# Patient Record
Sex: Male | Born: 1935 | Race: White | Hispanic: No | Marital: Single | Smoking: Never smoker
Health system: Southern US, Community
[De-identification: ages and names within clinical notes are randomized; demographics above are authoritative.]

## PROBLEM LIST (undated history)

## (undated) DIAGNOSIS — Z950 Presence of cardiac pacemaker: Secondary | ICD-10-CM

## (undated) DIAGNOSIS — R7989 Other specified abnormal findings of blood chemistry: Secondary | ICD-10-CM

## (undated) DIAGNOSIS — K4090 Unilateral inguinal hernia, without obstruction or gangrene, not specified as recurrent: Secondary | ICD-10-CM

## (undated) DIAGNOSIS — S72009A Fracture of unspecified part of neck of unspecified femur, initial encounter for closed fracture: Secondary | ICD-10-CM

## (undated) DIAGNOSIS — I442 Atrioventricular block, complete: Secondary | ICD-10-CM

## (undated) DIAGNOSIS — M47816 Spondylosis without myelopathy or radiculopathy, lumbar region: Secondary | ICD-10-CM

## (undated) DIAGNOSIS — D649 Anemia, unspecified: Secondary | ICD-10-CM

## (undated) DIAGNOSIS — I10 Essential (primary) hypertension: Secondary | ICD-10-CM

## (undated) DIAGNOSIS — N390 Urinary tract infection, site not specified: Secondary | ICD-10-CM

## (undated) DIAGNOSIS — J189 Pneumonia, unspecified organism: Secondary | ICD-10-CM

## (undated) DIAGNOSIS — M1990 Unspecified osteoarthritis, unspecified site: Secondary | ICD-10-CM

## (undated) DIAGNOSIS — R55 Syncope and collapse: Secondary | ICD-10-CM

## (undated) DIAGNOSIS — J449 Chronic obstructive pulmonary disease, unspecified: Secondary | ICD-10-CM

## (undated) DIAGNOSIS — F0390 Unspecified dementia without behavioral disturbance: Secondary | ICD-10-CM

## (undated) DIAGNOSIS — R251 Tremor, unspecified: Secondary | ICD-10-CM

## (undated) DIAGNOSIS — H9190 Unspecified hearing loss, unspecified ear: Secondary | ICD-10-CM

## (undated) DIAGNOSIS — N400 Enlarged prostate without lower urinary tract symptoms: Secondary | ICD-10-CM

## (undated) DIAGNOSIS — K922 Gastrointestinal hemorrhage, unspecified: Secondary | ICD-10-CM

## (undated) HISTORY — DX: Fracture of unspecified part of neck of unspecified femur, initial encounter for closed fracture: S72.009A

## (undated) HISTORY — DX: Presence of cardiac pacemaker: Z95.0

## (undated) HISTORY — DX: Syncope and collapse: R55

## (undated) HISTORY — DX: Pneumonia, unspecified organism: J18.9

## (undated) HISTORY — DX: Unspecified osteoarthritis, unspecified site: M19.90

## (undated) HISTORY — DX: Tremor, unspecified: R25.1

## (undated) HISTORY — DX: Atrioventricular block, complete: I44.2

## (undated) HISTORY — DX: Other specified abnormal findings of blood chemistry: R79.89

## (undated) HISTORY — DX: Benign prostatic hyperplasia without lower urinary tract symptoms: N40.0

## (undated) HISTORY — PX: PACEMAKER INSERTION: SHX728

## (undated) HISTORY — DX: Gastrointestinal hemorrhage, unspecified: K92.2

## (undated) HISTORY — DX: Urinary tract infection, site not specified: N39.0

## (undated) HISTORY — PX: OTHER SURGICAL HISTORY: SHX169

## (undated) HISTORY — DX: Anemia, unspecified: D64.9

## (undated) HISTORY — DX: Essential (primary) hypertension: I10

## (undated) HISTORY — DX: Chronic obstructive pulmonary disease, unspecified: J44.9

## (undated) HISTORY — DX: Spondylosis without myelopathy or radiculopathy, lumbar region: M47.816

## (undated) HISTORY — DX: Unspecified dementia, unspecified severity, without behavioral disturbance, psychotic disturbance, mood disturbance, and anxiety: F03.90

## (undated) HISTORY — PX: HERNIA REPAIR: SHX51

---

## 1986-01-16 HISTORY — PX: CHOLECYSTECTOMY: SHX55

## 1988-01-17 HISTORY — PX: OTHER SURGICAL HISTORY: SHX169

## 1995-04-17 DIAGNOSIS — J449 Chronic obstructive pulmonary disease, unspecified: Secondary | ICD-10-CM

## 1995-04-17 DIAGNOSIS — J189 Pneumonia, unspecified organism: Secondary | ICD-10-CM

## 1995-04-17 DIAGNOSIS — D649 Anemia, unspecified: Secondary | ICD-10-CM

## 1995-04-17 DIAGNOSIS — M47816 Spondylosis without myelopathy or radiculopathy, lumbar region: Secondary | ICD-10-CM

## 1995-04-17 DIAGNOSIS — M1990 Unspecified osteoarthritis, unspecified site: Secondary | ICD-10-CM

## 1995-04-17 HISTORY — DX: Spondylosis without myelopathy or radiculopathy, lumbar region: M47.816

## 1995-04-17 HISTORY — PX: OTHER SURGICAL HISTORY: SHX169

## 1995-04-17 HISTORY — DX: Pneumonia, unspecified organism: J18.9

## 1995-04-17 HISTORY — DX: Anemia, unspecified: D64.9

## 1995-04-17 HISTORY — DX: Unspecified osteoarthritis, unspecified site: M19.90

## 1995-04-17 HISTORY — PX: VENTRAL HERNIA REPAIR: SHX424

## 1995-04-17 HISTORY — PX: PULMONARY EMBOLISM SURGERY: SHX752

## 1995-04-17 HISTORY — DX: Chronic obstructive pulmonary disease, unspecified: J44.9

## 1995-05-17 DIAGNOSIS — N390 Urinary tract infection, site not specified: Secondary | ICD-10-CM

## 1995-05-17 HISTORY — PX: OTHER SURGICAL HISTORY: SHX169

## 1995-05-17 HISTORY — DX: Urinary tract infection, site not specified: N39.0

## 1999-11-17 ENCOUNTER — Emergency Department (HOSPITAL_COMMUNITY): Admission: EM | Admit: 1999-11-17 | Discharge: 1999-11-17 | Payer: Self-pay | Admitting: Emergency Medicine

## 1999-11-17 ENCOUNTER — Encounter: Payer: Self-pay | Admitting: Emergency Medicine

## 2003-12-14 ENCOUNTER — Ambulatory Visit: Payer: Self-pay | Admitting: Internal Medicine

## 2004-12-31 ENCOUNTER — Emergency Department (HOSPITAL_COMMUNITY): Admission: EM | Admit: 2004-12-31 | Discharge: 2004-12-31 | Payer: Self-pay | Admitting: Family Medicine

## 2005-01-02 ENCOUNTER — Emergency Department (HOSPITAL_COMMUNITY): Admission: AD | Admit: 2005-01-02 | Discharge: 2005-01-02 | Payer: Self-pay | Admitting: Family Medicine

## 2006-04-27 ENCOUNTER — Ambulatory Visit: Payer: Self-pay | Admitting: Family Medicine

## 2006-07-26 ENCOUNTER — Encounter: Payer: Self-pay | Admitting: Family Medicine

## 2006-07-26 DIAGNOSIS — Z86718 Personal history of other venous thrombosis and embolism: Secondary | ICD-10-CM

## 2006-07-26 DIAGNOSIS — J449 Chronic obstructive pulmonary disease, unspecified: Secondary | ICD-10-CM

## 2006-07-27 ENCOUNTER — Ambulatory Visit: Payer: Self-pay | Admitting: Family Medicine

## 2006-07-27 DIAGNOSIS — D485 Neoplasm of uncertain behavior of skin: Secondary | ICD-10-CM

## 2006-07-30 ENCOUNTER — Ambulatory Visit: Payer: Self-pay | Admitting: Internal Medicine

## 2006-07-30 LAB — CONVERTED CEMR LAB
Bilirubin Urine: NEGATIVE
Glucose, Urine, Semiquant: NEGATIVE
Ketones, urine, test strip: NEGATIVE
Specific Gravity, Urine: 1.015
Urobilinogen, UA: NEGATIVE

## 2006-08-21 ENCOUNTER — Ambulatory Visit: Payer: Self-pay | Admitting: Family Medicine

## 2006-08-30 ENCOUNTER — Encounter: Payer: Self-pay | Admitting: Family Medicine

## 2007-06-19 ENCOUNTER — Ambulatory Visit: Payer: Self-pay | Admitting: Family Medicine

## 2007-06-19 DIAGNOSIS — L923 Foreign body granuloma of the skin and subcutaneous tissue: Secondary | ICD-10-CM

## 2007-06-24 ENCOUNTER — Ambulatory Visit: Payer: Self-pay | Admitting: Family Medicine

## 2007-07-18 ENCOUNTER — Encounter: Payer: Self-pay | Admitting: Family Medicine

## 2007-09-26 ENCOUNTER — Ambulatory Visit: Payer: Self-pay | Admitting: *Deleted

## 2007-09-26 ENCOUNTER — Ambulatory Visit: Payer: Self-pay | Admitting: Internal Medicine

## 2007-09-26 ENCOUNTER — Inpatient Hospital Stay (HOSPITAL_COMMUNITY): Admission: EM | Admit: 2007-09-26 | Discharge: 2007-09-30 | Payer: Self-pay | Admitting: Emergency Medicine

## 2007-09-26 DIAGNOSIS — F0390 Unspecified dementia without behavioral disturbance: Secondary | ICD-10-CM | POA: Insufficient documentation

## 2007-09-26 DIAGNOSIS — I10 Essential (primary) hypertension: Secondary | ICD-10-CM

## 2007-09-27 ENCOUNTER — Encounter: Payer: Self-pay | Admitting: Family Medicine

## 2007-09-27 ENCOUNTER — Ambulatory Visit: Payer: Self-pay | Admitting: Vascular Surgery

## 2007-09-27 ENCOUNTER — Encounter: Payer: Self-pay | Admitting: Internal Medicine

## 2007-09-27 HISTORY — PX: OTHER SURGICAL HISTORY: SHX169

## 2007-09-30 ENCOUNTER — Encounter: Payer: Self-pay | Admitting: Family Medicine

## 2007-10-04 ENCOUNTER — Encounter: Payer: Self-pay | Admitting: Family Medicine

## 2007-10-07 ENCOUNTER — Encounter: Payer: Self-pay | Admitting: Family Medicine

## 2007-10-08 ENCOUNTER — Ambulatory Visit: Payer: Self-pay | Admitting: Family Medicine

## 2007-10-14 ENCOUNTER — Encounter: Payer: Self-pay | Admitting: Family Medicine

## 2007-10-17 ENCOUNTER — Ambulatory Visit: Payer: Self-pay

## 2007-10-28 ENCOUNTER — Ambulatory Visit: Payer: Self-pay

## 2007-12-24 ENCOUNTER — Ambulatory Visit: Payer: Self-pay | Admitting: Internal Medicine

## 2008-01-07 ENCOUNTER — Ambulatory Visit: Payer: Self-pay | Admitting: Family Medicine

## 2008-01-07 DIAGNOSIS — R609 Edema, unspecified: Secondary | ICD-10-CM | POA: Insufficient documentation

## 2008-01-07 DIAGNOSIS — N401 Enlarged prostate with lower urinary tract symptoms: Secondary | ICD-10-CM

## 2008-01-07 DIAGNOSIS — G4700 Insomnia, unspecified: Secondary | ICD-10-CM | POA: Insufficient documentation

## 2008-01-08 ENCOUNTER — Ambulatory Visit: Payer: Self-pay | Admitting: Family Medicine

## 2008-01-08 DIAGNOSIS — E7800 Pure hypercholesterolemia, unspecified: Secondary | ICD-10-CM

## 2008-01-08 DIAGNOSIS — M109 Gout, unspecified: Secondary | ICD-10-CM | POA: Insufficient documentation

## 2008-01-08 LAB — CONVERTED CEMR LAB
Alkaline Phosphatase: 98 units/L (ref 39–117)
Basophils Absolute: 0 10*3/uL (ref 0.0–0.1)
Bilirubin, Direct: 0.1 mg/dL (ref 0.0–0.3)
Calcium: 9.9 mg/dL (ref 8.4–10.5)
Cholesterol: 208 mg/dL (ref 0–200)
GFR calc Af Amer: 103 mL/min
Glucose, Bld: 97 mg/dL (ref 70–99)
HCT: 46.3 % (ref 39.0–52.0)
Hemoglobin: 16.2 g/dL (ref 13.0–17.0)
Lymphocytes Relative: 21.9 % (ref 12.0–46.0)
MCHC: 34.9 g/dL (ref 30.0–36.0)
Monocytes Absolute: 0.9 10*3/uL (ref 0.1–1.0)
Monocytes Relative: 10.1 % (ref 3.0–12.0)
Neutro Abs: 5.6 10*3/uL (ref 1.4–7.7)
PSA: 7.27 ng/mL — ABNORMAL HIGH (ref 0.10–4.00)
Platelets: 242 10*3/uL (ref 150–400)
Potassium: 5.2 meq/L — ABNORMAL HIGH (ref 3.5–5.1)
RDW: 13.7 % (ref 11.5–14.6)
Sodium: 140 meq/L (ref 135–145)
TSH: 3.71 microintl units/mL (ref 0.35–5.50)
Total Bilirubin: 1.2 mg/dL (ref 0.3–1.2)
Total CHOL/HDL Ratio: 6.2
Triglycerides: 164 mg/dL — ABNORMAL HIGH (ref 0–149)
Uric Acid, Serum: 6.4 mg/dL (ref 4.0–7.8)
VLDL: 33 mg/dL (ref 0–40)

## 2008-01-30 ENCOUNTER — Ambulatory Visit: Payer: Self-pay | Admitting: Family Medicine

## 2008-01-30 LAB — CONVERTED CEMR LAB
BUN: 20 mg/dL (ref 6–23)
Chloride: 105 meq/L (ref 96–112)
GFR calc Af Amer: 91 mL/min
GFR calc non Af Amer: 75 mL/min
Potassium: 4.2 meq/L (ref 3.5–5.1)
Sodium: 142 meq/L (ref 135–145)

## 2008-02-06 ENCOUNTER — Ambulatory Visit: Payer: Self-pay | Admitting: Family Medicine

## 2008-02-17 ENCOUNTER — Telehealth: Payer: Self-pay | Admitting: Family Medicine

## 2008-02-26 ENCOUNTER — Encounter: Payer: Self-pay | Admitting: Internal Medicine

## 2008-05-07 ENCOUNTER — Ambulatory Visit: Payer: Self-pay | Admitting: Family Medicine

## 2008-05-07 DIAGNOSIS — M79609 Pain in unspecified limb: Secondary | ICD-10-CM

## 2008-09-28 ENCOUNTER — Encounter: Payer: Self-pay | Admitting: Internal Medicine

## 2008-09-28 ENCOUNTER — Ambulatory Visit: Payer: Self-pay

## 2008-11-03 ENCOUNTER — Ambulatory Visit: Payer: Self-pay | Admitting: Internal Medicine

## 2008-11-03 DIAGNOSIS — I472 Ventricular tachycardia, unspecified: Secondary | ICD-10-CM | POA: Insufficient documentation

## 2008-12-07 ENCOUNTER — Ambulatory Visit: Payer: Self-pay | Admitting: Family Medicine

## 2009-05-13 ENCOUNTER — Telehealth: Payer: Self-pay | Admitting: Family Medicine

## 2009-06-15 ENCOUNTER — Telehealth: Payer: Self-pay | Admitting: Family Medicine

## 2009-07-20 ENCOUNTER — Encounter (INDEPENDENT_AMBULATORY_CARE_PROVIDER_SITE_OTHER): Payer: Self-pay | Admitting: *Deleted

## 2009-07-20 ENCOUNTER — Telehealth: Payer: Self-pay | Admitting: Family Medicine

## 2009-08-19 ENCOUNTER — Encounter (INDEPENDENT_AMBULATORY_CARE_PROVIDER_SITE_OTHER): Payer: Self-pay | Admitting: *Deleted

## 2009-08-30 ENCOUNTER — Ambulatory Visit: Payer: Self-pay | Admitting: Family Medicine

## 2009-08-30 DIAGNOSIS — R259 Unspecified abnormal involuntary movements: Secondary | ICD-10-CM | POA: Insufficient documentation

## 2010-02-02 ENCOUNTER — Ambulatory Visit
Admission: RE | Admit: 2010-02-02 | Discharge: 2010-02-02 | Payer: Self-pay | Source: Home / Self Care | Attending: Internal Medicine | Admitting: Internal Medicine

## 2010-02-02 ENCOUNTER — Encounter: Payer: Self-pay | Admitting: Internal Medicine

## 2010-02-15 NOTE — Progress Notes (Signed)
Summary: refill request for finasteride  Phone Note Refill Request Message from:  Fax from Pharmacy  Refills Requested: Medication #1:  PROSCAR 5 MG TABS one tab by mouth once daily   Last Refilled: 02/10/2009 Faxed request from [place].  Initial call taken by: Lowella Petties CMA,  May 13, 2009 5:05 PM    Prescriptions: PROSCAR 5 MG TABS (FINASTERIDE) one tab by mouth once daily  #30 x 12   Entered and Authorized by:   Shaune Leeks MD   Signed by:   Shaune Leeks MD on 05/13/2009   Method used:   Electronically to        [facility]* (retail)       [address]       [place], [place]  [address]       Ph: [phone]       Fax: [phone]   RxID:   [id number]

## 2010-02-15 NOTE — Letter (Signed)
Summary: Nadara Eaton letter  [place] at [facility]  [address] [place], [place] [address]   Phone: [phone]  Fax: [phone]       08/19/2009 MRN: [id number]  JOHNRYAN SAO [address] [place], [place]  [address]  Dear Mr. KRAS,  [facility], and [facility] announce the retirement of Arta Silence, M.D., from full-time practice at the [facility] office effective July 15, 2009 and his plans of returning part-time.  It is important to Dr. Hetty Ely and to our practice that you understand that [facility] has seven physicians in our office for your health care needs.  We will continue to offer the same exceptional care that you have today.    Dr. Hetty Ely has spoken to many of you about his plans for retirement and returning part-time in the fall.   We will continue to work with you through the transition to schedule appointments for you in the office and meet the high standards that [facility] is committed to.   Again, it is with great pleasure that we share the news that Dr. Hetty Ely will return to [facility] in October of 2011 with a reduced schedule.    If you have any questions, or would like to request an appointment with one of our physicians, please call us at [phone] and press the option for Scheduling an appointment.  We take pleasure in providing you with excellent patient care and look forward to seeing you at your next office visit.  Our [facility] Physicians are:  Tillman Abide, M.D. Laurita Quint, M.D. Roxy Manns, M.D. Kerby Nora, M.D. Hannah Beat, M.D. Ruthe Mannan, M.D. We proudly welcomed Raechel Ache, M.D. and Eustaquio Boyden, M.D. to the practice in July/August 2011.  Sincerely,  [facility]

## 2010-02-15 NOTE — Letter (Signed)
Summary: Generic Letter  [place] at [facility]  [address] [place], [place] [address]   Phone: [phone]  Fax: [phone]    07/20/2009    LARIN WEISSBERG [address] [place], [place]  [address]    Dear Mr. TRAMELL,  We have received requests for your medication to be filled from your pharmacy.  You must schedule an appointment before further refills can be authorized.  Please call [phone] for an appointment.  Your medication has been refilled for the next 30 days only.   Sincerely,   Lugene Fuquay CMA (AAMA)

## 2010-02-15 NOTE — Assessment & Plan Note (Signed)
Summary: Seth Hunter FROM [facility]   Vital Signs:  Patient profile:   77 year old male Height:      71 inches Weight:      187 pounds BMI:     26.18 Temp:     97.8 degrees F oral Pulse rate:   84 / minute Pulse rhythm:   regular BP sitting:   132 / 88  (left arm) Cuff size:   regular  Vitals Entered By: Delilah Shan CMA Kayan Blissett Dull) (August 30, 2009 11:31 AM) CC: Transfer from [facility]   History of Present Illness: Edema.  Controlled with HCTZ.  Doing well w/o complaints.  compliant.  Dec in edema on med.  BPH. UOP improved on meds w/o adverse effect.  D/w patient today about checking PSA.  Given age, I would not continue to check this.  He (and family members) understood.   Allergies: No Known Drug Allergies  Past History:  Past Medical History: COPD: 04/1995  Syncope in the setting of complete heart block status post permanent pacemaker placement on September 27, 2007. Medtronic Versa [id number] Hypertension Dementia- thought this appears to be mild based on reports of family as of 8/11 BPH  Family History: Reviewed history from 07/26/2006 and no changes required. Father: dead "hardening of the arteries", "heart trouble." Mother:  dead at 66, "old age"  Social History: Reviewed history and no changes required. Lives alone, doesn't drive,  Niece helps with that.  Does his own cooking, cleaning.   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  No kids.   Seth Hunter  Physical Exam  General:  GEN: nad, alert and oriented, hard of hearing HEENT: mucous membranes moist NECK: supple w/o LA CV: rrr. pacer in place on L chest wall PULM: ctab, no inc wob ABD: soft, +bs EXT: no edema SKIN: no acute rash    Impression & Recommendations:  Problem # 1:  EDEMA (ICD-782.3)  No change in meds.  Return for labs.   His updated medication list for this problem includes:    Hydrochlorothiazide 12.5 Mg Tabs (Hydrochlorothiazide) ..... One tab by mouth in am  Orders: Prescription Created Electronically [phone])  Problem  # 2:  BENIGN PROSTATIC HYPERTROPHY, WITH OBSTRUCTION (ICD-600.01)  Doing well. I would not check PSA at this point.  No change in meds.    Orders: Prescription Created Electronically [phone])  Complete Medication List: 1)  Garlic 400 Mg Tbec (Garlic) .Marland Kitchen.. 1 daily by mouth 2)  Adult Aspirin Low Strength 81 Mg Tbdp (Aspirin) .Marland Kitchen.. 1 daily by mouth 3)  Hydrochlorothiazide 12.5 Mg Tabs (Hydrochlorothiazide) .... One tab by mouth in am 4)  Proscar 5 Mg Tabs (Finasteride) .... One tab by mouth once daily 5)  Voltaren 1 % Gel (Diclofenac sodium) .... Apply to hands two times a day as needed fo arthritic pai.  Patient Instructions: 1)  Return for fasting labs.   2)  BMET, lipid--- 782.3 3)  We'll contact you with your lab report.  4)  Take care.  Prescriptions: PROSCAR 5 MG TABS (FINASTERIDE) one tab by mouth once daily  #90 x 3   Entered and Authorized by:   Crawford Givens MD   Signed by:   Crawford Givens MD on 08/30/2009   Method used:   Electronically to        [facility]* (retail)       [address]       [place], [place]  [address]       Ph: [phone]       Fax: [phone]  RxID:   [id number] HYDROCHLOROTHIAZIDE 12.5 MG TABS (HYDROCHLOROTHIAZIDE) one tab by mouth in AM  #90 x 3   Entered and Authorized by:   Crawford Givens MD   Signed by:   Crawford Givens MD on 08/30/2009   Method used:   Electronically to        [facility]* (retail)       [address]       [place], [place]  [address]       Ph: [phone]       Fax: [phone]   RxID:   [id number]   Current Allergies (reviewed today): No known allergies   Appended Document: [facility]    Clinical Lists Changes  Problems: Added new problem of TREMOR (ICD-781.0) Assessed TREMOR as comment only - Pt is not bothered enough by tremor to start meds.  This has been going on for years per patient.  He does have a family history of what sounds to be benign familiar tremor.  I would observe this  and treat if progressive or debilitating. Observations: Added new observation of PEADULT: Crawford Givens MD ~General`Gen appear (08/30/2009 13:51) Added new observation of GEN APPEAR: tremor noted during exam.  It fluctuates.  More pronounced on bilateral upper extremities compared to bilateral lower extremities.  Some truncal movement appreciated.  No cogwheeling.   (08/30/2009 13:51) Added new observation of PAST MED HX: COPD: 04/1995  Syncope in the setting of complete heart block status post permanent pacemaker placement on September 27, 2007. Medtronic Versa [id number] Hypertension Dementia- thought this appears to be mild based on reports of family as of 8/11 BPH tremor (08/30/2009 13:51)       Past History:  Past Medical History: COPD: 04/1995  Syncope in the setting of complete heart block status post permanent pacemaker placement on September 27, 2007. Medtronic Versa [id number] Hypertension Dementia- thought this appears to be mild based on reports of family as of 8/11 BPH tremor   Physical Exam  General:  tremor noted during exam.  It fluctuates.  More pronounced on bilateral upper extremities compared to bilateral lower extremities.  Some truncal movement appreciated.  No cogwheeling.     Impression & Recommendations:  Problem # 1:  TREMOR (ICD-781.0) Pt is not bothered enough by tremor to start meds.  This has been going on for years per patient.  He does have a family history of what sounds to be benign familiar tremor.  I would observe this and treat if progressive or debilitating.   Complete Medication List: 1)  Garlic 400 Mg Tbec (Garlic) .Marland Kitchen.. 1 daily by mouth 2)  Adult Aspirin Low Strength 81 Mg Tbdp (Aspirin) .Marland Kitchen.. 1 daily by mouth 3)  Hydrochlorothiazide 12.5 Mg Tabs (Hydrochlorothiazide) .... One tab by mouth in am 4)  Proscar 5 Mg Tabs (Finasteride) .... One tab by mouth once daily 5)  Voltaren 1 % Gel (Diclofenac sodium) .... Apply to hands two times a day as  needed fo arthritic pai.

## 2010-02-15 NOTE — Progress Notes (Signed)
Summary: Rx HCTZ  Phone Note Refill Request Call back at [phone] Message from:  [facility] on July 20, 2009 1:54 PM  Refills Requested: Medication #1:  HYDROCHLOROTHIAZIDE 12.5 MG TABS one tab by mouth in AM   Last Refilled: 06/15/2009 Patient has not been seen in over a year. Patient was notified that he needs office visit and lab appt for further refills. No appt. scheduled.   Method Requested: Electronic Initial call taken by: Sydell Axon LPN,  July 21, 4538 1:55 PM  Follow-up for Phone Call        If patient is scheduled for appointment, please fill rx up to that point.  Follow-up by: Crawford Givens MD,  July 20, 2009 2:16 PM  Additional Follow-up for Phone Call Additional follow up Details #1::        Left message on voicemail again to please schedule appointment.  Letter mailed also. Additional Follow-up by: Delilah Shan CMA (AAMA),  July 20, 2009 2:25 PM

## 2010-02-15 NOTE — Progress Notes (Signed)
Summary: HCTZ  Phone Note Refill Request Message from:  Fax from Pharmacy on Jun 15, 2009 10:36 AM  Refills Requested: Medication #1:  HYDROCHLOROTHIAZIDE 12.5 MG TABS one tab by mouth in AM Patient has not had an OV since April 2010.  Does he need OV?  [facility]   Method Requested: Electronic Initial call taken by: Delilah Shan CMA Duncan Dull),  Jun 15, 2009 10:36 AM  Follow-up for Phone Call        Yes. Can give one month with the understanding he needs to be seen and needs labwork as well. Follow-up by: Shaune Leeks MD,  Jun 15, 2009 1:23 PM  Additional Follow-up for Phone Call Additional follow up Details #1::        Left message on voicemail in detail.  Personalized VM.  Medication phoned to pharmacy.  Additional Follow-up by: Delilah Shan CMA Duncan Dull),  Jun 15, 2009 3:23 PM

## 2010-02-17 NOTE — Assessment & Plan Note (Signed)
Summary: rov. appt is 10:45. g   Visit Type:  PPM-Medtronic Primary Provider:  Hetty Ely  CC:  a lot of joint pain and and some SOB.  History of Present Illness: Mr. Seth Hunter is seen in followup syncope in the setting of complete heart block. 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 1 Daily By Mouth 2)  Adult Aspirin Low Strength 81 Mg  Tbdp (Aspirin) .Marland Kitchen.. 1 Daily By Mouth 3)  Hydrochlorothiazide 12.5 Mg Tabs (Hydrochlorothiazide) .... One Tab By Mouth in Am 4)  Proscar 5 Mg Tabs (Finasteride) .... One Tab By Mouth Once Daily 5)  Voltaren 1 % Gel (Diclofenac Sodium) .... Apply To Hands Two Times A Day As Needed Fo Arthritic Pai.  Allergies (verified): No Known Drug Allergies  Past History:  Past Medical History: Last updated: 09-01-2009 COPD: 04/1995  Syncope in the setting of complete heart block status post permanent pacemaker placement on September 27, 2007. Medtronic Versa [id number] Hypertension Dementia- thought this appears to be mild based on reports of family as of 8/11 BPH tremor  Past Surgical History: Last updated:  10/01/2007 CHOLECYSTECTOMY:(1988) LEFT INGUINAL HERNIA REPAIR (YEARS AGO) VENTRAL HERNIA REPAIR WITH INTRA ABD. 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 LEFT WRIST (1990) WITH OLD RIGHT POSTERIOR RIB FX. HOSP Syncope Comp Heart Block  Perm Pacer Placed Htn Dementia Mildly Elev TSH  9/10-9/14/2009 Pacer Placement (Dr [name]) 09/27/2007 CT Angio Chest No PE  No Acute Prob  09/27/07  Family History: Last updated: 09/01/09 Father: dead "hardening of the arteries", "heart trouble." Mother:  dead at 59, "old age"  Social History: Last updated: September 01, 2009 Lives alone, doesn't drive,  Niece helps with that.  Does his own cooking, cleaning.   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 HEENT Normal.  Neck veins were flat, carotids were brisk.  Lungs were clear.  Heart sounds were regular without murmurs or gallops.  Abdomen was soft with active bowel sounds. There is no clubbing cyanosis or edema. Skin Warm and dry woujnd well healed   PPM Specifications Following MD:  Sherryl Manges, MD     PPM Vendor:  Medtronic     PPM Model Number:   [id number]     PPM Serial Number:  [id number] PPM DOI:  09/27/2007     PPM Implanting MD:  Sherryl Manges, MD  Lead 1    Location: RA     DOI: 09/27/2007     Model #: [id number]     Serial #: [id number]     Status: active Lead 2    Location: RV     DOI: 09/27/2007     Model #: 6213     Serial #: [id number]     Status: active  Magnet Response Rate:  BOL 85 ERI  65  Indications:  Intermittent CHB; syncope  Explantation Comments:  Pacemaker dependent  PPM Follow Up Remote Check?  No Battery Voltage:  2.79 V     Battery Est. Longevity:  8.5 years     Pacer Dependent:  Yes       PPM Device Measurements Atrium  Amplitude: 5.6 mV, Impedance: 466 ohms, Threshold: 0.5 V at 0.4 msec Right Ventricle  Impedance: 455 ohms, Threshold: 1.0 V at 0.4 msec  Episodes MS Episodes:  5     Percent Mode Switch:  <0.1%     Coumadin:  No Ventricular High Rate:  1     Atrial Pacing:  20.7%     Ventricular Pacing:  97.9%  Parameters Mode:  DDDR     Lower Rate Limit:  60     Upper Rate Limit:  130 Paced AV Delay:  300     Sensed AV Delay:  300 Rate Response Parameters:  ADL response-3, Exertion response-3, Threshold-Med/Low, Acceleration-30 seconds, Deceleration-Exercise Next Cardiology Appt Due:  07/17/2010 Tech Comments:  No parameter changes.  Device function normal.  No Carelink @ this time. 1VHR episode 6 beats VT lasting 4 seconds.  ROV 6 months clinic. Altha Harm, LPN  February 02, 2010 11:32 AM   Impression & Recommendations:  Problem # 1:  VENTRICULAR TACHYCARDIA (ICD-427.1) stablr His updated medication list for this problem includes:    Adult Aspirin Low Strength 81 Mg Tbdp (Aspirin) .Marland Kitchen... 1 daily by mouth  Problem # 2:  AV BLOCK, COMPLETE DEVICE DEPENDENT (ICD-426.0) stabel with pacer in place. 100% v pacing His updated medication list for this problem includes:    Adult Aspirin Low Strength 81 Mg Tbdp (Aspirin) .Marland Kitchen... 1 daily by mouth  Problem # 3:  CARDIAC PACEMAKER-MEDTRONIC VERSA VEDR01  (ICD-V45.01) Device parameters and data were reviewed and no changes were made  Appended Document: rov. appt is 10:45. g    Clinical Lists Changes  Observations: Added new observation of PI CARDIO: Your physician recommends that you continue on your current medications as directed. Please refer to the Current Medication list given to you today. Your physician wants you to follow-up in: 6 months with Coumadin Clinic and 12 months with Dr. Graciela Husbands.    You will receive a reminder letter in the mail two months in advance. If you don't receive a letter, please call our office to schedule the follow-up appointment. (02/02/2010 12:01)       Patient Instructions: 1)  Your physician recommends that you continue on your current medications as directed. Please refer  to the Current Medication list given to you today. 2)  Your physician wants you to follow-up in: 6 months with Coumadin Clinic and 12 months with Dr. Graciela Husbands.    You will receive a reminder letter in the mail two months in advance. If you don't receive a letter, please call our office to schedule the follow-up appointment.

## 2010-02-17 NOTE — Cardiovascular Report (Signed)
Summary: Office Visit   Office Visit   Imported By: Roderic Ovens 02/07/2010 11:12:36  _____________________________________________________________________  External Attachment:    Type:   Image     Comment:   External Document

## 2010-05-31 NOTE — H&P (Signed)
NAME:  Seth Hunter, Seth Hunter NO.:  [id number]   MEDICAL RECORD NO.:  [id number]          PATIENT TYPE:  INP   LOCATION:  1843                         FACILITY:  [facility]   PHYSICIAN:  Michiel Cowboy, MDDATE OF BIRTH:  [birth year]   DATE OF ADMISSION:  09/26/2007  DATE OF DISCHARGE:                              HISTORY & PHYSICAL   PRIMARY CARE [place] Ke:  Arta Silence, MD   CHIEF COMPLAINT:  Syncope.   HISTORY OF PRESENT ILLNESS:  The patient is an 77 year old gentleman  with history of dementia and possibly hyperlipidemia, who was unable to  provide a full story for me.  No relatives at bedside.  He cannot recall  what was the last thing he remembers.  Apparently the patient has fallen  at home, which was unwitnessed.  At first he was noted to be poorly  responsive, but then quickly apparently came about.  They brought him to  the fire department, when it was noted that his heart rate was abnormal,  but not sure what and blood pressure also very elevated.  He was brought  in to the ED for further evaluation.  The patient denies any of this.  Cannot remember what happened.  Per family, he does not have any medical  problems, but takes aspirin and garlic.  In the emergency department he  was noted to be in mild heart failure, received Lasix with good  diuresis.  Of note, the patient likely suffered a head trauma when he  fell, because there is a scab on his head.  Unsure if this was secondary  to this fall a prior fall.   PAST MEDICAL HISTORY:  Dementia.   SOCIAL HISTORY:  The patient denies ever smoking, drinking.  Lives at  home, possibly alone although not sure since the family is not there   ALLERGIES:  TO PENICILLIN.   MEDICATIONS:  Aspirin 81 mg per day and garlic.   FAMILY HISTORY:  Noncontributory and unable to obtain.   PHYSICAL EXAMINATION:  VITAL SIGNS:  Temperature 98.1, blood pressure  155/87, pulse 97, respirations 15, satting 98% on room  air.  The patient  appears to be in no acute distress, sitting down on the bed.  HEAD:  There is a scab on the back of the head, but currently seems  nontender.  No edema noted around the lesion.  By the looks of it, this  could have happened a few days back.  NECK:  Supple.  No lymphadenopathy, no tenderness.  Moist mucous  membranes.  LUNGS:  Some crackles at the bases, but otherwise good air movement.  HEART:  Regular rate and rhythm.  No murmurs could be appreciated.  ABDOMEN:  There is some mild distention.  The patient unable to say if  he had any diarrhea or constipation.  LOWER EXTREMITIES:  No edema, but both feet are very cold.  No pulses  palpable in the feet.  No cyanosis, though,  noted.   LABS:  White blood cell count 7.5, hemoglobin 16, sodium 142, potassium  4.0, creatinine 0.9.  LFTs within normal limits, cardiac enzymes are  within normal limits.  BNP 111.  UA within normal limits.  EKG, showing  wide QRS.  Heart rate 77.  While on telemetry, it was noted that the  patient had occasional runs of heart rate down to 40s.   A CT scan of the head showing sinusitis and ischemic changes which are  old, but nothing acute.  CT scan of the neck showed degenerative  changes, but no vertebral fracture.  Chest x-ray showing bilateral  infiltrates/or edema, which is very mild.   ASSESSMENT AND PLAN:  This is a 77 year old gentleman with syncope.  1. Syncope.  Wonder if this is cardiogenic, as the patient does have      abnormal electrocardiogram.  Will put on telemetry, cycle cardiac      enzymes tried to assess risk factors with fasting lipid panel,      hemoglobin A1c.  Will check TSH.  Also the fact that the patient      was slightly bradycardic while I was examining him, makes me worry      if he has symptomatic bradycardia.  If this is documented on      telemetry overnight, would call cardiology consult and would      discuss this with family of the patient if a pacemaker  would be      something that they would consider.  Will follow BNP.  1. Possible congestive heart failure.  Will check a two-dimensional      echocardiogram, follow BNP, give gentle Lasix.  2. Possible frequent falls.  We will have physical      therapy/occupational therapy evaluation.  3. Prophylaxis:  Protonix plus Lovenox.  4. Questionable infiltrates.  For right now will hold off on      antibiotics as this could be possible edema and bilateral      infiltrates and the patient does not have a cough or white blood      cell count elevation.  Will repeat chest x-ray in a.m. once the      patient is fluid down, to see if we can further clarify this.  Dr.      Felicity Coyer to assume care in the morning.      Michiel Cowboy, MD  Electronically Signed     AVD/MEDQ  D:  09/26/2007  T:  09/26/2007  Job:  [id number]   cc:   Arta Silence, MD

## 2010-05-31 NOTE — Consult Note (Signed)
NAME:  Seth Hunter, Seth Hunter NO.:  [id number]   MEDICAL RECORD NO.:  [id number]          PATIENT TYPE:  INP   LOCATION:  4735                         FACILITY:  [facility]   PHYSICIAN:  Audery Amel, MD    DATE OF BIRTH:  September 24, 1918   DATE OF CONSULTATION:  09/27/2007  DATE OF DISCHARGE:                                 CONSULTATION   REASON FOR CONSULTATION:  Syncope.   HISTORY OF PRESENT ILLNESS:  Seth Hunter is an 77 year old white male with  a history of mild dementia who presented to the emergency department  this evening for further evaluation of syncopal symptoms.  The patient  is somewhat of a difficult historian.  However, he is alert and oriented  to his person, place and time.  Apparently, the patient fell this  evening while at home.  This was a witnessed event.  The patient did not  sustain any traumatic injury, and he was only transiently unaware of his  surroundings.  The patient was brought to [facility] for further  evaluation.  Initially in the emergency department, his EKG revealed  normal sinus rhythm with a nonspecific intraventricular conduction  block.  His initial cardiac biomarkers were negative times one.  Chest x-  ray did reveal bilateral pulmonary edema.  He was treated with diuresis.  He was seen by the hospitalist and admitted for further evaluation.  Early this a.m., approximately 3:30, he was noted to have bradycardia by  telemetry.  The heart rate in the 30s to 40s.  On my initial evaluation,  the patient was hemodynamically stable and asymptomatic.  An EKG was  obtained which revealed normal sinus rhythm with 2:1 Mobitz II AV block.  He denies any chest pain, shortness of breath or dyspnea on exertion.  He has bilateral lower extremity edema, graded at 1+, but denies any PND  or orthopnea.  He has occasional palpitations, but prior to this week,  had not experienced any presyncope or syncope symptoms.  The patient  does note an earlier this  week, on Tuesday while outside feeding his  cat, the patient again fell and has no recollection of event.  When he  came to, he was able to pick himself up and get back inside, but he did  not seek any medical attention at that time.  It sounds like the event  on Tuesday was very similar to the one that precipitated this admission.  Otherwise, he is without complaints.   PAST MEDICAL HISTORY:  1. Mild dementia.  2. GERD.  3. Questionable history of hyperlipidemia.   CURRENT MEDICATIONS:  1. Aspirin 81 mg daily.  2. Garlic tablets.   ALLERGIES:  NO KNOWN DRUG ALLERGIES.   SOCIAL HISTORY:  The patient was in [place] by himself.  He has  never been married.  He denies smoking, alcohol or any illicit  substances.   FAMILY HISTORY:  Noncontributory to his presentation.   REVIEW OF SYSTEMS:  As per HPI, otherwise complete review of systems was  negative except as documented.   PHYSICAL EXAMINATION:  VITAL  SIGNS:  Blood pressure 140/74, heart rate  is 40, O2 sats are 97% on 2 liters nasal cannula.  Temperature is 98.3.  GENERAL:  The patient is an elderly white male in no acute distress and  very pleasantly conversant.  He is alert and oriented to his person,  place, time.  HEENT:  Normocephalic, atraumatic.  EOMI, PERL, nares patent, OMP is  clear without erythema or exudate.  NECK:  Supple, full range of motion, no significant JVD.  His carotid  upstrokes are equal and symmetric bilaterally with no audible bruits.  No palpable thyromegaly or lymphadenopathy.  CHEST:  Bibasilar crackles.  CARDIOVASCULAR:  Brady S1-S2 with a 2/6 systolic murmur at the left  sternal border.  PMI is nonpalpable.  Peripheral pulses are 2+ and  symmetric.  ABDOMEN:  Soft, nontender, nondistended, positive bowel sounds.  No  hepatosplenomegaly.  EXTREMITIES:  Reveal 1+ bilateral lower extremity edema.  There is no  evidence of inflammation or ulceration.  NEUROLOGIC:  Grossly nonfocal.   Psychiatric appropriate insight and  judgment.   DATA REVIEWED:  EKG by my interpretation at 3:56 a.m. on September 11  reveals normal sinus rhythm with 2:1 AV block and evidence of an  interventricular conduction delay.  Suspect Mobitz II AV block.   LABORATORY DATA:  Troponin 0.02.  CK 83, CK-MB 2.3, BNP 111, sodium 138,  potassium 4.0, chloride 107, CO2 is 27, BUN 16, creatinine 0.9, glucose  99, white count 7.5, hematocrit 44.5, platelet count 249.   IMPRESSION:  1. Cardiac syncope secondary to the Mobitz II AV block.  2. Hypertension.  3. Mild dementia.   PLAN:  On examination of the patient's EKG early this morning, there is  evidence of 2:1 AV block.  Given that he does have evidence of  interventricular conduction delay, this suggests an infrahisian block  (Mobitz II).  He is currently asymptomatic and hemodynamically stable.  I recommend avoiding all AV nodal blocking agents at this time.  Given  that he is asymptomatic and hemodynamically stable, there is no  indication for an urgent transvenous pacemaker at this time.  He should  be continuously monitored on telemetry, and he will likely require a  permanent pacemaker placement prior to discharge.  Would recommend  checking a transthoracic echocardiogram to assess his left ventricular  structure and function.  Will continue aspirin 81 mg daily.  We  appreciate the opportunity to participate in the care of your patient.  Will follow along with you throughout this hospital course.  If there  are any additional questions, please feel free to contact us.      Audery Amel, MD  Electronically Signed     SHG/MEDQ  D:  09/27/2007  T:  09/27/2007  Job:  [id number]

## 2010-05-31 NOTE — Discharge Summary (Signed)
NAME:  Seth Hunter, POSTEMA NO.:  [id number]   MEDICAL RECORD NO.:  [id number]          PATIENT TYPE:  INP   LOCATION:  4735                         FACILITY:  [facility]   PHYSICIAN:  Valerie A. Felicity Coyer, MDDATE OF BIRTH:  05-18-1918   DATE OF ADMISSION:  09/26/2007  DATE OF DISCHARGE:  09/30/2007                               DISCHARGE SUMMARY   DISCHARGE DIAGNOSES:  1. Syncope in the setting of complete heart block status post      permanent pacemaker placement on September 27, 2007.  2. Hypertension.  3. Dementia.  4. Mildly elevated TSH.   HISTORY OF PRESENT ILLNESS:  Seth Hunter is an 77 year old white male who  was admitted on September 26, 2007, following a syncopal event.  He has  a history of dementia and possibly hyperlipidemia and was unable to  provide full story at the time of admission.  He apparently had fallen  at home, which was unwitnessed.  Apparently, he does not have any  medical problems according to the family.  He takes aspirin and garlics.  He was admitted for further evaluation and treatment.   COURSE OF HOSPITALIZATION:  1. Syncope in the setting of complete heart block.  The patient was      admitted and a cardiology consult was requested.  The patient was      seen in consultation by Dr. Gilman Schmidt of the EP team.  He      subsequently underwent permanent pacemaker placement on September 27, 2007.  Followup pacemaker interrogation on September 28, 2007,      showed normal device function.  His hospitalization has been      uneventful.  He was diuresed during this admission due to mild      heart failure on admission.  However as he has not had volume      issues in the past, I suspect this is likely related to the      complete heart block and severe bradycardia.  He is currently      euvolemic.  We will hold off on further diuresis at the time of      discharge; however, he will need close outpatient monitoring with      volume  status.  We will also send the home health RN to closely      evaluate the patient in the home.  The patient did have an elevated      D-dimer this admission, which prompted a CT angio of the chest.      This was performed on September 27, 2007, and although it was      technically limited exam, there was no evidence of acute PE or      other acute findings.  2. Mildly elevated TSH.  The patient will need followup thyroid      function testing in approximately 4-6 weeks to further evaluate a      very mildly elevated TSH level during this admission.   HOME MEDICATIONS AT THE TIME OF DISCHARGE:  1. Aspirin 81  mg p.o. daily.  2. MiraLax 17 g in 8 ounces of water once daily as needed for      constipation.  3. Garlic pill once daily.   DISPOSITION:  The patient will be discharged to home.  We will ask for  home health PT eval as well as home health RN.  We are waiting official  PT eval prior to discharge here this morning at the hospital for any  further recommendations.   PERTINENT LABORATORIES AT THE TIME OF DISCHARGE:  Urine culture 35,000  multiple morphotypes.  TSH 4.591, hemoglobin A1c 5.7.  Cardiac enzymes  negative.   FOLLOWUP:  The patient is scheduled to follow up with Dr. Hetty Ely on  October 08, 2007, at 3:45 p.m.  He is also scheduled to follow up in  [facility] on Thursday October 17, 2007, at  9:20 p.m. and the office will contact with an appointment to see Dr.  Graciela Husbands.      Sandford Craze, NP      Raenette Rover. Felicity Coyer, MD  Electronically Signed    MO/MEDQ  D:  09/30/2007  T:  10/01/2007  Job:  [id number]   cc:   Arta Silence, MD  Duke Salvia, MD, [facility]

## 2010-05-31 NOTE — Consult Note (Signed)
NAMEMarland Hunter  DONZELL, COLLER NO.:  [id number]   MEDICAL RECORD NO.:  [id number]          PATIENT TYPE:  INP   LOCATION:  4735                         FACILITY:  [facility]   PHYSICIAN:  Doylene Canning. [name], MD    DATE OF BIRTH:  July 03, 1918   DATE OF CONSULTATION:  DATE OF DISCHARGE:                                 CONSULTATION   INDICATION FOR CONSULTATION:  Regarding syncope in the setting of  intermittent complete heart block.   HISTORY OF PRESENT ILLNESS:  The patient is a very pleasant 77 year old  man who has a history of recent syncopal episode.  He did not  immediately seek medical attention, but when a friend came over to see  him, he was taken to the fire station.  EKG demonstrated a heart block  and he was told to go to [facility].  On presentation, he had one-to-  one AV conduction and no other additional complaints except for his  prior syncope.  On reflection, however, he does note that he has felt  more fatigued lately in the last several weeks.  He has also had  additional dizzy spells, though no other frank syncopal episodes other  than the spell that occurred on September 24, 2007.  The patient  subsequently was admitted to hospital with 2:1 heart block and a  ventricular rate of 40 beats per minute.  He has developed a higher  grade heart block early this morning with sinus rhythm and complete  heart block with a ventricular rate of 34 beats per minute.  At the time  this occurred, the patient was actually in bed asleep and did not know  that he had a problem.  His additional past medical history is notable  for hypertension.  There is very mild dementia.  He is followed by Dr.  Hetty Ely.   SOCIAL HISTORY:  The patient never married.  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  He denies alcohol use.  He lives in  [place] independently.  He has one niece who helps him with his  affairs.  His past medical history is as previously noted.   His family  history is remarkable in that his parents including his mother and great  grandmother lived to be over 53 years of age.  There is no premature  coronary disease in the family.  His review of systems is as noted in  the HPI.  He also notes that he has arthritis particularly in his left  foot and knee and hands.   Review of systems is also notable for constipation and intermittent  abdominal discomfort.  Otherwise, all systems reviewed and were  negative.   PHYSICAL EXAMINATION:  GENERAL:  He is a pleasant elderly-appearing man  in no acute distress.  VITAL SIGNS:  Blood pressure was 138/70, pulse was 40 and regular,  respirations were 20, and temperature is 98.  HEENT:  Normocephalic and atraumatic.  Pupils equal and round.  Oropharynx is moist.  Sclerae anicteric.  NECK:  No jugular venous distention.  There is no  thyromegaly.  Trachea  is midline.  Carotids are 2+ and symmetric.  LUNGS:  Clear bilaterally to auscultation.  No wheezes, rales, or  rhonchi.  There is no increased work of breathing.  CARDIAC:  Regular bradycardia with normal S1 and S2.  I did not  appreciate murmurs, rubs, or gallops today.  ABDOMEN:  Soft and nontender.  There is no organomegaly.  EXTREMITIES:  No cyanosis, clubbing, or edema.  The pulses were 2+ and  symmetric.  NEUROLOGIC:  Alert and oriented x3.  The patient's cranial nerves are  intact.  Strength was 5/5 and symmetric.   IMPRESSION:  1. Syncope.  2. Intermittent complete heart block.  3. Hypertension.  4. Very mild dementia.   DISCUSSION:  I discussed treatment options with the patient.  The risks,  benefits, goals, and expectations of the pacemaker insertion had been  discussed with him.  He would like for his niece to help some of his  affairs, to be present, and talk to [place] but we will tentatively plan on  proceeding with pacemaker for this patient later today.      Doylene Canning. [name], MD  Electronically Signed      GWT/MEDQ  D:  09/27/2007  T:  09/27/2007  Job:  [id number]   cc:   Arta Silence, MD

## 2010-08-30 ENCOUNTER — Encounter: Payer: Self-pay | Admitting: *Deleted

## 2010-09-30 ENCOUNTER — Other Ambulatory Visit: Payer: Self-pay | Admitting: *Deleted

## 2010-09-30 MED ORDER — FINASTERIDE 5 MG PO TABS: 5.0000 mg | ORAL_TABLET | Freq: Every day | ORAL | Status: DC

## 2010-10-03 ENCOUNTER — Other Ambulatory Visit: Payer: Self-pay | Admitting: *Deleted

## 2010-10-03 NOTE — Telephone Encounter (Signed)
I am not able to get into EMR.  This medication was not on his medication list in Epic.  I will try to get [facility] records.

## 2010-10-04 MED ORDER — HYDROCHLOROTHIAZIDE 12.5 MG PO TABS: 12.5000 mg | ORAL_TABLET | Freq: Every day | ORAL | Status: DC

## 2010-10-19 LAB — URINALYSIS, ROUTINE W REFLEX MICROSCOPIC
Nitrite: NEGATIVE
Specific Gravity, Urine: 1.012
pH: 6.5

## 2010-10-19 LAB — COMPREHENSIVE METABOLIC PANEL
ALT: 17
AST: 22
Alkaline Phosphatase: 98
CO2: 27
Calcium: 8.9
GFR calc Af Amer: >60
GFR calc non Af Amer: >60
Potassium: 4
Sodium: 138

## 2010-10-19 LAB — CBC
Hemoglobin: 14.9
MCHC: 33.5
RBC: 4.68
WBC: 7.5

## 2010-10-19 LAB — DIFFERENTIAL
Basophils Relative: 1
Eosinophils Absolute: 0.3
Eosinophils Relative: 4
Lymphs Abs: 1.7
Monocytes Relative: 10

## 2010-10-19 LAB — LIPID PANEL
Cholesterol: 186
LDL Cholesterol: 136 — ABNORMAL HIGH

## 2010-10-19 LAB — D-DIMER, QUANTITATIVE: D-Dimer, Quant: 0.98 — ABNORMAL HIGH

## 2010-10-19 LAB — PROTIME-INR: Prothrombin Time: 12.7

## 2010-10-19 LAB — POCT I-STAT, CHEM 8
Chloride: 106
Creatinine, Ser: 0.9
Glucose, Bld: 97
Potassium: 4

## 2010-10-19 LAB — URINE CULTURE

## 2010-10-19 LAB — CARDIAC PANEL(CRET KIN+CKTOT+MB+TROPI)
CK, MB: 3
Relative Index: 2.8 — ABNORMAL HIGH
Relative Index: INVALID
Total CK: 106
Troponin I: 0.02

## 2010-10-19 LAB — B-NATRIURETIC PEPTIDE (CONVERTED LAB): Pro B Natriuretic peptide (BNP): 111 — ABNORMAL HIGH

## 2010-10-19 LAB — URINE MICROSCOPIC-ADD ON

## 2010-10-19 LAB — TROPONIN I: Troponin I: 0.02

## 2010-10-19 LAB — HEMOGLOBIN A1C: Hgb A1c MFr Bld: 5.7

## 2010-10-19 LAB — CK TOTAL AND CKMB (NOT AT ARMC): CK, MB: 2.3

## 2010-10-19 LAB — POCT CARDIAC MARKERS: Troponin i, poc: <0.05

## 2011-02-07 ENCOUNTER — Other Ambulatory Visit: Payer: Self-pay | Admitting: *Deleted

## 2011-02-07 MED ORDER — FINASTERIDE 5 MG PO TABS: 5.0000 mg | ORAL_TABLET | Freq: Every day | ORAL | Status: DC

## 2011-02-07 NOTE — Telephone Encounter (Signed)
Caretaker advised. 

## 2011-02-07 NOTE — Telephone Encounter (Signed)
Ok to refill?  Patient not seen since 07/2009.

## 2011-02-07 NOTE — Telephone Encounter (Signed)
Schedule 30 min eval this spring.  90 day supply sent.  Thanks.

## 2011-02-28 DIAGNOSIS — C44621 Squamous cell carcinoma of skin of unspecified upper limb, including shoulder: Secondary | ICD-10-CM | POA: Diagnosis not present

## 2011-02-28 DIAGNOSIS — L570 Actinic keratosis: Secondary | ICD-10-CM | POA: Diagnosis not present

## 2011-02-28 DIAGNOSIS — D485 Neoplasm of uncertain behavior of skin: Secondary | ICD-10-CM | POA: Diagnosis not present

## 2011-03-10 ENCOUNTER — Encounter: Payer: Self-pay | Admitting: Internal Medicine

## 2011-03-10 ENCOUNTER — Ambulatory Visit (INDEPENDENT_AMBULATORY_CARE_PROVIDER_SITE_OTHER): Payer: Medicare Other | Admitting: Internal Medicine

## 2011-03-10 DIAGNOSIS — I10 Essential (primary) hypertension: Secondary | ICD-10-CM

## 2011-03-10 DIAGNOSIS — I442 Atrioventricular block, complete: Secondary | ICD-10-CM | POA: Diagnosis not present

## 2011-03-10 DIAGNOSIS — Z950 Presence of cardiac pacemaker: Secondary | ICD-10-CM

## 2011-03-10 DIAGNOSIS — I472 Ventricular tachycardia: Secondary | ICD-10-CM | POA: Diagnosis not present

## 2011-03-10 DIAGNOSIS — R609 Edema, unspecified: Secondary | ICD-10-CM

## 2011-03-10 LAB — PACEMAKER DEVICE OBSERVATION
AL AMPLITUDE: 2 mv
AL THRESHOLD: 0.875 V
BAMS-0001: 160 {beats}/min
RV LEAD IMPEDENCE PM: 450 Ohm
RV LEAD THRESHOLD: 1 V

## 2011-03-10 NOTE — Assessment & Plan Note (Signed)
The patient's device was interrogated.  The information was reviewed. No changes were made in the programming.    

## 2011-03-10 NOTE — Assessment & Plan Note (Signed)
Increase his diuretic on an as needed basis

## 2011-03-10 NOTE — Progress Notes (Signed)
  HPI  Seth Hunter is a 78 y.o. male is seen in followup syncope in the setting of complete heart block. He underwent pacemaker implantation about 3 years ago   He has swelling in his legs and arthritis   No recurrent syncope    Past Medical History  Diagnosis Date  . Syncope     Syncope in the setting of complete heart block status post permanent pacemaker placement on September 27, 2007  . Hypertension   . Dementia   . Elevated TSH     Mildly elevated TSH  . Atrioventricular block, complete   . Pacemaker -MDT     Past Surgical History  Procedure Date  . Pacemaker insertion     Current Outpatient Prescriptions  Medication Sig Dispense Refill  . finasteride (PROSCAR) 5 MG tablet Take 1 tablet (5 mg total) by mouth daily.  90 tablet  0  . hydrochlorothiazide (HYDRODIURIL) 12.5 MG tablet Take 1 tablet (12.5 mg total) by mouth daily.  90 tablet  3    Allergies  Allergen Reactions  . Penicillins     Review of Systems negative except from HPI and PMH  Physical Exam BP 144/93  Pulse 94  Ht 5\' 11"  (1.803 m)  Wt 193 lb (87.544 kg)  BMI 26.92 kg/m2 Well developed and well nourished in no acute distress although malodorous HENT normal E scleral and icterus clear Neck Supple JVP flat; carotids brisk and full Clear to ausculation Regular rate and rhythm,   Soft  No clubbing cyanosis 1+ Edema Alert and oriented, grossly normal motor and sensory function, fine tremor Skin Warm and Dry   Assessment and  Plan

## 2011-03-10 NOTE — Patient Instructions (Signed)
Your physician wants you to follow-up in: 6 months with Kristin/Paula for a device check & 1 year with Dr. Klein. You will receive a reminder letter in the mail two months in advance. If you don't receive a letter, please call our office to schedule the follow-up appointment.  Your physician recommends that you continue on your current medications as directed. Please refer to the Current Medication list given to you today.  

## 2011-03-10 NOTE — Assessment & Plan Note (Signed)
Reasonably controlled but with his edema will have him increase his diuretic

## 2011-03-10 NOTE — Assessment & Plan Note (Signed)
Device dependent and stay

## 2011-03-23 ENCOUNTER — Encounter: Payer: Self-pay | Admitting: Family Medicine

## 2011-03-28 ENCOUNTER — Encounter: Payer: Self-pay | Admitting: Family Medicine

## 2011-03-28 ENCOUNTER — Ambulatory Visit (INDEPENDENT_AMBULATORY_CARE_PROVIDER_SITE_OTHER): Payer: Medicare Other | Admitting: Family Medicine

## 2011-03-28 VITALS — BP 122/70 | HR 96 | Temp 97.7°F | Wt 189.0 lb

## 2011-03-28 DIAGNOSIS — N138 Other obstructive and reflux uropathy: Secondary | ICD-10-CM

## 2011-03-28 DIAGNOSIS — F0390 Unspecified dementia without behavioral disturbance: Secondary | ICD-10-CM

## 2011-03-28 DIAGNOSIS — I10 Essential (primary) hypertension: Secondary | ICD-10-CM

## 2011-03-28 DIAGNOSIS — Z7189 Other specified counseling: Secondary | ICD-10-CM

## 2011-03-28 DIAGNOSIS — N401 Enlarged prostate with lower urinary tract symptoms: Secondary | ICD-10-CM

## 2011-03-28 LAB — BASIC METABOLIC PANEL
BUN: 21 mg/dL (ref 6–23)
Calcium: 9.7 mg/dL (ref 8.4–10.5)
Chloride: 101 mEq/L (ref 96–112)
Creatinine, Ser: 1 mg/dL (ref 0.4–1.5)
GFR: 75.31 mL/min (ref >60.00)

## 2011-03-28 MED ORDER — HYDROCHLOROTHIAZIDE 12.5 MG PO TABS: ORAL_TABLET | ORAL | Status: DC

## 2011-03-28 MED ORDER — FINASTERIDE 5 MG PO TABS: 5.0000 mg | ORAL_TABLET | Freq: Every day | ORAL | Status: DC

## 2011-03-28 NOTE — Patient Instructions (Signed)
Take care.  I would think about a flu shot in the fall.  You can get your results through our phone system.  Follow the instructions on the blue card. Glad to see you.  Call with concerns.  Recheck BP in 12 months at visit with Para March.

## 2011-03-28 NOTE — Progress Notes (Signed)
He had some skin cancers taken off by Dr. Terri Piedra.    Living at home.  Family is checking on him daily.  He still cooks.  Not driving but still does grocery shopping.  No sig changes in memory per family.  He'll occ tell the same story twice but no sig deficits per family.  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  No falls.   Hypertension:    Using medication without problems or lightheadedness:  Chest pain with exertion: Edema: Short of breath: Average home BPs: Other issues: healthy diet.  We discussed checking cholesterol, but we decided against it.   He had f/u with cards re: pacer.  He's taking up to 2 HCTZ if needed for edema  I asked him to consider routine vaccinations.  He isn't interested but I asked him to consider the flu shot.   He declined PSA and prostate check today.  This was discussed.   Tremor noted in hands. +FH with mult with tremor.  Tremor is stable per family.    Meds, vitals, and allergies reviewed.   PMH and SH reviewed  ROS: See HPI.  Otherwise negative.    GEN: nad, alert and oriented, hard of hearing HEENT: mucous membranes moist NECK: supple w/o LA CV: rrr. Pacer noted on R upper chest wall.  PULM: ctab, no inc wob ABD: soft, +bs EXT: trace edema SKIN: no acute rash

## 2011-03-30 ENCOUNTER — Encounter: Payer: Self-pay | Admitting: Family Medicine

## 2011-03-30 DIAGNOSIS — Z789 Other specified health status: Secondary | ICD-10-CM | POA: Insufficient documentation

## 2011-03-30 NOTE — Assessment & Plan Note (Signed)
Continue meds, no need to check PSA.   This was discussed and he agreed, family agreed.

## 2011-03-30 NOTE — Assessment & Plan Note (Signed)
Continue current meds 

## 2011-03-30 NOTE — Assessment & Plan Note (Signed)
He's doing well and we'll follow clinically.

## 2011-04-11 DIAGNOSIS — C44621 Squamous cell carcinoma of skin of unspecified upper limb, including shoulder: Secondary | ICD-10-CM | POA: Diagnosis not present

## 2011-04-11 DIAGNOSIS — C44320 Squamous cell carcinoma of skin of unspecified parts of face: Secondary | ICD-10-CM | POA: Diagnosis not present

## 2011-04-11 DIAGNOSIS — D485 Neoplasm of uncertain behavior of skin: Secondary | ICD-10-CM | POA: Diagnosis not present

## 2011-04-11 DIAGNOSIS — Z85828 Personal history of other malignant neoplasm of skin: Secondary | ICD-10-CM | POA: Diagnosis not present

## 2011-05-23 DIAGNOSIS — Z85828 Personal history of other malignant neoplasm of skin: Secondary | ICD-10-CM | POA: Diagnosis not present

## 2011-05-23 DIAGNOSIS — L570 Actinic keratosis: Secondary | ICD-10-CM | POA: Diagnosis not present

## 2011-09-10 ENCOUNTER — Encounter (HOSPITAL_COMMUNITY): Payer: Self-pay | Admitting: Emergency Medicine

## 2011-09-10 ENCOUNTER — Emergency Department (HOSPITAL_COMMUNITY): Payer: No Typology Code available for payment source

## 2011-09-10 ENCOUNTER — Inpatient Hospital Stay (HOSPITAL_COMMUNITY)
Admission: EM | Admit: 2011-09-10 | Discharge: 2011-09-13 | DRG: 185 | Disposition: A | Discharge status: Skilled Nursing Facility | Payer: No Typology Code available for payment source | Attending: Family Medicine | Admitting: Family Medicine

## 2011-09-10 DIAGNOSIS — J449 Chronic obstructive pulmonary disease, unspecified: Secondary | ICD-10-CM | POA: Diagnosis not present

## 2011-09-10 DIAGNOSIS — S8000XA Contusion of unspecified knee, initial encounter: Secondary | ICD-10-CM | POA: Diagnosis not present

## 2011-09-10 DIAGNOSIS — Z79899 Other long term (current) drug therapy: Secondary | ICD-10-CM | POA: Diagnosis not present

## 2011-09-10 DIAGNOSIS — N401 Enlarged prostate with lower urinary tract symptoms: Secondary | ICD-10-CM | POA: Diagnosis present

## 2011-09-10 DIAGNOSIS — R0602 Shortness of breath: Secondary | ICD-10-CM | POA: Diagnosis not present

## 2011-09-10 DIAGNOSIS — Z86718 Personal history of other venous thrombosis and embolism: Secondary | ICD-10-CM

## 2011-09-10 DIAGNOSIS — M19019 Primary osteoarthritis, unspecified shoulder: Secondary | ICD-10-CM | POA: Diagnosis present

## 2011-09-10 DIAGNOSIS — Z950 Presence of cardiac pacemaker: Secondary | ICD-10-CM | POA: Diagnosis not present

## 2011-09-10 DIAGNOSIS — S2249XA Multiple fractures of ribs, unspecified side, initial encounter for closed fracture: Secondary | ICD-10-CM | POA: Diagnosis not present

## 2011-09-10 DIAGNOSIS — T50995A Adverse effect of other drugs, medicaments and biological substances, initial encounter: Secondary | ICD-10-CM | POA: Diagnosis present

## 2011-09-10 DIAGNOSIS — S298XXA Other specified injuries of thorax, initial encounter: Secondary | ICD-10-CM | POA: Diagnosis not present

## 2011-09-10 DIAGNOSIS — S0990XA Unspecified injury of head, initial encounter: Secondary | ICD-10-CM | POA: Diagnosis not present

## 2011-09-10 DIAGNOSIS — H9190 Unspecified hearing loss, unspecified ear: Secondary | ICD-10-CM | POA: Diagnosis present

## 2011-09-10 DIAGNOSIS — N138 Other obstructive and reflux uropathy: Secondary | ICD-10-CM | POA: Diagnosis present

## 2011-09-10 DIAGNOSIS — K5903 Drug induced constipation: Secondary | ICD-10-CM | POA: Diagnosis present

## 2011-09-10 DIAGNOSIS — R0902 Hypoxemia: Secondary | ICD-10-CM | POA: Diagnosis present

## 2011-09-10 DIAGNOSIS — R339 Retention of urine, unspecified: Secondary | ICD-10-CM | POA: Diagnosis present

## 2011-09-10 DIAGNOSIS — S8990XA Unspecified injury of unspecified lower leg, initial encounter: Secondary | ICD-10-CM | POA: Diagnosis not present

## 2011-09-10 DIAGNOSIS — I517 Cardiomegaly: Secondary | ICD-10-CM | POA: Diagnosis not present

## 2011-09-10 DIAGNOSIS — I472 Ventricular tachycardia: Secondary | ICD-10-CM

## 2011-09-10 DIAGNOSIS — R279 Unspecified lack of coordination: Secondary | ICD-10-CM | POA: Diagnosis not present

## 2011-09-10 DIAGNOSIS — R000 Tachycardia, unspecified: Secondary | ICD-10-CM | POA: Diagnosis not present

## 2011-09-10 DIAGNOSIS — M7989 Other specified soft tissue disorders: Secondary | ICD-10-CM | POA: Diagnosis not present

## 2011-09-10 DIAGNOSIS — M25559 Pain in unspecified hip: Secondary | ICD-10-CM | POA: Diagnosis not present

## 2011-09-10 DIAGNOSIS — S2232XA Fracture of one rib, left side, initial encounter for closed fracture: Secondary | ICD-10-CM | POA: Diagnosis present

## 2011-09-10 DIAGNOSIS — R918 Other nonspecific abnormal finding of lung field: Secondary | ICD-10-CM | POA: Diagnosis not present

## 2011-09-10 DIAGNOSIS — G4700 Insomnia, unspecified: Secondary | ICD-10-CM

## 2011-09-10 DIAGNOSIS — N400 Enlarged prostate without lower urinary tract symptoms: Secondary | ICD-10-CM | POA: Diagnosis not present

## 2011-09-10 DIAGNOSIS — R319 Hematuria, unspecified: Secondary | ICD-10-CM | POA: Diagnosis present

## 2011-09-10 DIAGNOSIS — M79609 Pain in unspecified limb: Secondary | ICD-10-CM

## 2011-09-10 DIAGNOSIS — S8002XA Contusion of left knee, initial encounter: Secondary | ICD-10-CM

## 2011-09-10 DIAGNOSIS — I10 Essential (primary) hypertension: Secondary | ICD-10-CM | POA: Diagnosis present

## 2011-09-10 DIAGNOSIS — IMO0001 Reserved for inherently not codable concepts without codable children: Secondary | ICD-10-CM | POA: Diagnosis not present

## 2011-09-10 DIAGNOSIS — E7800 Pure hypercholesterolemia, unspecified: Secondary | ICD-10-CM

## 2011-09-10 DIAGNOSIS — S0993XA Unspecified injury of face, initial encounter: Secondary | ICD-10-CM | POA: Diagnosis not present

## 2011-09-10 DIAGNOSIS — K5909 Other constipation: Secondary | ICD-10-CM | POA: Diagnosis not present

## 2011-09-10 DIAGNOSIS — D485 Neoplasm of uncertain behavior of skin: Secondary | ICD-10-CM

## 2011-09-10 DIAGNOSIS — J984 Other disorders of lung: Secondary | ICD-10-CM | POA: Diagnosis not present

## 2011-09-10 DIAGNOSIS — S2239XA Fracture of one rib, unspecified side, initial encounter for closed fracture: Secondary | ICD-10-CM

## 2011-09-10 DIAGNOSIS — R262 Difficulty in walking, not elsewhere classified: Secondary | ICD-10-CM | POA: Diagnosis not present

## 2011-09-10 DIAGNOSIS — M109 Gout, unspecified: Secondary | ICD-10-CM

## 2011-09-10 DIAGNOSIS — G319 Degenerative disease of nervous system, unspecified: Secondary | ICD-10-CM | POA: Diagnosis not present

## 2011-09-10 DIAGNOSIS — R609 Edema, unspecified: Secondary | ICD-10-CM

## 2011-09-10 DIAGNOSIS — R109 Unspecified abdominal pain: Secondary | ICD-10-CM | POA: Diagnosis not present

## 2011-09-10 DIAGNOSIS — F0390 Unspecified dementia without behavioral disturbance: Secondary | ICD-10-CM | POA: Diagnosis present

## 2011-09-10 DIAGNOSIS — I442 Atrioventricular block, complete: Secondary | ICD-10-CM

## 2011-09-10 DIAGNOSIS — J4489 Other specified chronic obstructive pulmonary disease: Secondary | ICD-10-CM | POA: Diagnosis not present

## 2011-09-10 DIAGNOSIS — L923 Foreign body granuloma of the skin and subcutaneous tissue: Secondary | ICD-10-CM

## 2011-09-10 DIAGNOSIS — R259 Unspecified abnormal involuntary movements: Secondary | ICD-10-CM

## 2011-09-10 DIAGNOSIS — Z7982 Long term (current) use of aspirin: Secondary | ICD-10-CM | POA: Diagnosis not present

## 2011-09-10 DIAGNOSIS — Z7189 Other specified counseling: Secondary | ICD-10-CM

## 2011-09-10 DIAGNOSIS — S8001XA Contusion of right knee, initial encounter: Secondary | ICD-10-CM

## 2011-09-10 DIAGNOSIS — M47817 Spondylosis without myelopathy or radiculopathy, lumbosacral region: Secondary | ICD-10-CM | POA: Diagnosis present

## 2011-09-10 DIAGNOSIS — K5900 Constipation, unspecified: Secondary | ICD-10-CM | POA: Diagnosis not present

## 2011-09-10 DIAGNOSIS — S199XXA Unspecified injury of neck, initial encounter: Secondary | ICD-10-CM | POA: Diagnosis not present

## 2011-09-10 DIAGNOSIS — S5780XA Crushing injury of unspecified forearm, initial encounter: Secondary | ICD-10-CM

## 2011-09-10 DIAGNOSIS — Z5189 Encounter for other specified aftercare: Secondary | ICD-10-CM | POA: Diagnosis not present

## 2011-09-10 DIAGNOSIS — M6281 Muscle weakness (generalized): Secondary | ICD-10-CM | POA: Diagnosis not present

## 2011-09-10 LAB — URINALYSIS, ROUTINE W REFLEX MICROSCOPIC
Glucose, UA: NEGATIVE mg/dL
Specific Gravity, Urine: 1.02 (ref 1.005–1.030)

## 2011-09-10 LAB — COMPREHENSIVE METABOLIC PANEL
ALT: 15 U/L (ref 0–53)
AST: 22 U/L (ref 0–37)
CO2: 28 mEq/L (ref 19–32)
Calcium: 10.1 mg/dL (ref 8.4–10.5)
GFR calc non Af Amer: 77 mL/min — ABNORMAL LOW (ref >90)
Potassium: 4.1 mEq/L (ref 3.5–5.1)
Sodium: 136 mEq/L (ref 135–145)
Total Protein: 7.1 g/dL (ref 6.0–8.3)

## 2011-09-10 LAB — CBC WITH DIFFERENTIAL/PLATELET
Basophils Absolute: 0 10*3/uL (ref 0.0–0.1)
Eosinophils Relative: 2 % (ref 0–5)
Lymphocytes Relative: 20 % (ref 12–46)
MCV: 91.5 fL (ref 78.0–100.0)
Neutrophils Relative %: 71 % (ref 43–77)
Platelets: 290 10*3/uL (ref 150–400)
RDW: 14 % (ref 11.5–15.5)
WBC: 12.1 10*3/uL — ABNORMAL HIGH (ref 4.0–10.5)

## 2011-09-10 LAB — URINE MICROSCOPIC-ADD ON

## 2011-09-10 LAB — ABO/RH: ABO/RH(D): AB POS

## 2011-09-10 LAB — TYPE AND SCREEN
ABO/RH(D): AB POS
Antibody Screen: NEGATIVE

## 2011-09-10 MED ORDER — DOCUSATE SODIUM 100 MG PO CAPS
100.0000 mg | ORAL_CAPSULE | Freq: Two times a day (BID) | ORAL | Status: DC
  Administered 2011-09-10 – 2011-09-13 (×6): 100 mg via ORAL
  Filled 2011-09-10 (×6): qty 1

## 2011-09-10 MED ORDER — SODIUM CHLORIDE 0.9 % IV SOLN
INTRAVENOUS | Status: DC
  Administered 2011-09-10 – 2011-09-12 (×5): via INTRAVENOUS

## 2011-09-10 MED ORDER — ONDANSETRON HCL 4 MG PO TABS: 4.0000 mg | ORAL_TABLET | Freq: Four times a day (QID) | ORAL | Status: DC | PRN

## 2011-09-10 MED ORDER — FENTANYL CITRATE 0.05 MG/ML IJ SOLN
50.0000 ug | Freq: Once | INTRAMUSCULAR | Status: AC
  Administered 2011-09-10: 50 ug via INTRAVENOUS
  Filled 2011-09-10: qty 2

## 2011-09-10 MED ORDER — ONDANSETRON HCL 4 MG/2ML IJ SOLN: 4.0000 mg | Freq: Three times a day (TID) | INTRAMUSCULAR | Status: AC | PRN

## 2011-09-10 MED ORDER — HYDROCHLOROTHIAZIDE 25 MG PO TABS
12.5000 mg | ORAL_TABLET | Freq: Every day | ORAL | Status: DC
Start: 2011-09-11 — End: 2011-09-13
  Administered 2011-09-11 – 2011-09-13 (×3): 12.5 mg via ORAL
  Filled 2011-09-10 (×3): qty 0.5

## 2011-09-10 MED ORDER — FINASTERIDE 5 MG PO TABS
5.0000 mg | ORAL_TABLET | Freq: Every day | ORAL | Status: DC
  Administered 2011-09-11 – 2011-09-13 (×3): 5 mg via ORAL
  Filled 2011-09-10 (×3): qty 1

## 2011-09-10 MED ORDER — SODIUM CHLORIDE 0.9 % IJ SOLN
3.0000 mL | Freq: Two times a day (BID) | INTRAMUSCULAR | Status: DC
  Administered 2011-09-11 – 2011-09-13 (×4): 3 mL via INTRAVENOUS

## 2011-09-10 MED ORDER — HYDROMORPHONE HCL PF 1 MG/ML IJ SOLN
0.5000 mg | Freq: Once | INTRAMUSCULAR | Status: AC
  Administered 2011-09-10: 0.5 mg via INTRAVENOUS
  Filled 2011-09-10: qty 1

## 2011-09-10 MED ORDER — SODIUM CHLORIDE 0.9 % IV SOLN: 250.0000 mL | INTRAVENOUS | Status: DC | PRN

## 2011-09-10 MED ORDER — ONDANSETRON HCL 4 MG/2ML IJ SOLN: 4.0000 mg | Freq: Four times a day (QID) | INTRAMUSCULAR | Status: DC | PRN

## 2011-09-10 MED ORDER — HYDROMORPHONE HCL PF 1 MG/ML IJ SOLN
0.5000 mg | INTRAMUSCULAR | Status: AC | PRN
  Administered 2011-09-11: 0.5 mg via INTRAVENOUS
  Filled 2011-09-10: qty 1

## 2011-09-10 MED ORDER — SODIUM CHLORIDE 0.9 % IV BOLUS (SEPSIS)
500.0000 mL | Freq: Once | INTRAVENOUS | Status: AC
  Administered 2011-09-10: 500 mL via INTRAVENOUS

## 2011-09-10 MED ORDER — SODIUM CHLORIDE 0.9 % IJ SOLN: 3.0000 mL | INTRAMUSCULAR | Status: DC | PRN

## 2011-09-10 MED ORDER — IOHEXOL 300 MG/ML  SOLN
100.0000 mL | Freq: Once | INTRAMUSCULAR | Status: AC | PRN
  Administered 2011-09-10: 100 mL via INTRAVENOUS

## 2011-09-10 MED ORDER — HYDROCODONE-ACETAMINOPHEN 5-325 MG PO TABS
1.0000 | ORAL_TABLET | ORAL | Status: DC | PRN
Start: 2011-09-10 — End: 2011-09-12
  Administered 2011-09-10 – 2011-09-11 (×4): 1 via ORAL
  Filled 2011-09-10 (×4): qty 1
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
## 2011-09-10 NOTE — ED Notes (Signed)
Patient was transferred to CDU report was given to [facility].  No questions.
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
## 2011-09-11 ENCOUNTER — Observation Stay (HOSPITAL_COMMUNITY): Payer: No Typology Code available for payment source

## 2011-09-11 DIAGNOSIS — S2239XA Fracture of one rib, unspecified side, initial encounter for closed fracture: Secondary | ICD-10-CM

## 2011-09-11 DIAGNOSIS — R0902 Hypoxemia: Secondary | ICD-10-CM

## 2011-09-11 DIAGNOSIS — I10 Essential (primary) hypertension: Secondary | ICD-10-CM

## 2011-09-11 DIAGNOSIS — S2249XA Multiple fractures of ribs, unspecified side, initial encounter for closed fracture: Secondary | ICD-10-CM

## 2011-09-11 DIAGNOSIS — I517 Cardiomegaly: Secondary | ICD-10-CM | POA: Diagnosis not present

## 2011-09-11 DIAGNOSIS — J984 Other disorders of lung: Secondary | ICD-10-CM | POA: Diagnosis not present

## 2011-09-11 DIAGNOSIS — R000 Tachycardia, unspecified: Secondary | ICD-10-CM

## 2011-09-11 DIAGNOSIS — S8000XA Contusion of unspecified knee, initial encounter: Secondary | ICD-10-CM

## 2011-09-11 LAB — CBC
MCH: 31.9 pg (ref 26.0–34.0)
Platelets: 255 10*3/uL (ref 150–400)
RBC: 4.11 MIL/uL — ABNORMAL LOW (ref 4.22–5.81)
RDW: 14.2 % (ref 11.5–15.5)

## 2011-09-11 LAB — BASIC METABOLIC PANEL
Calcium: 8.9 mg/dL (ref 8.4–10.5)
GFR calc non Af Amer: 77 mL/min — ABNORMAL LOW (ref >90)
Glucose, Bld: 91 mg/dL (ref 70–99)
Sodium: 139 mEq/L (ref 135–145)

## 2011-09-11 NOTE — Progress Notes (Signed)
Bladder scanned 393cc found. PA notified
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
## 2011-09-11 NOTE — Progress Notes (Signed)
Pt bladder scanned for . MD paged
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
## 2011-09-11 NOTE — Progress Notes (Addendum)
Clinical Social Work Department CLINICAL SOCIAL WORK PLACEMENT NOTE 09/11/2011  Patient:  Seth Hunter, Seth Hunter  Account Number:  [id number] Admit date:  09/10/2011  Clinical Social Worker:  Unk Lightning, LCSW  Date/time:  09/11/2011 04:30 PM  Clinical Social Work is seeking post-discharge placement for this patient at the following level of care:   SKILLED NURSING   (*CSW will update this form in Epic as items are completed)   09/11/2011  Patient/family provided with [facility] Department of Clinical Social Work's list of facilities offering this level of care within the geographic area requested by the patient (or if unable, by the patient's family).  09/11/2011  Patient/family informed of their freedom to choose among providers that offer the needed level of care, that participate in Medicare, Medicaid or managed care program needed by the patient, have an available bed and are willing to accept the patient.  09/11/2011  Patient/family informed of [facility]' ownership interest in [facility], as well as of the fact that they are under no obligation to receive care at this facility.  PASARR submitted to EDS on 09/11/2011 PASARR number received from EDS on 09/11/2011  FL2 transmitted to all facilities in geographic area requested by pt/family on  09/11/2011 FL2 transmitted to all facilities within larger geographic area on   Patient informed that his/her managed care company has contracts with or will negotiate with  certain facilities, including the following:     Patient/family informed of bed offers received:  09/12/11 Patient chooses bed at [facility] Physician recommends and patient chooses bed at    Patient to be transferred to [facility]  on  09/13/11 Patient to be transferred to facility by [facility]  The following physician request were entered in Epic:   Additional Comments:
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
## 2011-09-11 NOTE — Progress Notes (Signed)
Only doing 500cc on IS. Encouraged pulmonary toilet. Patient examined and I agree with the assessment and plan  Violeta Gelinas, MD, MPH, FACS Pager: [phone]  09/11/2011 2:33 PM
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
## 2011-09-11 NOTE — Progress Notes (Signed)
Clinical Social Work Department BRIEF PSYCHOSOCIAL ASSESSMENT 09/11/2011  Patient:  Seth Hunter, Seth Hunter     Account Number:  [id number]     Admit date:  09/10/2011  Clinical Social Worker:  Dennison Bulla  Date/Time:  09/11/2011 04:15 PM  Referred by:  Physician  Date Referred:  09/11/2011 Referred for  SNF Placement   Other Referral:   Interview type:  Patient Other interview type:    PSYCHOSOCIAL DATA Living Status:  ALONE Admitted from facility:   Level of care:   Primary support name:  Sandy Primary support relationship to patient:  FAMILY Degree of support available:   Strong    CURRENT CONCERNS Current Concerns  Post-Acute Placement   Other Concerns:    SOCIAL WORK ASSESSMENT / PLAN CSW received referral to assist with SNF placement. CSW spoke with CM who clarified that per Dr Jacky Kindle patient was inpatient status. CSW met with patient at bedside. No visitors were present.    CSW introduced myself and explained role. Patient reports that he is agreeable to SNF placement due to feeling weak. Patient has been to [facility] in the past to visit but has never been to SNF as a patient. CSW explained process and provided patient with SNF list. CSW received permission to contact niece. CSW spoke with family who is also agreeable to process. CSW explained Medicare benefits.    CSW completed FL2 and faxed out. CSW will continue to follow with bed offers.   Assessment/plan status:  Psychosocial Support/Ongoing Assessment of Needs Other assessment/ plan:   Information/referral to community resources:   SNF list    PATIENT'S/FAMILY'S RESPONSE TO PLAN OF CARE: Patient was alert and engaged in conversation. Patient agreeable to niece being involved in dc plans.

## 2011-09-11 NOTE — Progress Notes (Signed)
Pt voided in urinal

## 2011-09-12 DIAGNOSIS — K5903 Drug induced constipation: Secondary | ICD-10-CM | POA: Diagnosis present

## 2011-09-12 DIAGNOSIS — N401 Enlarged prostate with lower urinary tract symptoms: Secondary | ICD-10-CM

## 2011-09-12 DIAGNOSIS — K5909 Other constipation: Secondary | ICD-10-CM

## 2011-09-12 MED ORDER — DOCUSATE SODIUM 100 MG PO CAPS: 100.0000 mg | ORAL_CAPSULE | Freq: Two times a day (BID) | ORAL | Status: DC

## 2011-09-12 MED ORDER — GUAIFENESIN ER 600 MG PO TB12
1200.0000 mg | ORAL_TABLET | Freq: Two times a day (BID) | ORAL | Status: DC
  Administered 2011-09-12 – 2011-09-13 (×3): 1200 mg via ORAL
  Filled 2011-09-12 (×4): qty 2

## 2011-09-12 MED ORDER — TRAMADOL HCL 50 MG PO TABS
50.0000 mg | ORAL_TABLET | Freq: Four times a day (QID) | ORAL | Status: DC | PRN
  Administered 2011-09-12: 50 mg via ORAL
  Filled 2011-09-12: qty 1

## 2011-09-12 MED ORDER — SENNA 8.6 MG PO TABS
1.0000 | ORAL_TABLET | Freq: Every day | ORAL | Status: DC | PRN
  Filled 2011-09-12: qty 1

## 2011-09-12 MED ORDER — MORPHINE SULFATE 2 MG/ML IJ SOLN
2.0000 mg | Freq: Four times a day (QID) | INTRAMUSCULAR | Status: DC | PRN
Start: 2011-09-12 — End: 2011-09-13

## 2011-09-12 NOTE — Progress Notes (Signed)
Clinical Social Work  CSW met with patient and niece at bedside to give bed offers. [facility] was first choice but no beds available. Patient prefers [place]. CSW called [facility] who is agreeable to admission when patient is medically dc. CSW will continue to follow.  [place], [place] [phone]
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
## 2011-09-13 ENCOUNTER — Telehealth: Payer: Self-pay | Admitting: Family Medicine

## 2011-09-13 DIAGNOSIS — Z950 Presence of cardiac pacemaker: Secondary | ICD-10-CM | POA: Diagnosis not present

## 2011-09-13 DIAGNOSIS — IMO0001 Reserved for inherently not codable concepts without codable children: Secondary | ICD-10-CM | POA: Diagnosis not present

## 2011-09-13 DIAGNOSIS — S5780XA Crushing injury of unspecified forearm, initial encounter: Secondary | ICD-10-CM | POA: Diagnosis not present

## 2011-09-13 DIAGNOSIS — M6281 Muscle weakness (generalized): Secondary | ICD-10-CM | POA: Diagnosis not present

## 2011-09-13 DIAGNOSIS — K5900 Constipation, unspecified: Secondary | ICD-10-CM | POA: Diagnosis not present

## 2011-09-13 DIAGNOSIS — R262 Difficulty in walking, not elsewhere classified: Secondary | ICD-10-CM | POA: Diagnosis not present

## 2011-09-13 DIAGNOSIS — J449 Chronic obstructive pulmonary disease, unspecified: Secondary | ICD-10-CM | POA: Diagnosis not present

## 2011-09-13 DIAGNOSIS — N400 Enlarged prostate without lower urinary tract symptoms: Secondary | ICD-10-CM | POA: Diagnosis not present

## 2011-09-13 DIAGNOSIS — I10 Essential (primary) hypertension: Secondary | ICD-10-CM | POA: Diagnosis not present

## 2011-09-13 DIAGNOSIS — S2239XA Fracture of one rib, unspecified side, initial encounter for closed fracture: Secondary | ICD-10-CM | POA: Diagnosis not present

## 2011-09-13 DIAGNOSIS — R279 Unspecified lack of coordination: Secondary | ICD-10-CM | POA: Diagnosis not present

## 2011-09-13 DIAGNOSIS — S2249XA Multiple fractures of ribs, unspecified side, initial encounter for closed fracture: Secondary | ICD-10-CM | POA: Diagnosis not present

## 2011-09-13 DIAGNOSIS — F0390 Unspecified dementia without behavioral disturbance: Secondary | ICD-10-CM | POA: Diagnosis not present

## 2011-09-13 DIAGNOSIS — Z5189 Encounter for other specified aftercare: Secondary | ICD-10-CM | POA: Diagnosis not present

## 2011-09-13 MED ORDER — SENNA 8.6 MG PO TABS: 1.0000 | ORAL_TABLET | Freq: Every day | ORAL | Status: DC | PRN

## 2011-09-13 MED ORDER — DSS 100 MG PO CAPS: 100.0000 mg | ORAL_CAPSULE | Freq: Two times a day (BID) | ORAL | Status: AC

## 2011-09-13 MED ORDER — TRAMADOL HCL 50 MG PO TABS: 50.0000 mg | ORAL_TABLET | Freq: Four times a day (QID) | ORAL | Status: AC | PRN
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
## 2011-09-13 NOTE — Progress Notes (Signed)
Pt discharged to [facility] nursing facility via ambulance. IV's D/C'd

## 2011-09-13 NOTE — Telephone Encounter (Signed)
Spoke to patient's daughter Andrey Campanile) and was advised that patient just got to the SNF. Andrey Campanile stated that she will talk with them at the SNF and find out if the MD there will see the patient or if she should bring the patient here for a follow-up. Andrey Campanile stated that she will check on this and will call back and schedule an appointment with Dr. Para March if he is not going to be seen by the MDs at [facility].
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
## 2011-09-13 NOTE — Telephone Encounter (Signed)
Left message on answering machine to call back.

## 2011-09-13 NOTE — Progress Notes (Signed)
This patient has been seen and I agree with the findings and treatment plan.  Jamerion Cabello O. Monice Lundy, III, MD, FACS [phone] (pager) [phone] (direct pager) Trauma Surgeon  
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
## 2011-09-13 NOTE — Telephone Encounter (Signed)
Call and make sure patient has f/u either here or with the MDs at SNF.  Thanks.

## 2011-09-14 DIAGNOSIS — Z950 Presence of cardiac pacemaker: Secondary | ICD-10-CM | POA: Diagnosis not present

## 2011-09-14 DIAGNOSIS — S2239XA Fracture of one rib, unspecified side, initial encounter for closed fracture: Secondary | ICD-10-CM | POA: Diagnosis not present

## 2011-09-14 DIAGNOSIS — N400 Enlarged prostate without lower urinary tract symptoms: Secondary | ICD-10-CM | POA: Diagnosis not present

## 2011-09-14 DIAGNOSIS — K5900 Constipation, unspecified: Secondary | ICD-10-CM | POA: Diagnosis not present

## 2011-09-14 DIAGNOSIS — J449 Chronic obstructive pulmonary disease, unspecified: Secondary | ICD-10-CM | POA: Diagnosis not present

## 2011-09-14 DIAGNOSIS — I10 Essential (primary) hypertension: Secondary | ICD-10-CM | POA: Diagnosis not present

## 2011-09-14 NOTE — ED Provider Notes (Signed)
Medical screening examination/treatment/procedure(s) were conducted as a shared visit with non-physician practitioner(s) and myself.  I personally evaluated the patient during the encounter  Hurman Horn, MD 09/14/11 1132

## 2011-09-29 DIAGNOSIS — N400 Enlarged prostate without lower urinary tract symptoms: Secondary | ICD-10-CM | POA: Diagnosis not present

## 2011-09-29 DIAGNOSIS — S2239XA Fracture of one rib, unspecified side, initial encounter for closed fracture: Secondary | ICD-10-CM | POA: Diagnosis not present

## 2011-09-29 DIAGNOSIS — K5900 Constipation, unspecified: Secondary | ICD-10-CM | POA: Diagnosis not present

## 2011-09-29 DIAGNOSIS — I10 Essential (primary) hypertension: Secondary | ICD-10-CM | POA: Diagnosis not present

## 2011-09-29 DIAGNOSIS — J449 Chronic obstructive pulmonary disease, unspecified: Secondary | ICD-10-CM | POA: Diagnosis not present

## 2011-09-29 DIAGNOSIS — Z950 Presence of cardiac pacemaker: Secondary | ICD-10-CM | POA: Diagnosis not present

## 2011-10-02 ENCOUNTER — Ambulatory Visit (INDEPENDENT_AMBULATORY_CARE_PROVIDER_SITE_OTHER): Payer: Medicare Other | Admitting: Family Medicine

## 2011-10-02 ENCOUNTER — Encounter: Payer: Self-pay | Admitting: Family Medicine

## 2011-10-02 VITALS — BP 102/58 | Temp 98.5°F | Wt 179.0 lb

## 2011-10-02 DIAGNOSIS — S2232XA Fracture of one rib, left side, initial encounter for closed fracture: Secondary | ICD-10-CM

## 2011-10-02 DIAGNOSIS — S2239XA Fracture of one rib, unspecified side, initial encounter for closed fracture: Secondary | ICD-10-CM | POA: Diagnosis not present

## 2011-10-02 MED ORDER — SENNA 8.6 MG PO TABS: 1.0000 | ORAL_TABLET | Freq: Every day | ORAL | Status: DC | PRN

## 2011-10-02 MED ORDER — HYDROCHLOROTHIAZIDE 12.5 MG PO TABS: 12.5000 mg | ORAL_TABLET | Freq: Every day | ORAL | Status: DC | PRN

## 2011-10-02 MED ORDER — FINASTERIDE 5 MG PO TABS: 5.0000 mg | ORAL_TABLET | Freq: Every day | ORAL | Status: DC

## 2011-10-02 MED ORDER — TRAMADOL HCL 50 MG PO TABS: 50.0000 mg | ORAL_TABLET | Freq: Four times a day (QID) | ORAL | Status: DC | PRN

## 2011-10-02 NOTE — Patient Instructions (Addendum)
Keep using the breathing exercises and let me know if you have concerns.
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
## 2011-10-03 NOTE — Assessment & Plan Note (Signed)
Has help at home, is considering a life alert or similar.  Has PT/OT coming out.  meds refilled, on bowel regimen.  Okay for outpatient f/u.  Talked with pt about IS use to prevent PNA.  Lungs clear.  He agrees with plan.

## 2011-11-27 ENCOUNTER — Ambulatory Visit (INDEPENDENT_AMBULATORY_CARE_PROVIDER_SITE_OTHER): Payer: Medicare Other | Admitting: *Deleted

## 2011-11-27 DIAGNOSIS — I442 Atrioventricular block, complete: Secondary | ICD-10-CM | POA: Diagnosis not present

## 2011-11-27 LAB — PACEMAKER DEVICE OBSERVATION
AL IMPEDENCE PM: 479 Ohm
ATRIAL PACING PM: 14
BAMS-0001: 160 {beats}/min
RV LEAD THRESHOLD: 1 V

## 2011-11-27 NOTE — Progress Notes (Signed)
PPM check 

## 2011-12-21 ENCOUNTER — Encounter: Payer: Self-pay | Admitting: Family Medicine

## 2011-12-21 ENCOUNTER — Ambulatory Visit (INDEPENDENT_AMBULATORY_CARE_PROVIDER_SITE_OTHER): Payer: Medicare Other | Admitting: Family Medicine

## 2011-12-21 VITALS — BP 142/68 | HR 98 | Temp 99.0°F | Wt 177.0 lb

## 2011-12-21 DIAGNOSIS — L8990 Pressure ulcer of unspecified site, unspecified stage: Secondary | ICD-10-CM | POA: Diagnosis not present

## 2011-12-21 DIAGNOSIS — K4090 Unilateral inguinal hernia, without obstruction or gangrene, not specified as recurrent: Secondary | ICD-10-CM

## 2011-12-21 DIAGNOSIS — L8991 Pressure ulcer of unspecified site, stage 1: Secondary | ICD-10-CM | POA: Diagnosis not present

## 2011-12-21 NOTE — Patient Instructions (Signed)
Use hand cream on the irritated skin and alternate with a regular and a doughnut cushion.  See Shirlee Limerick about your referral before you leave today. Take care.

## 2011-12-21 NOTE — Progress Notes (Signed)
Lump in groin probably noted a few months ago.  Is getting bigger.  R sided.  Is slightly tender.  Also with tenderness on buttocks, in midline. Sits a lot during the day.   Meds, vitals, and allergies reviewed.   ROS: See HPI.  Otherwise, noncontributory.  nad Tremor at baseline Hard of hearing abd soft, large RIH noted, tender but reducable Gluteal crease and area just lateral to crease with erythema but no breakdown.

## 2011-12-22 DIAGNOSIS — K4090 Unilateral inguinal hernia, without obstruction or gangrene, not specified as recurrent: Secondary | ICD-10-CM | POA: Insufficient documentation

## 2011-12-22 DIAGNOSIS — L8991 Pressure ulcer of unspecified site, stage 1: Secondary | ICD-10-CM | POA: Insufficient documentation

## 2011-12-22 NOTE — Assessment & Plan Note (Signed)
I don't know if this can be repaired.  He is symptomatic, so will refer to [facility].  Would need cards input before intervention, if intervention was even possible.  Is reduceable. D/w pt and family.

## 2011-12-22 NOTE — Assessment & Plan Note (Signed)
OTC cream and off load with doughnut pillow.  D/w family, pt.

## 2011-12-25 ENCOUNTER — Encounter: Payer: Self-pay | Admitting: Internal Medicine

## 2011-12-27 ENCOUNTER — Encounter (INDEPENDENT_AMBULATORY_CARE_PROVIDER_SITE_OTHER): Payer: Self-pay | Admitting: General Surgery

## 2011-12-28 ENCOUNTER — Ambulatory Visit (INDEPENDENT_AMBULATORY_CARE_PROVIDER_SITE_OTHER): Payer: Medicare Other | Admitting: General Surgery

## 2012-01-05 ENCOUNTER — Encounter (INDEPENDENT_AMBULATORY_CARE_PROVIDER_SITE_OTHER): Payer: Self-pay | Admitting: General Surgery

## 2012-01-05 ENCOUNTER — Ambulatory Visit (INDEPENDENT_AMBULATORY_CARE_PROVIDER_SITE_OTHER): Payer: Medicare Other | Admitting: General Surgery

## 2012-01-05 VITALS — BP 138/84 | HR 68 | Temp 98.8°F | Resp 20 | Ht 74.0 in | Wt 180.0 lb

## 2012-01-05 DIAGNOSIS — K4090 Unilateral inguinal hernia, without obstruction or gangrene, not specified as recurrent: Secondary | ICD-10-CM | POA: Diagnosis not present
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
## 2012-01-17 HISTORY — PX: FRACTURE SURGERY: SHX138

## 2012-03-02 ENCOUNTER — Other Ambulatory Visit: Payer: Self-pay

## 2012-07-05 ENCOUNTER — Encounter: Payer: Self-pay | Admitting: Family Medicine

## 2012-07-05 ENCOUNTER — Ambulatory Visit (INDEPENDENT_AMBULATORY_CARE_PROVIDER_SITE_OTHER): Payer: Medicare Other | Admitting: Family Medicine

## 2012-07-05 VITALS — BP 124/68 | HR 92 | Temp 98.3°F | Wt 173.5 lb

## 2012-07-05 DIAGNOSIS — L989 Disorder of the skin and subcutaneous tissue, unspecified: Secondary | ICD-10-CM

## 2012-07-05 DIAGNOSIS — K4090 Unilateral inguinal hernia, without obstruction or gangrene, not specified as recurrent: Secondary | ICD-10-CM | POA: Diagnosis not present

## 2012-07-05 DIAGNOSIS — R238 Other skin changes: Secondary | ICD-10-CM

## 2012-07-05 NOTE — Patient Instructions (Addendum)
I would use eucerin (or similar) cream on your buttocks daily and after you shower.  Shower more frequently.   Keep using the fiber and drink more water to keep your bowels moving.  Take care.

## 2012-07-07 ENCOUNTER — Encounter: Payer: Self-pay | Admitting: Family Medicine

## 2012-07-07 DIAGNOSIS — R238 Other skin changes: Secondary | ICD-10-CM | POA: Insufficient documentation

## 2012-07-07 NOTE — Assessment & Plan Note (Signed)
D/w pt about shower at least QOD, use topical OTC cream, barrier cream if needed.  F/u prn. No ulceration.

## 2012-07-07 NOTE — Progress Notes (Signed)
BM this AM.  Prev with fewer BMs.  No abd pain.  H/o eval for hernia prev.  He saw surgery clinic.  Surgery deferred due to age, etc.   Irritation on buttocks.  No falls.  Using walking.  Often sitting.  Showers once a week, has done so for decades.   Meds, vitals, and allergies reviewed.   ROS: See HPI.  Otherwise, noncontributory.  nad Tremor at baseline Hard of hearing Mmm rrr ctab abd soft, soft RIH noted, normal BS B gluteal crease with irritation but no skin breakdown

## 2012-07-07 NOTE — Assessment & Plan Note (Signed)
With BM today, not obstructed.  Would not try to have him get hernia repaired unless incarcerated.  Discussed.

## 2012-08-13 ENCOUNTER — Encounter: Payer: Self-pay | Admitting: *Deleted

## 2012-08-21 ENCOUNTER — Other Ambulatory Visit: Payer: Self-pay

## 2012-09-10 ENCOUNTER — Other Ambulatory Visit: Payer: Self-pay | Admitting: Family Medicine

## 2012-10-11 ENCOUNTER — Telehealth: Payer: Self-pay | Admitting: Internal Medicine

## 2012-10-11 NOTE — Telephone Encounter (Signed)
10-11-12 LMM B@ 307PM FOR PT TO SE UP PAST DUE PACE CK WITH [facility], LAST SAW HIM 02-2011, DEVICE 11/2011/MT

## 2012-10-19 DIAGNOSIS — S72143A Displaced intertrochanteric fracture of unspecified femur, initial encounter for closed fracture: Secondary | ICD-10-CM | POA: Diagnosis not present

## 2012-10-20 ENCOUNTER — Emergency Department (HOSPITAL_COMMUNITY): Payer: Medicare Other

## 2012-10-20 ENCOUNTER — Encounter (HOSPITAL_COMMUNITY): Payer: Self-pay | Admitting: Emergency Medicine

## 2012-10-20 ENCOUNTER — Inpatient Hospital Stay (HOSPITAL_COMMUNITY)
Admission: EM | Admit: 2012-10-20 | Discharge: 2012-10-23 | DRG: 482 | Disposition: A | Discharge status: Skilled Nursing Facility | Payer: Medicare Other | Attending: Internal Medicine | Admitting: Internal Medicine

## 2012-10-20 DIAGNOSIS — Z4789 Encounter for other orthopedic aftercare: Secondary | ICD-10-CM | POA: Diagnosis not present

## 2012-10-20 DIAGNOSIS — S298XXA Other specified injuries of thorax, initial encounter: Secondary | ICD-10-CM | POA: Diagnosis not present

## 2012-10-20 DIAGNOSIS — S72143A Displaced intertrochanteric fracture of unspecified femur, initial encounter for closed fracture: Principal | ICD-10-CM | POA: Diagnosis present

## 2012-10-20 DIAGNOSIS — Z950 Presence of cardiac pacemaker: Secondary | ICD-10-CM

## 2012-10-20 DIAGNOSIS — IMO0002 Reserved for concepts with insufficient information to code with codable children: Secondary | ICD-10-CM | POA: Diagnosis not present

## 2012-10-20 DIAGNOSIS — F0390 Unspecified dementia without behavioral disturbance: Secondary | ICD-10-CM | POA: Diagnosis present

## 2012-10-20 DIAGNOSIS — S72002A Fracture of unspecified part of neck of left femur, initial encounter for closed fracture: Secondary | ICD-10-CM

## 2012-10-20 DIAGNOSIS — J449 Chronic obstructive pulmonary disease, unspecified: Secondary | ICD-10-CM | POA: Diagnosis present

## 2012-10-20 DIAGNOSIS — Z7901 Long term (current) use of anticoagulants: Secondary | ICD-10-CM | POA: Diagnosis not present

## 2012-10-20 DIAGNOSIS — I10 Essential (primary) hypertension: Secondary | ICD-10-CM | POA: Diagnosis present

## 2012-10-20 DIAGNOSIS — R1312 Dysphagia, oropharyngeal phase: Secondary | ICD-10-CM | POA: Diagnosis not present

## 2012-10-20 DIAGNOSIS — Z79899 Other long term (current) drug therapy: Secondary | ICD-10-CM

## 2012-10-20 DIAGNOSIS — Y92009 Unspecified place in unspecified non-institutional (private) residence as the place of occurrence of the external cause: Secondary | ICD-10-CM

## 2012-10-20 DIAGNOSIS — M79609 Pain in unspecified limb: Secondary | ICD-10-CM | POA: Diagnosis not present

## 2012-10-20 DIAGNOSIS — R488 Other symbolic dysfunctions: Secondary | ICD-10-CM | POA: Diagnosis not present

## 2012-10-20 DIAGNOSIS — W010XXA Fall on same level from slipping, tripping and stumbling without subsequent striking against object, initial encounter: Secondary | ICD-10-CM | POA: Diagnosis present

## 2012-10-20 DIAGNOSIS — S72142A Displaced intertrochanteric fracture of left femur, initial encounter for closed fracture: Secondary | ICD-10-CM

## 2012-10-20 DIAGNOSIS — R262 Difficulty in walking, not elsewhere classified: Secondary | ICD-10-CM | POA: Diagnosis not present

## 2012-10-20 DIAGNOSIS — Z66 Do not resuscitate: Secondary | ICD-10-CM | POA: Diagnosis present

## 2012-10-20 DIAGNOSIS — S72009A Fracture of unspecified part of neck of unspecified femur, initial encounter for closed fracture: Secondary | ICD-10-CM | POA: Diagnosis not present

## 2012-10-20 DIAGNOSIS — Z880 Allergy status to penicillin: Secondary | ICD-10-CM | POA: Diagnosis not present

## 2012-10-20 DIAGNOSIS — W19XXXA Unspecified fall, initial encounter: Secondary | ICD-10-CM | POA: Diagnosis present

## 2012-10-20 DIAGNOSIS — N400 Enlarged prostate without lower urinary tract symptoms: Secondary | ICD-10-CM | POA: Diagnosis not present

## 2012-10-20 DIAGNOSIS — S72009D Fracture of unspecified part of neck of unspecified femur, subsequent encounter for closed fracture with routine healing: Secondary | ICD-10-CM | POA: Diagnosis not present

## 2012-10-20 DIAGNOSIS — S72002S Fracture of unspecified part of neck of left femur, sequela: Secondary | ICD-10-CM

## 2012-10-20 DIAGNOSIS — Z5189 Encounter for other specified aftercare: Secondary | ICD-10-CM | POA: Diagnosis not present

## 2012-10-20 DIAGNOSIS — J4489 Other specified chronic obstructive pulmonary disease: Secondary | ICD-10-CM | POA: Diagnosis present

## 2012-10-20 DIAGNOSIS — N401 Enlarged prostate with lower urinary tract symptoms: Secondary | ICD-10-CM | POA: Diagnosis present

## 2012-10-20 DIAGNOSIS — M6281 Muscle weakness (generalized): Secondary | ICD-10-CM | POA: Diagnosis not present

## 2012-10-20 DIAGNOSIS — D72829 Elevated white blood cell count, unspecified: Secondary | ICD-10-CM | POA: Diagnosis not present

## 2012-10-20 DIAGNOSIS — M25559 Pain in unspecified hip: Secondary | ICD-10-CM | POA: Diagnosis not present

## 2012-10-20 DIAGNOSIS — R259 Unspecified abnormal involuntary movements: Secondary | ICD-10-CM | POA: Diagnosis present

## 2012-10-20 DIAGNOSIS — Z9181 History of falling: Secondary | ICD-10-CM | POA: Diagnosis not present

## 2012-10-20 DIAGNOSIS — T148XXA Other injury of unspecified body region, initial encounter: Secondary | ICD-10-CM | POA: Diagnosis not present

## 2012-10-20 HISTORY — DX: Unspecified hearing loss, unspecified ear: H91.90

## 2012-10-20 HISTORY — DX: Unilateral inguinal hernia, without obstruction or gangrene, not specified as recurrent: K40.90

## 2012-10-20 LAB — CBC WITH DIFFERENTIAL/PLATELET
Basophils Absolute: 0 10*3/uL (ref 0.0–0.1)
Basophils Relative: 0 % (ref 0–1)
Eosinophils Absolute: 0.2 10*3/uL (ref 0.0–0.7)
Eosinophils Relative: 1 % (ref 0–5)
HCT: 42.2 % (ref 39.0–52.0)
Hemoglobin: 14.4 g/dL (ref 13.0–17.0)
Lymphocytes Relative: 13 % (ref 12–46)
Lymphs Abs: 2.4 10*3/uL (ref 0.7–4.0)
MCH: 31.9 pg (ref 26.0–34.0)
MCHC: 34.1 g/dL (ref 30.0–36.0)
MCV: 93.6 fL (ref 78.0–100.0)
Monocytes Absolute: 1.5 10*3/uL — ABNORMAL HIGH (ref 0.1–1.0)
Monocytes Relative: 8 % (ref 3–12)
Neutro Abs: 14.6 10*3/uL — ABNORMAL HIGH (ref 1.7–7.7)
Neutrophils Relative %: 78 % — ABNORMAL HIGH (ref 43–77)
Platelets: 311 10*3/uL (ref 150–400)
RBC: 4.51 MIL/uL (ref 4.22–5.81)
RDW: 14.1 % (ref 11.5–15.5)
WBC: 18.7 10*3/uL — ABNORMAL HIGH (ref 4.0–10.5)

## 2012-10-20 LAB — BASIC METABOLIC PANEL
GFR calc Af Amer: 83 mL/min — ABNORMAL LOW (ref >90)
GFR calc non Af Amer: 72 mL/min — ABNORMAL LOW (ref >90)
Potassium: 4 mEq/L (ref 3.5–5.1)
Sodium: 137 mEq/L (ref 135–145)

## 2012-10-20 LAB — URINALYSIS, ROUTINE W REFLEX MICROSCOPIC
Hgb urine dipstick: NEGATIVE
Nitrite: NEGATIVE
Specific Gravity, Urine: 1.022 (ref 1.005–1.030)
Urobilinogen, UA: 1 mg/dL (ref 0.0–1.0)
pH: 8 (ref 5.0–8.0)

## 2012-10-20 LAB — PROTIME-INR
INR: 0.93 (ref 0.00–1.49)
Prothrombin Time: 12.3 seconds (ref 11.6–15.2)

## 2012-10-20 LAB — ABO/RH: ABO/RH(D): AB POS

## 2012-10-20 LAB — TYPE AND SCREEN: ABO/RH(D): AB POS

## 2012-10-20 MED ORDER — ONDANSETRON HCL 4 MG PO TABS: 4.0000 mg | ORAL_TABLET | Freq: Four times a day (QID) | ORAL | Status: DC | PRN

## 2012-10-20 MED ORDER — METHOCARBAMOL 500 MG PO TABS: 500.0000 mg | ORAL_TABLET | Freq: Four times a day (QID) | ORAL | Status: DC | PRN

## 2012-10-20 MED ORDER — FINASTERIDE 5 MG PO TABS
5.0000 mg | ORAL_TABLET | Freq: Every day | ORAL | Status: DC
  Administered 2012-10-21 – 2012-10-23 (×3): 5 mg via ORAL
  Filled 2012-10-20 (×3): qty 1

## 2012-10-20 MED ORDER — SODIUM CHLORIDE 0.45 % IV SOLN: INTRAVENOUS | Status: DC

## 2012-10-20 MED ORDER — HYDROCODONE-ACETAMINOPHEN 5-325 MG PO TABS: 1.0000 | ORAL_TABLET | ORAL | Status: DC | PRN

## 2012-10-20 MED ORDER — ASPIRIN EC 81 MG PO TBEC
81.0000 mg | DELAYED_RELEASE_TABLET | Freq: Every day | ORAL | Status: DC
  Administered 2012-10-22: 81 mg via ORAL
  Filled 2012-10-20 (×2): qty 1

## 2012-10-20 MED ORDER — SODIUM CHLORIDE 0.9 % IJ SOLN
3.0000 mL | Freq: Two times a day (BID) | INTRAMUSCULAR | Status: DC
  Administered 2012-10-21 – 2012-10-23 (×4): 3 mL via INTRAVENOUS

## 2012-10-20 MED ORDER — ONDANSETRON HCL 4 MG/2ML IJ SOLN
4.0000 mg | Freq: Once | INTRAMUSCULAR | Status: AC
  Administered 2012-10-20: 4 mg via INTRAVENOUS
  Filled 2012-10-20: qty 2

## 2012-10-20 MED ORDER — ACETAMINOPHEN 325 MG PO TABS: 650.0000 mg | ORAL_TABLET | Freq: Four times a day (QID) | ORAL | Status: DC | PRN

## 2012-10-20 MED ORDER — ACETAMINOPHEN 650 MG RE SUPP: 650.0000 mg | Freq: Four times a day (QID) | RECTAL | Status: DC | PRN

## 2012-10-20 MED ORDER — HYDROCHLOROTHIAZIDE 12.5 MG PO CAPS
12.5000 mg | ORAL_CAPSULE | Freq: Every day | ORAL | Status: DC
  Administered 2012-10-21 – 2012-10-23 (×3): 12.5 mg via ORAL
  Filled 2012-10-20 (×3): qty 1

## 2012-10-20 MED ORDER — FENTANYL CITRATE 0.05 MG/ML IJ SOLN: 25.0000 ug | INTRAMUSCULAR | Status: DC | PRN

## 2012-10-20 MED ORDER — SODIUM CHLORIDE 0.9 % IV SOLN
1000.0000 mL | INTRAVENOUS | Status: DC
  Administered 2012-10-20: 500 mL via INTRAVENOUS

## 2012-10-20 MED ORDER — ONDANSETRON HCL 4 MG/2ML IJ SOLN: 4.0000 mg | Freq: Three times a day (TID) | INTRAMUSCULAR | Status: DC | PRN

## 2012-10-20 MED ORDER — HYDROCODONE-ACETAMINOPHEN 5-325 MG PO TABS: 1.0000 | ORAL_TABLET | Freq: Four times a day (QID) | ORAL | Status: DC | PRN

## 2012-10-20 MED ORDER — DEXTROSE 5 % IV SOLN
500.0000 mg | Freq: Four times a day (QID) | INTRAVENOUS | Status: DC | PRN
  Filled 2012-10-20: qty 5

## 2012-10-20 MED ORDER — DOCUSATE SODIUM 100 MG PO CAPS
100.0000 mg | ORAL_CAPSULE | Freq: Two times a day (BID) | ORAL | Status: DC
  Administered 2012-10-21 – 2012-10-23 (×5): 100 mg via ORAL
  Filled 2012-10-20 (×7): qty 1

## 2012-10-20 MED ORDER — ONDANSETRON HCL 4 MG/2ML IJ SOLN: 4.0000 mg | Freq: Four times a day (QID) | INTRAMUSCULAR | Status: DC | PRN

## 2012-10-20 MED ORDER — MORPHINE SULFATE 2 MG/ML IJ SOLN: 0.5000 mg | INTRAMUSCULAR | Status: DC | PRN

## 2012-10-20 MED ORDER — FENTANYL CITRATE 0.05 MG/ML IJ SOLN: 12.5000 ug | INTRAMUSCULAR | Status: DC | PRN

## 2012-10-20 NOTE — ED Provider Notes (Signed)
CSN: [id number]     Arrival date & time 10/20/12  1734 History   First MD Initiated Contact with Patient 10/20/12 1742     Chief Complaint  Patient presents with  . Fall  . Hip Pain    HPI Pt was seen at 1810. Per pt and family, c/o sudden onset and resolution of one episode of slip/trip and fall that occurred today approx 1500. Pt states he was in the kitchen and "tripped," landing on his left hip. Pt c/o left hip pain since the fall. Was unable to stand or ambulate after the fall due to left hip pain. Denies hitting head, no LOC, no syncope, no AMS, no neck or back pain, no CP/palpitations, no SOB/cough, no abd pain, no N/V/D, no focal motor weakness, no tingling/numbness in extremities.    Past Medical History  Diagnosis Date  . Syncope     Syncope in the setting of complete heart block status post permanent pacemaker placement on September 27, 2007  . Hypertension   . Dementia     thought this appears to be mild based on reports of family as of 8/11  . Elevated TSH     Mildly elevated TSH  . Atrioventricular block, complete   . Pacemaker -MDT   . COPD (chronic obstructive pulmonary disease) 04/1995    X-ray  . BPH (benign prostatic hyperplasia)   . Tremor   . Pneumonia 04/1995    Bilateral upper lobes  . Anemia 04/1995    Post op, 2nd to hemorrhage, left leg  . UTI (urinary tract infection) 05/1995  . DJD (degenerative joint disease), lumbar 04/1995    Spine, severe.   X-ray  . DJD (degenerative joint disease) 04/1995    Both shoulders, severe  . HOH (hard of hearing)   . Inguinal hernia    Past Surgical History  Procedure Laterality Date  . Pacemaker insertion    . Cholecystectomy  1988  . Hernia repair  Years ago    Left inguinal  . Ventral hernia repair  04/1995    with intra abdominal adhesions, lysis incidental appendectomy  . Pulmonary embolism surgery  04/1995    Post-op  . 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 Hematoma, left leg   05/1995    Probably 2nd to anticoag  . 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 Fractured left wrist  1990    with old right posterior rib fracture  . Syncope comp heart block  9/10- 09/30/2007    HOSP Perm Pacer placed, HTN, dementia, mildly elevated TSH  . Pacer placement (dr. [name])  09/27/2007  . Ct angio chest  09/27/07    No PE, No acute prob   Family History  Problem Relation Age of Onset  . Heart disease Father     Hardening of the arteries, heart trouble   History  Substance Use Topics  . Smoking status: Never Smoker   . Smokeless tobacco: Not on file  . Alcohol Use: No    Review of Systems ROS: Statement: All systems negative except as marked or noted in the HPI; Constitutional: Negative for fever and chills. ; ; Eyes: Negative for eye pain, redness and discharge. ; ; ENMT: Negative for ear pain, hoarseness, nasal congestion, sinus pressure and sore throat. ; ; Cardiovascular: Negative for chest pain, palpitations, diaphoresis, dyspnea and peripheral edema. ; ; Respiratory: Negative for cough, wheezing and stridor. ; ; Gastrointestinal:  Negative for nausea, vomiting, diarrhea, abdominal pain, blood in stool, hematemesis, jaundice and rectal bleeding. . ; ; Genitourinary: Negative for dysuria, flank pain and hematuria. ; ; Musculoskeletal: Negative for back pain and neck pain. +left hip pain and trauma.; ; Skin: Negative for pruritus, rash, abrasions, blisters, bruising and skin lesion.; ; Neuro: Negative for headache, lightheadedness and neck stiffness. Negative for weakness, altered level of consciousness , altered mental status, extremity weakness, paresthesias, involuntary movement, seizure and syncope.       Allergies  Penicillins  Home Medications   Current Outpatient Rx  Name  Route  Sig  Dispense  Refill  . aspirin 81 MG tablet   Oral   Take 81 mg by mouth daily.         . Garlic 400 MG TABS   Oral   Take 400 mg by mouth  daily.         . hydrochlorothiazide (HYDRODIURIL) 12.5 MG tablet   Oral   Take 1 tablet (12.5 mg total) by mouth daily as needed. For swelling.   90 tablet   3   . finasteride (PROSCAR) 5 MG tablet   Oral   Take 1 tablet (5 mg total) by mouth daily.   90 tablet   3   . hydrochlorothiazide (MICROZIDE) 12.5 MG capsule      TAKE 1-2 CAPSULES BY MOUTH EVERY MORNING   180 capsule   1    BP 151/83  Pulse 89  Temp(Src) 98.1 F (36.7 C) (Oral)  Resp 20  SpO2 100% Physical Exam 1815: Physical examination:  Nursing notes reviewed; Vital signs and O2 SAT reviewed;  Constitutional: Well developed, Well nourished, Well hydrated, In no acute distress; Head:  Normocephalic, atraumatic; Eyes: EOMI, PERRL, No scleral icterus; ENMT: Mouth and pharynx normal, Mucous membranes moist; Neck: Supple, Full range of motion, No lymphadenopathy; Cardiovascular: Regular rate and rhythm, No gallop; Respiratory: Breath sounds clear & equal bilaterally, No wheezes.  Speaking full sentences with ease, Normal respiratory effort/excursion; Chest: Nontender, Movement normal; Abdomen: Soft, Nontender, Nondistended, Normal bowel sounds; Genitourinary: No CVA tenderness; Extremities: Pulses normal, pelvis stable. +left hip tenderness to palp, +LLE shortened and externally rotated. No open wounds, no erythema, no ecchymosis. NMS intact distal LLE. NT left knee/ankle/foot. No edema, No calf edema or asymmetry.; Neuro: AA&Ox3, +HOH per baseline, otherwise major CN grossly intact.  Speech clear. Decreased ROM left hip, otherwise no gross focal motor or sensory deficits in extremities.; Skin: Color normal, Warm, Dry.   ED Course  Procedures     MDM  MDM Reviewed: previous chart, nursing note and vitals Reviewed previous: labs and ECG Interpretation: labs, ECG and x-ray    Date: 10/20/2012  Rate: 95  Rhythm: electronic pacemaker  QRS Axis: normal  Intervals: normal  ST/T Wave abnormalities: nonspecific ST/T  changes  Conduction Disutrbances:left bundle branch block  Narrative Interpretation:   Old EKG Reviewed: unchanged; no significant changes from previous EKG dated 09/10/2011.  Results for orders placed during the hospital encounter of 10/20/12  BASIC METABOLIC PANEL      Result Value Range   Sodium 137  135 - 145 mEq/L   Potassium 4.0  3.5 - 5.1 mEq/L   Chloride 100  96 - 112 mEq/L   CO2 27  19 - 32 mEq/L   Glucose, Bld 116 (*) 70 - 99 mg/dL   BUN 21  6 - 23 mg/dL   Creatinine, Ser 3.08  0.50 - 1.35 mg/dL   Calcium 9.8  8.4 - 10.5 mg/dL   GFR calc non Af Amer 72 (*) >90 mL/min   GFR calc Af Amer 83 (*) >90 mL/min  CBC WITH DIFFERENTIAL      Result Value Range   WBC 18.7 (*) 4.0 - 10.5 K/uL   RBC 4.51  4.22 - 5.81 MIL/uL   Hemoglobin 14.4  13.0 - 17.0 g/dL   HCT 16.1  09.6 - 04.5 %   MCV 93.6  78.0 - 100.0 fL   MCH 31.9  26.0 - 34.0 pg   MCHC 34.1  30.0 - 36.0 g/dL   RDW 40.9  81.1 - 91.4 %   Platelets 311  150 - 400 K/uL   Neutrophils Relative % 78 (*) 43 - 77 %   Neutro Abs 14.6 (*) 1.7 - 7.7 K/uL   Lymphocytes Relative 13  12 - 46 %   Lymphs Abs 2.4  0.7 - 4.0 K/uL   Monocytes Relative 8  3 - 12 %   Monocytes Absolute 1.5 (*) 0.1 - 1.0 K/uL   Eosinophils Relative 1  0 - 5 %   Eosinophils Absolute 0.2  0.0 - 0.7 K/uL   Basophils Relative 0  0 - 1 %   Basophils Absolute 0.0  0.0 - 0.1 K/uL  PROTIME-INR      Result Value Range   Prothrombin Time 12.3  11.6 - 15.2 seconds   INR 0.93  0.00 - 1.49  TROPONIN I      Result Value Range   Troponin I <0.30  <0.30 ng/mL  URINALYSIS, ROUTINE W REFLEX MICROSCOPIC      Result Value Range   Color, Urine YELLOW  YELLOW   APPearance CLEAR  CLEAR   Specific Gravity, Urine 1.022  1.005 - 1.030   pH 8.0  5.0 - 8.0   Glucose, UA NEGATIVE  NEGATIVE mg/dL   Hgb urine dipstick NEGATIVE  NEGATIVE   Bilirubin Urine NEGATIVE  NEGATIVE   Ketones, ur NEGATIVE  NEGATIVE mg/dL   Protein, ur NEGATIVE  NEGATIVE mg/dL   Urobilinogen, UA 1.0   0.0 - 1.0 mg/dL   Nitrite NEGATIVE  NEGATIVE   Leukocytes, UA NEGATIVE  NEGATIVE  TYPE AND SCREEN      Result Value Range   ABO/RH(D) AB POS     Antibody Screen NEG     Sample Expiration 10/23/2012    ABO/RH      Result Value Range   ABO/RH(D) AB POS     Dg Chest 1 View 10/20/2012   CLINICAL DATA:  Fall. Left hip pain. Deformity.  EXAM: CHEST - 1 VIEW  COMPARISON:  09/11/2011.  FINDINGS: Lead left subclavian cardiac pacemaker. Chronic left rotator cuff tear with severe bilateral glenohumeral osteoarthritis.  Old right-sided rib fractures are present. The lungs appear clear aside from areas of pulmonary parenchymal scarring. Tortuous thoracic aorta. The cardiopericardial silhouette is within normal limits. Patient is mildly rotated to the right. Clavicles appear intact.  Compared to the prior exam, left basilar opacity has resolved.  IMPRESSION: No acute cardiopulmonary disease.   Electronically Signed   By: Andreas Newport M.D.   On: 10/20/2012 18:40   Dg Hip Complete Left 10/20/2012   CLINICAL DATA:  Fall. Left hip pain and deformity.  EXAM: LEFT HIP - COMPLETE 2+ VIEW  COMPARISON:  None.  FINDINGS: Comminuted intertrochanteric left femur fracture is present with apex superior and lateral angulation. Diffuse osteopenia. Grossly, the obturator rings appear intact. The right hip is externally rotated. 16 mm  of displacement of the lesser trochanter fragment from the left hip. Left femoral head remains located.  IMPRESSION: Comminuted intertrochanteric left femur fracture.   Electronically Signed   By: Andreas Newport M.D.   On: 10/20/2012 18:38    2100:  Dx and testing d/w pt and family.  Questions answered.  Verb understanding, agreeable to admit.  T/C to Ortho Dr. August Saucer, case discussed, including:  HPI, pertinent PM/SHx, VS/PE, dx testing, ED course and treatment:  Agreeable to consult, requests to admit to [facility], will likely eval pt in AM to go to the OR tomorrow. T/C to [facility] Dr. Adela Glimpse, case  discussed, including:  HPI, pertinent PM/SHx, VS/PE, dx testing, ED course and treatment:  Agreeable to admit, requests to write temporary orders, obtain tele bed to team 8.        Laray Anger, DO 10/21/12 1201

## 2012-10-20 NOTE — ED Notes (Signed)
Bed: ZO10 Expected date:  Expected time:  Means of arrival:  Comments: 79 y/o M, fall, hip pain
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
## 2012-10-20 NOTE — ED Notes (Signed)
Received verbal order from EDP Mcmanus to insert foley catheter. RN Francena Hanly made aware.She will put order into chart.

## 2012-10-20 NOTE — ED Notes (Addendum)
EMS: Pt fell about 3 pm today.  Was found on the floor about 430 pm.  C/o lt hip pain.  Pt was incontinent of urine during time down.  Shortening/rotation noted.  20 g in lt AC. 10 mcg fentanyl given en route.

## 2012-10-21 ENCOUNTER — Encounter (HOSPITAL_COMMUNITY)
Admission: EM | Disposition: A | Discharge status: Skilled Nursing Facility | Payer: Self-pay | Source: Home / Self Care | Attending: Internal Medicine

## 2012-10-21 ENCOUNTER — Encounter (HOSPITAL_COMMUNITY): Payer: Self-pay | Admitting: Certified Registered"

## 2012-10-21 ENCOUNTER — Encounter (HOSPITAL_COMMUNITY): Payer: Self-pay | Admitting: Anesthesiology

## 2012-10-21 ENCOUNTER — Inpatient Hospital Stay (HOSPITAL_COMMUNITY): Payer: Medicare Other

## 2012-10-21 ENCOUNTER — Inpatient Hospital Stay (HOSPITAL_COMMUNITY): Payer: Medicare Other | Admitting: Anesthesiology

## 2012-10-21 DIAGNOSIS — I10 Essential (primary) hypertension: Secondary | ICD-10-CM | POA: Diagnosis not present

## 2012-10-21 DIAGNOSIS — S72009A Fracture of unspecified part of neck of unspecified femur, initial encounter for closed fracture: Secondary | ICD-10-CM | POA: Diagnosis not present

## 2012-10-21 DIAGNOSIS — D72829 Elevated white blood cell count, unspecified: Secondary | ICD-10-CM | POA: Diagnosis present

## 2012-10-21 DIAGNOSIS — IMO0002 Reserved for concepts with insufficient information to code with codable children: Secondary | ICD-10-CM | POA: Diagnosis not present

## 2012-10-21 DIAGNOSIS — J449 Chronic obstructive pulmonary disease, unspecified: Secondary | ICD-10-CM | POA: Diagnosis not present

## 2012-10-21 DIAGNOSIS — Z4789 Encounter for other orthopedic aftercare: Secondary | ICD-10-CM | POA: Diagnosis not present

## 2012-10-21 DIAGNOSIS — S72143A Displaced intertrochanteric fracture of unspecified femur, initial encounter for closed fracture: Secondary | ICD-10-CM | POA: Diagnosis not present

## 2012-10-21 DIAGNOSIS — Z950 Presence of cardiac pacemaker: Secondary | ICD-10-CM | POA: Diagnosis not present

## 2012-10-21 DIAGNOSIS — F0390 Unspecified dementia without behavioral disturbance: Secondary | ICD-10-CM | POA: Diagnosis not present

## 2012-10-21 DIAGNOSIS — N400 Enlarged prostate without lower urinary tract symptoms: Secondary | ICD-10-CM | POA: Diagnosis not present

## 2012-10-21 HISTORY — PX: FEMUR IM NAIL: SHX1597

## 2012-10-21 LAB — CBC
HCT: 40.8 % (ref 39.0–52.0)
Hemoglobin: 14.1 g/dL (ref 13.0–17.0)
MCHC: 34.6 g/dL (ref 30.0–36.0)
MCV: 93.6 fL (ref 78.0–100.0)
RDW: 14.2 % (ref 11.5–15.5)
WBC: 12.2 10*3/uL — ABNORMAL HIGH (ref 4.0–10.5)

## 2012-10-21 LAB — COMPREHENSIVE METABOLIC PANEL
ALT: 14 U/L (ref 0–53)
Albumin: 3.1 g/dL — ABNORMAL LOW (ref 3.5–5.2)
Alkaline Phosphatase: 81 U/L (ref 39–117)
BUN: 19 mg/dL (ref 6–23)
Glucose, Bld: 115 mg/dL — ABNORMAL HIGH (ref 70–99)
Potassium: 4.6 mEq/L (ref 3.5–5.1)
Sodium: 136 mEq/L (ref 135–145)
Total Protein: 6.6 g/dL (ref 6.0–8.3)

## 2012-10-21 LAB — MAGNESIUM: Magnesium: 1.9 mg/dL (ref 1.5–2.5)

## 2012-10-21 SURGERY — INSERTION, INTRAMEDULLARY ROD, FEMUR
Anesthesia: General | Site: Hip | Laterality: Left | Wound class: Clean

## 2012-10-21 MED ORDER — FENTANYL CITRATE 0.05 MG/ML IJ SOLN
INTRAMUSCULAR | Status: DC | PRN
  Administered 2012-10-21 (×2): 25 ug via INTRAVENOUS
  Administered 2012-10-21: 100 ug via INTRAVENOUS
  Administered 2012-10-21 (×2): 50 ug via INTRAVENOUS

## 2012-10-21 MED ORDER — HYDROCODONE-ACETAMINOPHEN 5-325 MG PO TABS
1.0000 | ORAL_TABLET | Freq: Four times a day (QID) | ORAL | Status: DC | PRN
  Administered 2012-10-22 (×2): 1 via ORAL
  Filled 2012-10-21 (×2): qty 1

## 2012-10-21 MED ORDER — ACETAMINOPHEN 325 MG PO TABS: 650.0000 mg | ORAL_TABLET | Freq: Four times a day (QID) | ORAL | Status: DC | PRN

## 2012-10-21 MED ORDER — PHENYLEPHRINE HCL 10 MG/ML IJ SOLN
INTRAMUSCULAR | Status: DC | PRN
  Administered 2012-10-21: 80 ug via INTRAVENOUS

## 2012-10-21 MED ORDER — SUCCINYLCHOLINE CHLORIDE 20 MG/ML IJ SOLN
INTRAMUSCULAR | Status: DC | PRN
  Administered 2012-10-21: 100 mg via INTRAVENOUS

## 2012-10-21 MED ORDER — LACTATED RINGERS IV SOLN
INTRAVENOUS | Status: DC | PRN
  Administered 2012-10-21: 19:00:00 via INTRAVENOUS

## 2012-10-21 MED ORDER — PROPOFOL 10 MG/ML IV BOLUS
INTRAVENOUS | Status: DC | PRN
  Administered 2012-10-21: 80 mg via INTRAVENOUS

## 2012-10-21 MED ORDER — POTASSIUM CHLORIDE IN NACL 20-0.9 MEQ/L-% IV SOLN
INTRAVENOUS | Status: DC
  Administered 2012-10-22: via INTRAVENOUS
  Filled 2012-10-21 (×2): qty 1000

## 2012-10-21 MED ORDER — HYDROMORPHONE HCL PF 1 MG/ML IJ SOLN
0.2500 mg | INTRAMUSCULAR | Status: DC | PRN
  Administered 2012-10-22 (×2): 0.5 mg via INTRAVENOUS
  Filled 2012-10-21 (×2): qty 1

## 2012-10-21 MED ORDER — CEFAZOLIN SODIUM-DEXTROSE 2-3 GM-% IV SOLR: 2.0000 g | Freq: Once | INTRAVENOUS | Status: DC

## 2012-10-21 MED ORDER — WARFARIN SODIUM 3 MG PO TABS
3.0000 mg | ORAL_TABLET | ORAL | Status: AC
  Administered 2012-10-22: 3 mg via ORAL
  Filled 2012-10-21: qty 1

## 2012-10-21 MED ORDER — COUMADIN BOOK
1.0000 | Freq: Once | Status: AC
  Administered 2012-10-22: 13:00:00 1
  Filled 2012-10-21 (×2): qty 1

## 2012-10-21 MED ORDER — PHENOL 1.4 % MT LIQD
1.0000 | OROMUCOSAL | Status: DC | PRN
  Filled 2012-10-21: qty 177

## 2012-10-21 MED ORDER — ACETAMINOPHEN 650 MG RE SUPP: 650.0000 mg | Freq: Four times a day (QID) | RECTAL | Status: DC | PRN

## 2012-10-21 MED ORDER — ONDANSETRON HCL 4 MG/2ML IJ SOLN
INTRAMUSCULAR | Status: DC | PRN
  Administered 2012-10-21: 4 mg via INTRAMUSCULAR

## 2012-10-21 MED ORDER — METOCLOPRAMIDE HCL 10 MG PO TABS: 5.0000 mg | ORAL_TABLET | Freq: Three times a day (TID) | ORAL | Status: DC | PRN

## 2012-10-21 MED ORDER — METOCLOPRAMIDE HCL 5 MG/ML IJ SOLN: 5.0000 mg | Freq: Three times a day (TID) | INTRAMUSCULAR | Status: DC | PRN

## 2012-10-21 MED ORDER — ONDANSETRON HCL 4 MG/2ML IJ SOLN: 4.0000 mg | Freq: Four times a day (QID) | INTRAMUSCULAR | Status: DC | PRN

## 2012-10-21 MED ORDER — ONDANSETRON HCL 4 MG PO TABS: 4.0000 mg | ORAL_TABLET | Freq: Four times a day (QID) | ORAL | Status: DC | PRN

## 2012-10-21 MED ORDER — MORPHINE SULFATE 2 MG/ML IJ SOLN: 0.5000 mg | INTRAMUSCULAR | Status: DC | PRN

## 2012-10-21 MED ORDER — CEFAZOLIN SODIUM-DEXTROSE 2-3 GM-% IV SOLR
INTRAVENOUS | Status: DC | PRN
  Administered 2012-10-21: 2 g via INTRAVENOUS

## 2012-10-21 MED ORDER — MENTHOL 3 MG MT LOZG
1.0000 | LOZENGE | OROMUCOSAL | Status: DC | PRN
  Filled 2012-10-21: qty 9

## 2012-10-21 MED ORDER — WARFARIN - PHARMACIST DOSING INPATIENT: Freq: Every day | Status: DC

## 2012-10-21 MED ORDER — LACTATED RINGERS IV SOLN: INTRAVENOUS | Status: DC

## 2012-10-21 MED ORDER — WARFARIN VIDEO
Freq: Once | Status: AC
  Administered 2012-10-22: 13:00:00

## 2012-10-21 MED ORDER — CEFAZOLIN SODIUM 1-5 GM-% IV SOLN
1.0000 g | Freq: Three times a day (TID) | INTRAVENOUS | Status: AC
  Administered 2012-10-22 (×2): 1 g via INTRAVENOUS
  Filled 2012-10-21 (×2): qty 50

## 2012-10-21 SURGICAL SUPPLY — 36 items
BAG ZIPLOCK 12X15 (MISCELLANEOUS) ×2 IMPLANT
BANDAGE ELASTIC 6 VELCRO ST LF (GAUZE/BANDAGES/DRESSINGS) ×2 IMPLANT
BANDAGE GAUZE ELAST BULKY 4 IN (GAUZE/BANDAGES/DRESSINGS) ×2 IMPLANT
BLADE SURG 15 STRL LF DISP TIS (BLADE) ×1 IMPLANT
BLADE SURG 15 STRL SS (BLADE) ×1
CLOTH BEACON ORANGE TIMEOUT ST (SAFETY) ×2 IMPLANT
DRAPE STERI IOBAN 125X83 (DRAPES) ×2 IMPLANT
DRSG MEPILEX BORDER 4X4 (GAUZE/BANDAGES/DRESSINGS) ×2 IMPLANT
DRSG PAD ABDOMINAL 8X10 ST (GAUZE/BANDAGES/DRESSINGS) ×2 IMPLANT
DRSG TEGADERM 4X4.75 (GAUZE/BANDAGES/DRESSINGS) ×2 IMPLANT
DRSG XEROFORM 1X8 (GAUZE/BANDAGES/DRESSINGS) ×2 IMPLANT
DURAPREP 26ML APPLICATOR (WOUND CARE) ×2 IMPLANT
ELECT REM PT RETURN 9FT ADLT (ELECTROSURGICAL) ×2
ELECTRODE REM PT RTRN 9FT ADLT (ELECTROSURGICAL) ×1 IMPLANT
GLOVE SURG ORTHO 8.0 STRL STRW (GLOVE) ×2 IMPLANT
GOWN PREVENTION PLUS LG XLONG (DISPOSABLE) ×2 IMPLANT
GUIDE PIN 3.2 LONG (PIN) ×2 IMPLANT
GUIDE PIN 3.2MM (MISCELLANEOUS) ×1
GUIDE PIN ORTH 343X3.2XBRAD (MISCELLANEOUS) ×1 IMPLANT
HIP SCREW SET (Screw) ×2 IMPLANT
KIT BASIN OR (CUSTOM PROCEDURE TRAY) ×2 IMPLANT
NAIL IMHS 10X42 HIP (Nail) ×2 IMPLANT
PACK GENERAL/GYN (CUSTOM PROCEDURE TRAY) ×2 IMPLANT
PADDING CAST COTTON 6X4 STRL (CAST SUPPLIES) ×2 IMPLANT
POSITIONER SURGICAL ARM (MISCELLANEOUS) ×2 IMPLANT
SCREW COMPRESSION (Screw) ×2 IMPLANT
SCREW LAG 110MM (Screw) ×2 IMPLANT
SPONGE GAUZE 4X4 12PLY (GAUZE/BANDAGES/DRESSINGS) ×2 IMPLANT
SPONGE LAP 4X18 X RAY DECT (DISPOSABLE) ×2 IMPLANT
STAPLER VISISTAT (STAPLE) ×2 IMPLANT
SUT ETHILON 3 0 PS 1 (SUTURE) ×2 IMPLANT
SUT VIC AB 0 CT1 27 (SUTURE) ×2
SUT VIC AB 0 CT1 27XBRD ANTBC (SUTURE) ×2 IMPLANT
SUT VIC AB 2-0 CT1 27 (SUTURE) ×2
SUT VIC AB 2-0 CT1 TAPERPNT 27 (SUTURE) ×2 IMPLANT
TOWEL OR 17X26 10 PK STRL BLUE (TOWEL DISPOSABLE) ×6 IMPLANT

## 2012-10-21 NOTE — Preoperative (Signed)
Beta Blockers   Reason not to administer Beta Blockers:Not Applicable 

## 2012-10-21 NOTE — Brief Op Note (Signed)
10/20/2012 - 10/21/2012  9:12 PM  PATIENT:  Seth Hunter  79 y.o. male  PRE-OPERATIVE DIAGNOSIS:  left hip fracture  POST-OPERATIVE DIAGNOSIS:  left hip fracture  PROCEDURE:  Procedure(s): INTRAMEDULLARY (IM) NAIL FEMORAL  SURGEON:  Surgeon(s): Cammy Copa, MD  ASSISTANT: none  ANESTHESIA:   general  EBL: 50 ml    Total I/O In: -  Out: 150 [Urine:100; Blood:50]  BLOOD ADMINISTERED: none  DRAINS: none   LOCAL MEDICATIONS USED:  none  SPECIMEN:  No Specimen  COUNTS:  YES  TOURNIQUET:  * No tourniquets in log *  DICTATION: .Other Dictation: Dictation Number [id number]  PLAN OF CARE: Admit to inpatient   PATIENT DISPOSITION:  PACU - hemodynamically stable

## 2012-10-21 NOTE — Progress Notes (Signed)
ANTICOAGULATION CONSULT NOTE - Initial Consult  Pharmacy Consult for Warfarin Indication: VTE prophylaxis  Allergies  Allergen Reactions  . Penicillins Other (See Comments)    unknown    Patient Measurements: Height: 5\' 7"  (170.2 cm) Weight: 173 lb 4.8 oz (78.608 kg) IBW/kg (Calculated) : 66.1 Heparin Dosing Weight:   Vital Signs: Temp: 98.5 F (36.9 C) (10/06 2229) Temp src: Oral (10/06 1450) BP: 104/67 mmHg (10/06 2229) Pulse Rate: 86 (10/06 2229)  Labs:  Recent Labs  10/20/12 1840 10/21/12 0355  HGB 14.4 14.1  HCT 42.2 40.8  PLT 311 272  LABPROT 12.3  --   INR 0.93  --   CREATININE 0.90 0.84  TROPONINI <0.30  --     Estimated Creatinine Clearance: 52.5 ml/min (by C-G formula based on Cr of 0.84).   Medical History: Past Medical History  Diagnosis Date  . Syncope     Syncope in the setting of complete heart block status post permanent pacemaker placement on September 27, 2007  . Hypertension   . Dementia     thought this appears to be mild based on reports of family as of 8/11  . Elevated TSH     Mildly elevated TSH  . Atrioventricular block, complete   . Pacemaker -MDT   . COPD (chronic obstructive pulmonary disease) 04/1995    X-ray  . BPH (benign prostatic hyperplasia)   . Tremor   . Pneumonia 04/1995    Bilateral upper lobes  . Anemia 04/1995    Post op, 2nd to hemorrhage, left leg  . UTI (urinary tract infection) 05/1995  . DJD (degenerative joint disease), lumbar 04/1995    Spine, severe.   X-ray  . DJD (degenerative joint disease) 04/1995    Both shoulders, severe  . HOH (hard of hearing)   . Inguinal hernia     Medications:  Prescriptions prior to admission  Medication Sig Dispense Refill  . aspirin 81 MG tablet Take 81 mg by mouth daily.      . finasteride (PROSCAR) 5 MG tablet Take 1 tablet (5 mg total) by mouth daily.  90 tablet  3  . Garlic 400 MG TABS Take 400 mg by mouth daily.      . hydrochlorothiazide (MICROZIDE) 12.5 MG  capsule Take 12.5 mg by mouth every morning.        Assessment: Patient s/p ortho surgery.  Warfarin for VTE prophylaxis per pharmacy ordered.    Goal of Therapy:  INR 2-3    Plan:  Start with Coumadin 3mg  tonight. Check PT/INR daily. Provide Coumadin education.   Aleene Davidson Crowford 10/21/2012,11:06 PM

## 2012-10-21 NOTE — Progress Notes (Signed)
[facility] PROGRESS NOTE  GABRIELLE MESTER ZOX:[id number] DOB: 05-Aug-1918 DOA: 10/20/2012 PCP: Crawford Givens, MD  Brief narrative: 79 y.o. male with past medical history significant for dementia, hypertension, BPH, AV block and pacemaker placement who presented to [facility] ED 10/20/2012 status post fall at home. Patient was found to have intertrochanteric left femoral fracture on admission. Vital signs were stable with blood pressure 120/67 and heart rate of 71, oxygen saturation of 98% on room air on admission. CBC revealed leukocytosis at 18.7 but the rest of the blood work was essentially unremarkable.  Assessment and plan:  Principal Problem:   Closed left hip fracture - Intertrochanteric left femoral fracture - Management per orthopedic surgery; plan for surgery tonight - Pain management with morphine 0.5 mg IV every 2 hours as needed for severe pain, Norco every 6 hours by mouth as needed for moderate pain; patient also has fentanyl 12.5 mcg IV as needed every 30 minutes for breakthrough and/or severe pain not managed with morphine Active Problems:   ESSENTIAL HYPERTENSION - Continue HCTZ   BPH (benign prostatic hyperplasia) - Continue finasteride   Leukocytosis - Likely reactive in trending down to 12.2 without antibiotics - No reports of fevers   Pacemaker -MDT - Stable  Consultants:  Orthopedic surgery  Procedures/Studies: Dg Chest 1 View 10/20/2012    IMPRESSION: No acute cardiopulmonary disease.     Dg Hip Complete Left 10/20/2012   IMPRESSION: Comminuted intertrochanteric left femur fracture.     Antibiotics:  None   Code Status: DNR/DNI Family Communication: Pt at bedside Disposition Plan: Home when medically stable  HPI/Subjective: No events overnight.   Objective: Filed Vitals:   10/21/12 0540 10/21/12 0800 10/21/12 1111 10/21/12 1155  BP: 124/59   121/65  Pulse: 93   91  Temp: 98.4 F (36.9 C)   98.3 F (36.8 C)  TempSrc: Oral   Oral  Resp: 20 18 18 18    Height:      Weight:      SpO2: 100%   99%    Intake/Output Summary (Last 24 hours) at 10/21/12 1248 Last data filed at 10/21/12 1100  Gross per 24 hour  Intake      3 ml  Output    675 ml  Net   -672 ml    Exam:   General:  Pt is alert, follows commands appropriately, not in acute distress  Cardiovascular: Regular rate and rhythm, S1/S2, no murmurs, no rubs, no gallops  Respiratory: Clear to auscultation bilaterally, no wheezing, no crackles, no rhonchi  Abdomen: Soft, non tender, non distended, bowel sounds present, no guarding  Extremities: Pulses DP and PT palpable bilaterally  Neuro: Grossly nonfocal  Data Reviewed: Basic Metabolic Panel:  Recent Labs Lab 10/20/12 1840 10/21/12 0355  NA 137 136  K 4.0 4.6  CL 100 101  CO2 27 25  GLUCOSE 116* 115*  BUN 21 19  CREATININE 0.90 0.84  CALCIUM 9.8 9.2  MG  --  1.9  PHOS  --  3.5   Liver Function Tests:  Recent Labs Lab 10/21/12 0355  AST 33  ALT 14  ALKPHOS 81  BILITOT 0.9  PROT 6.6  ALBUMIN 3.1*   No results found for this basename: LIPASE, AMYLASE,  in the last 168 hours No results found for this basename: AMMONIA,  in the last 168 hours CBC:  Recent Labs Lab 10/20/12 1840 10/21/12 0355  WBC 18.7* 12.2*  NEUTROABS 14.6*  --   HGB 14.4 14.1  HCT  42.2 40.8  MCV 93.6 93.6  PLT 311 272   Cardiac Enzymes:  Recent Labs Lab 10/20/12 1840  TROPONINI <0.30   BNP: No components found with this basename: POCBNP,  CBG: No results found for this basename: GLUCAP,  in the last 168 hours  No results found for this or any previous visit (from the past 240 hour(s)).   Scheduled Meds: .  aspirin EC  81 mg Oral Daily  . docusate sodium  100 mg Oral BID  . finasteride  5 mg Oral Daily  . hydrochlorothiazide  12.5 mg Oral Daily    Debbora Presto, MD  [facility] Pager [phone]  If 7PM-7AM, please contact night-coverage [email or web address] Password [email or web address] 10/21/2012, 12:48 PM   LOS: 1 day

## 2012-10-21 NOTE — Transfer of Care (Signed)
Immediate Anesthesia Transfer of Care Note  Patient: Seth Hunter  Procedure(s) Performed: Procedure(s) (LRB): INTRAMEDULLARY (IM) NAIL FEMORAL (Left)  Patient Location: PACU  Anesthesia Type: General  Level of Consciousness: sedated, patient cooperative and responds to stimulation  Airway & Oxygen Therapy: Patient Spontanous Breathing and Patient connected to face mask oxgen  Post-op Assessment: Report given to PACU RN and Post -op Vital signs reviewed and stable  Post vital signs: Reviewed and stable  Complications: No apparent anesthesia complications

## 2012-10-21 NOTE — Anesthesia Preprocedure Evaluation (Addendum)
Anesthesia Evaluation  Patient identified by MRN, date of birth, ID band Patient awake    Reviewed: Allergy & Precautions, H&P , NPO status , Patient's Chart, lab work & pertinent test results  Airway Mallampati: II TM Distance: >3 FB Neck ROM: full    Dental  (+) Dental Advisory Given, Edentulous Lower and Missing All upper front teeth missing:   Pulmonary COPD History PE.  Mild COPD breath sounds clear to auscultation  Pulmonary exam normal       Cardiovascular hypertension, + pacemaker Rhythm:regular Rate:Normal     Neuro/Psych dementia negative neurological ROS  negative psych ROS   GI/Hepatic negative GI ROS, Neg liver ROS,   Endo/Other  negative endocrine ROS  Renal/GU negative Renal ROS  negative genitourinary   Musculoskeletal   Abdominal   Peds  Hematology negative hematology ROS (+)   Anesthesia Other Findings   Reproductive/Obstetrics negative OB ROS                          Anesthesia Physical Anesthesia Plan  ASA: III  Anesthesia Plan: General   Post-op Pain Management:    Induction: Intravenous  Airway Management Planned: Oral ETT  Additional Equipment:   Intra-op Plan:   Post-operative Plan: Extubation in OR  Informed Consent: I have reviewed the patients History and Physical, chart, labs and discussed the procedure including the risks, benefits and alternatives for the proposed anesthesia with the patient or authorized representative who has indicated his/her understanding and acceptance.   Dental Advisory Given  Plan Discussed with: CRNA and Surgeon  Anesthesia Plan Comments:         Anesthesia Quick Evaluation

## 2012-10-21 NOTE — Consult Note (Signed)
Reason for Consult: Left hip pain Referring Physician: Dr. Reatha Armour Seth Hunter is an 79 y.o. male.  HPI: Seth Hunter is a 79 year old patient who lives along at home who had a mechanical fall yesterday. He denies loss of consciousness and denies other orthopedic complaints. He reports left hip pain and inability to ambulate. Patient does have a history of pulmonary embolism. He has a niece who is his closest family.  Past Medical History  Diagnosis Date  . Syncope     Syncope in the setting of complete heart block status post permanent pacemaker placement on September 27, 2007  . Hypertension   . Dementia     thought this appears to be mild based on reports of family as of 8/11  . Elevated TSH     Mildly elevated TSH  . Atrioventricular block, complete   . Pacemaker -MDT   . COPD (chronic obstructive pulmonary disease) 04/1995    X-ray  . BPH (benign prostatic hyperplasia)   . Tremor   . Pneumonia 04/1995    Bilateral upper lobes  . Anemia 04/1995    Post op, 2nd to hemorrhage, left leg  . UTI (urinary tract infection) 05/1995  . DJD (degenerative joint disease), lumbar 04/1995    Spine, severe.   X-ray  . DJD (degenerative joint disease) 04/1995    Both shoulders, severe  . HOH (hard of hearing)   . Inguinal hernia     Past Surgical History  Procedure Laterality Date  . Pacemaker insertion    . Cholecystectomy  1988  . Hernia repair  Years ago    Left inguinal  . Ventral hernia repair  04/1995    with intra abdominal adhesions, lysis incidental appendectomy  . Pulmonary embolism surgery  04/1995    Post-op  . 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 Hematoma, left leg  05/1995    Probably 2nd to anticoag  . 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 Fractured left wrist  1990    with old right posterior rib fracture  . Syncope comp heart block  9/10- 09/30/2007    HOSP Perm Pacer placed, HTN, dementia, mildly elevated  TSH  . Pacer placement (dr. [name])  09/27/2007  . Ct angio chest  09/27/07    No PE, No acute prob    Family History  Problem Relation Age of Onset  . Heart disease Father     Hardening of the arteries, heart trouble    Social History:  reports that he has never smoked. He does not have any smokeless tobacco history on file. He reports that he does not drink alcohol or use illicit drugs.  Allergies:  Allergies  Allergen Reactions  . Penicillins Other (See Comments)    unknown    Medications: I have reviewed the patient's current medications.  Results for orders placed during the hospital encounter of 10/20/12 (from the past 48 hour(s))  ABO/RH     Status: None   Collection Time    10/20/12  6:19 PM      Result Value Range   ABO/RH(D) AB POS    BASIC METABOLIC PANEL     Status: Abnormal   Collection Time    10/20/12  6:40 PM      Result Value Range   Sodium 137  135 - 145 mEq/L   Potassium 4.0  3.5 - 5.1 mEq/L   Chloride 100  96 -  112 mEq/L   CO2 27  19 - 32 mEq/L   Glucose, Bld 116 (*) 70 - 99 mg/dL   BUN 21  6 - 23 mg/dL   Creatinine, Ser 8.29  0.50 - 1.35 mg/dL   Calcium 9.8  8.4 - 56.2 mg/dL   GFR calc non Af Amer 72 (*) >90 mL/min   GFR calc Af Amer 83 (*) >90 mL/min   Comment: (NOTE)     The eGFR has been calculated using the CKD EPI equation.     This calculation has not been validated in all clinical situations.     eGFR's persistently <90 mL/min signify possible Chronic Kidney     Disease.  CBC WITH DIFFERENTIAL     Status: Abnormal   Collection Time    10/20/12  6:40 PM      Result Value Range   WBC 18.7 (*) 4.0 - 10.5 K/uL   RBC 4.51  4.22 - 5.81 MIL/uL   Hemoglobin 14.4  13.0 - 17.0 g/dL   HCT 13.0  86.5 - 78.4 %   MCV 93.6  78.0 - 100.0 fL   MCH 31.9  26.0 - 34.0 pg   MCHC 34.1  30.0 - 36.0 g/dL   RDW 69.6  29.5 - 28.4 %   Platelets 311  150 - 400 K/uL   Neutrophils Relative % 78 (*) 43 - 77 %   Neutro Abs 14.6 (*) 1.7 - 7.7 K/uL   Lymphocytes  Relative 13  12 - 46 %   Lymphs Abs 2.4  0.7 - 4.0 K/uL   Monocytes Relative 8  3 - 12 %   Monocytes Absolute 1.5 (*) 0.1 - 1.0 K/uL   Eosinophils Relative 1  0 - 5 %   Eosinophils Absolute 0.2  0.0 - 0.7 K/uL   Basophils Relative 0  0 - 1 %   Basophils Absolute 0.0  0.0 - 0.1 K/uL  PROTIME-INR     Status: None   Collection Time    10/20/12  6:40 PM      Result Value Range   Prothrombin Time 12.3  11.6 - 15.2 seconds   INR 0.93  0.00 - 1.49  TYPE AND SCREEN     Status: None   Collection Time    10/20/12  6:40 PM      Result Value Range   ABO/RH(D) AB POS     Antibody Screen NEG     Sample Expiration 10/23/2012    TROPONIN I     Status: None   Collection Time    10/20/12  6:40 PM      Result Value Range   Troponin I <0.30  <0.30 ng/mL   Comment:            Due to the release kinetics of cTnI,     a negative result within the first hours     of the onset of symptoms does not rule out     myocardial infarction with certainty.     If myocardial infarction is still suspected,     repeat the test at appropriate intervals.  URINALYSIS, ROUTINE W REFLEX MICROSCOPIC     Status: None   Collection Time    10/20/12  8:06 PM      Result Value Range   Color, Urine YELLOW  YELLOW   APPearance CLEAR  CLEAR   Specific Gravity, Urine 1.022  1.005 - 1.030   pH 8.0  5.0 - 8.0   Glucose,  UA NEGATIVE  NEGATIVE mg/dL   Hgb urine dipstick NEGATIVE  NEGATIVE   Bilirubin Urine NEGATIVE  NEGATIVE   Ketones, ur NEGATIVE  NEGATIVE mg/dL   Protein, ur NEGATIVE  NEGATIVE mg/dL   Urobilinogen, UA 1.0  0.0 - 1.0 mg/dL   Nitrite NEGATIVE  NEGATIVE   Leukocytes, UA NEGATIVE  NEGATIVE   Comment: MICROSCOPIC NOT DONE ON URINES WITH NEGATIVE PROTEIN, BLOOD, LEUKOCYTES, NITRITE, OR GLUCOSE <1000 mg/dL.  MAGNESIUM     Status: None   Collection Time    10/21/12  3:55 AM      Result Value Range   Magnesium 1.9  1.5 - 2.5 mg/dL  PHOSPHORUS     Status: None   Collection Time    10/21/12  3:55 AM       Result Value Range   Phosphorus 3.5  2.3 - 4.6 mg/dL  COMPREHENSIVE METABOLIC PANEL     Status: Abnormal   Collection Time    10/21/12  3:55 AM      Result Value Range   Sodium 136  135 - 145 mEq/L   Potassium 4.6  3.5 - 5.1 mEq/L   Comment: MODERATE HEMOLYSIS     HEMOLYSIS AT THIS LEVEL MAY AFFECT RESULT   Chloride 101  96 - 112 mEq/L   CO2 25  19 - 32 mEq/L   Glucose, Bld 115 (*) 70 - 99 mg/dL   BUN 19  6 - 23 mg/dL   Creatinine, Ser 1.61  0.50 - 1.35 mg/dL   Calcium 9.2  8.4 - 09.6 mg/dL   Total Protein 6.6  6.0 - 8.3 g/dL   Albumin 3.1 (*) 3.5 - 5.2 g/dL   AST 33  0 - 37 U/L   ALT 14  0 - 53 U/L   Alkaline Phosphatase 81  39 - 117 U/L   Total Bilirubin 0.9  0.3 - 1.2 mg/dL   GFR calc non Af Amer 74 (*) >90 mL/min   GFR calc Af Amer 85 (*) >90 mL/min   Comment: (NOTE)     The eGFR has been calculated using the CKD EPI equation.     This calculation has not been validated in all clinical situations.     eGFR's persistently <90 mL/min signify possible Chronic Kidney     Disease.  CBC     Status: Abnormal   Collection Time    10/21/12  3:55 AM      Result Value Range   WBC 12.2 (*) 4.0 - 10.5 K/uL   Comment: WHITE COUNT CONFIRMED ON SMEAR   RBC 4.36  4.22 - 5.81 MIL/uL   Hemoglobin 14.1  13.0 - 17.0 g/dL   HCT 04.5  40.9 - 81.1 %   MCV 93.6  78.0 - 100.0 fL   MCH 32.3  26.0 - 34.0 pg   MCHC 34.6  30.0 - 36.0 g/dL   RDW 91.4  78.2 - 95.6 %   Platelets 272  150 - 400 K/uL    Dg Chest 1 View  10/20/2012   CLINICAL DATA:  Fall. Left hip pain. Deformity.  EXAM: CHEST - 1 VIEW  COMPARISON:  09/11/2011.  FINDINGS: Lead left subclavian cardiac pacemaker. Chronic left rotator cuff tear with severe bilateral glenohumeral osteoarthritis.  Old right-sided rib fractures are present. The lungs appear clear aside from areas of pulmonary parenchymal scarring. Tortuous thoracic aorta. The cardiopericardial silhouette is within normal limits. Patient is mildly rotated to the right.  Clavicles appear intact.  Compared to  the prior exam, left basilar opacity has resolved.  IMPRESSION: No acute cardiopulmonary disease.   Electronically Signed   By: Andreas Newport M.D.   On: 10/20/2012 18:40   Dg Hip Complete Left  10/20/2012   CLINICAL DATA:  Fall. Left hip pain and deformity.  EXAM: LEFT HIP - COMPLETE 2+ VIEW  COMPARISON:  None.  FINDINGS: Comminuted intertrochanteric left femur fracture is present with apex superior and lateral angulation. Diffuse osteopenia. Grossly, the obturator rings appear intact. The right hip is externally rotated. 16 mm of displacement of the lesser trochanter fragment from the left hip. Left femoral head remains located.  IMPRESSION: Comminuted intertrochanteric left femur fracture.   Electronically Signed   By: Andreas Newport M.D.   On: 10/20/2012 18:38    Review of Systems  Constitutional: Negative.   HENT: Negative.   Eyes: Negative.   Respiratory: Negative.   Cardiovascular: Negative.   Gastrointestinal: Negative.   Genitourinary: Negative.   Musculoskeletal: Positive for joint pain.  Skin: Negative.   Endo/Heme/Allergies: Negative.   Psychiatric/Behavioral: Negative.    Blood pressure 124/59, pulse 93, temperature 98.4 F (36.9 C), temperature source Oral, resp. rate 20, height 5\' 7"  (1.702 m), weight 78.608 kg (173 lb 4.8 oz), SpO2 100.00%. Physical Exam  Constitutional: He appears well-developed.  HENT:  Head: Normocephalic.  Eyes: Pupils are equal, round, and reactive to light.  Neck: Normal range of motion.  Cardiovascular: Normal rate.   Respiratory: Effort normal.  GI: Soft.  Neurological: He is alert.  Skin: Skin is warm.  Psychiatric: He has a normal mood and affect.   examination of the left leg demonstrates shortening but his dorsiflexion plantarflexion is intact both feet are perfused and sensate. 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 Bilateral  upper extremities demonstrate mild synovitis in both wrists but reasonable grip strength and palpable radial pulses bilaterally good elbow shoulder range of motion bilaterally without crepitus or pain  Assessment/Plan: Impression is left hip comminuted intertrochanteric fracture in an angulatory patient who lives alone at home. He does use a walker. Patient does have significant medical comorbidities including history of pulmonary embolism and pacemaker placement. He is currently on telemetry. Patient also hard of hearing. I did discuss with him at length this morning the risk and benefits of surgical intervention including but not limited to infection nonunion need for more surgery as well as prolonged period of partial weightbearing. Patient understands risk and benefits and wants me to "do a good job". All questions answered. Plan for surgery tonight Lyllian Gause SCOTT 10/21/2012, 7:07 AM

## 2012-10-21 NOTE — Anesthesia Postprocedure Evaluation (Addendum)
  Anesthesia Post-op Note  Patient: Seth Hunter  Procedure(s) Performed: Procedure(s) (LRB): INTRAMEDULLARY (IM) NAIL FEMORAL (Left)  Patient Location: PACU  Anesthesia Type: General  Level of Consciousness: awake and alert   Airway and Oxygen Therapy: Patient Spontanous Breathing  Post-op Pain: mild  Post-op Assessment: Post-op Vital signs reviewed, Patient's Cardiovascular Status Stable, Respiratory Function Stable, Patent Airway and No signs of Nausea or vomiting  Last Vitals:  Filed Vitals:   10/21/12 2229  BP: 104/67  Pulse: 86  Temp: 36.9 C  Resp: 20    Post-op Vital Signs: stable   Complications: No apparent anesthesia complications

## 2012-10-22 DIAGNOSIS — N400 Enlarged prostate without lower urinary tract symptoms: Secondary | ICD-10-CM | POA: Diagnosis not present

## 2012-10-22 DIAGNOSIS — S72009A Fracture of unspecified part of neck of unspecified femur, initial encounter for closed fracture: Secondary | ICD-10-CM | POA: Diagnosis not present

## 2012-10-22 LAB — CBC
HCT: 39.5 % (ref 39.0–52.0)
MCH: 31.9 pg (ref 26.0–34.0)
MCHC: 33.7 g/dL (ref 30.0–36.0)
RBC: 4.17 MIL/uL — ABNORMAL LOW (ref 4.22–5.81)
RDW: 14.4 % (ref 11.5–15.5)

## 2012-10-22 LAB — BASIC METABOLIC PANEL
BUN: 21 mg/dL (ref 6–23)
Calcium: 9.1 mg/dL (ref 8.4–10.5)
Chloride: 99 mEq/L (ref 96–112)
Creatinine, Ser: 1.09 mg/dL (ref 0.50–1.35)
GFR calc Af Amer: 66 mL/min — ABNORMAL LOW (ref >90)
GFR calc non Af Amer: 57 mL/min — ABNORMAL LOW (ref >90)
Glucose, Bld: 114 mg/dL — ABNORMAL HIGH (ref 70–99)
Potassium: 4.4 mEq/L (ref 3.5–5.1)
Sodium: 136 mEq/L (ref 135–145)

## 2012-10-22 MED ORDER — HYDROCODONE-ACETAMINOPHEN 5-325 MG PO TABS: 1.0000 | ORAL_TABLET | Freq: Four times a day (QID) | ORAL | Status: DC | PRN

## 2012-10-22 MED ORDER — ALUM & MAG HYDROXIDE-SIMETH 200-200-20 MG/5ML PO SUSP
15.0000 mL | Freq: Four times a day (QID) | ORAL | Status: DC | PRN
  Administered 2012-10-23: 15 mL via ORAL
  Filled 2012-10-22: qty 30

## 2012-10-22 MED ORDER — WARFARIN SODIUM 5 MG PO TABS: 5.0000 mg | ORAL_TABLET | Freq: Every day | ORAL | Status: DC

## 2012-10-22 MED ORDER — SENNOSIDES-DOCUSATE SODIUM 8.6-50 MG PO TABS: 1.0000 | ORAL_TABLET | ORAL | Status: DC | PRN

## 2012-10-22 MED ORDER — WARFARIN SODIUM 1 MG PO TABS
1.0000 mg | ORAL_TABLET | Freq: Once | ORAL | Status: AC
  Administered 2012-10-22: 1 mg via ORAL
  Filled 2012-10-22: qty 1

## 2012-10-22 NOTE — Evaluation (Signed)
Occupational Therapy Evaluation Patient Details Name: Seth Hunter MRN: [id number] DOB: [birth year] Today's Date: 10/22/2012 Time: 8469-6295 OT Time Calculation (min): 30 min  OT Assessment / Plan / Recommendation History of present illness Seth Hunter is a 79 y.o. male with hx including sncope, hypertension, DDD, dementia, who fell at home and sustained an IT fracture.  He underwent an ORIF 10/21/12   Clinical Impression   Pt was admitted for the above.  He is TDWB on LLE and will benefit from skilled OT.  Prior to admission, pt was mod I with adls.  He is currently A x 2, 0-20% for LB.      OT Assessment  Patient needs continued OT Services    Follow Up Recommendations  SNF    Barriers to Discharge      Equipment Recommendations   (to be further assessed at snf)    Recommendations for Other Services    Frequency  Min 2X/week    Precautions / Restrictions Precautions Precautions: Fall Restrictions Weight Bearing Restrictions: Yes LLE Weight Bearing: Touchdown weight bearing Other Position/Activity Restrictions: 10%   Pertinent Vitals/Pain C/o L hip pain.  Not rated.  RN brought IV pain meds and repositioned in bed with ice.      ADL  Eating/Feeding: Set up Where Assessed - Eating/Feeding: Chair Grooming: Set up Where Assessed - Grooming: Supported sitting Upper Body Bathing: Minimal assistance Where Assessed - Upper Body Bathing: Supported sitting Lower Body Bathing: +2 Total assistance Lower Body Bathing: Patient Percentage: 20% Where Assessed - Lower Body Bathing: Supported sit to stand Upper Body Dressing: Minimal assistance Where Assessed - Upper Body Dressing: Supported sitting Lower Body Dressing: +2 Total assistance Lower Body Dressing: Patient Percentage: 0% Where Assessed - Lower Body Dressing: Supported sit to stand Toilet Transfer: Simulated;+2 Total assistance Toilet Transfer: Patient Percentage: 30% Statistician Method: Stand pivot (to R) Product/process development scientist:  (chair to bed) Toileting - Clothing Manipulation and Hygiene: +2 Total assistance Toileting - Clothing Manipulation and Hygiene: Patient Percentage: 0% Equipment Used: Rolling walker Transfers/Ambulation Related to ADLs: SPT back to bed.  Pt has a tendency to lean posteriorly.  Has bil UE tremor.  Was not able to maintain TDWB independently ADL Comments: pain and general weakness limit adls.  Pt has bil tremor:  will assess for ability to use AE on next visit.      OT Diagnosis: Generalized weakness  OT Problem List: Decreased strength;Decreased activity tolerance;Impaired balance (sitting and/or standing);Decreased knowledge of use of DME or AE;Decreased knowledge of precautions;Cardiopulmonary status limiting activity;Pain;Impaired UE functional use OT Treatment Interventions: Self-care/ADL training;DME and/or AE instruction;Therapeutic activities;Patient/family education;Balance training;Therapeutic exercise   OT Goals(Current goals can be found in the care plan section) Acute Rehab OT Goals Patient Stated Goal: none stated OT Goal Formulation: With patient Time For Goal Achievement: 11/05/12 Potential to Achieve Goals: Good ADL Goals Pt Will Perform Grooming: with set-up;sitting (unsupported sitting with min guard assist) Pt Will Transfer to Toilet: with +2 assist;squat pivot transfer;stand pivot transfer;bedside commode (pt 50%) Additional ADL Goal #1: Pt will perform 2 sets of 10 x AROM to bil UEs to increase strength/endurance for adls  Visit Information  Last OT Received On: 10/22/12 Assistance Needed: +2 History of Present Illness: Seth Hunter is a 79 y.o. male with hx including sncope, hypertension, DDD, dementia, who fell at home and sustained an IT fracture.  He underwent an ORIF 10/21/12       Prior Functioning  Home Living Family/patient expects to be discharged to:: Skilled nursing facility Living Arrangements: Alone Available Help at  Discharge: Available PRN/intermittently;Family Type of Home: House Home Access: Stairs to enter Home Layout: Two level;Able to live on main level with bedroom/bathroom Home Equipment: Dan Humphreys - 2 wheels Additional Comments: pt uable to give information Prior Function Level of Independence: Independent with assistive device(s) Comments: pt used wheelchair often at home; mod I with adls.   Communication Communication: HOH         Vision/Perception     Cognition  Cognition Arousal/Alertness: Awake/alert Behavior During Therapy: WFL for tasks assessed/performed Overall Cognitive Status: Within Functional Limits for tasks assessed Memory: Decreased recall of precautions;Decreased short-term memory    Extremity/Trunk Assessment Upper Extremity Assessment Upper Extremity Assessment: Generalized weakness (LUE painful. Only able to lift about 70 degrees) Lower Extremity Assessment Cervical / Trunk Assessment Cervical / Trunk Assessment: Kyphotic;Other exceptions Cervical / Trunk Exceptions: generalized muscle atrophy     Mobility Bed Mobility Sit to Supine: 1: +2 Total assist Sit to Supine: Patient Percentage: 10% Details for Bed Mobility Assistance: assist for trunk and LEs Transfers Sit to Stand: 1: +2 Total assist Sit to Stand: Patient Percentage: 30% Stand to Sit: 1: +2 Total assist Stand to Sit: Patient Percentage: 30% Details for Transfer Assistance: pt assisted to standing:  trunk flexed and posterior lean.  Pt was able to turn R foot towards bed; a lot of assistance was given to maintain balance due to lean.  Pt could not independently maintain TDWB     Exercise     Balance Balance Balance Assessed: Yes Static Sitting Balance Static Sitting - Balance Support: Bilateral upper extremity supported;Feet supported Static Sitting - Level of Assistance: 4: Min assist;3: Mod assist (fatiques quickly) Static Standing Balance Static Standing - Balance Support: Bilateral  upper extremity supported Static Standing - Level of Assistance: 1: +2 Total assist   End of Session OT - End of Session Activity Tolerance: Patient limited by fatigue;Patient limited by pain Patient left: in bed;with call bell/phone within reach;with bed alarm set;with nursing/sitter in room  GO     Theora Vankirk 10/22/2012, 2:56 PM Marica Otter, OTR/L [phone] 10/22/2012

## 2012-10-22 NOTE — Op Note (Signed)
NAME:  TRENTEN, WATCHMAN NO.:  [id number]  MEDICAL RECORD NO.:  [id number]  LOCATION:  1428                         FACILITY:  [facility]  PHYSICIAN:  Burnard Bunting, M.D.    DATE OF BIRTH:  March 11, 1918  DATE OF PROCEDURE: DATE OF DISCHARGE:                              OPERATIVE REPORT   PREOPERATIVE DIAGNOSIS:  Left hip intertrochanteric fracture.  POSTOPERATIVE DIAGNOSIS:  Left hip intertrochanteric fracture.  PROCEDURE:  Left hip intertrochanteric fracture, open reduction and internal fixation.  SURGEON:  Burnard Bunting, M.D.  ASSISTANT:  None.  ANESTHESIA:  General endotracheal.  ESTIMATED BLOOD LOSS:  50 mL.  INDICATIONS:  Ajai Harville is a patient with left hip fracture presents for operative management after explanation of risks and benefits.  COMPONENTS UTILIZED:  [employer]hew 10 x 42 nail with 115 mm lag screw and 1 compression screw.  No distal interlock.  PROCEDURE IN DETAIL:  The patient was brought to the operating room where general endotracheal anesthesia was induced.  Preoperative antibiotics were administered.  Time-out was called.  The patient was placed in traction with the right leg in lithotomy position, peroneal nerve well padded.  Right lateral leg was prescrubbed with alcohol and Betadine, which was allowed to air dry, prepped with DuraPrep solution, draped in sterile manner.  Ioban used to cover the operative field. Fracture was reduced with a combination of traction and internal rotation under fluoroscopic guidance, near anatomic reduction achieved. Guide pin then placed to the tip of the trochanter past the lesser trochanter, proximal reaming performed.  Nail then placed in accordance with preoperative templating, 10 x 42 mm nail placed through a separate incision.  Lag screw was placed across the fracture site within tip-apex distance very close to 5 mm.  This is in the AP and lateral views and did get through the healthy bone in  the inferior aspect of the femoral neck.  Compression screw applied.  Good reduction achieved.  Two incisions irrigated and closed using interrupted inverted 0 Vicryl suture, 0 Vicryl suture, and the skin staples.  Mepilex was applied.  The patient tolerated the procedure well without immediate complication.  Transferred to recovery room in stable condition.  Touchdown weightbearing to be employed.     Burnard Bunting, M.D.     GSD/MEDQ  D:  10/21/2012  T:  10/22/2012  Job:  [id number]

## 2012-10-22 NOTE — Progress Notes (Signed)
Subjective: Pt stable - in chair   Objective: Vital signs in last 24 hours: Temp:  [97.4 F (36.3 C)-98.5 F (36.9 C)] 97.4 F (36.3 C) (10/07 0526) Pulse Rate:  [29-144] 99 (10/07 0526) Resp:  [15-26] 21 (10/07 0526) BP: (90-132)/(39-88) 113/72 mmHg (10/07 0526) SpO2:  [93 %-100 %] 98 % (10/07 0526)  Intake/Output from previous day: 10/06 0701 - 10/07 0700 In: 1056.3 [I.V.:1006.3; IV Piggyback:50] Out: 600 [Urine:550; Blood:50] Intake/Output this shift:    Exam:  Neurovascular intact Sensation intact distally Intact pulses distally Dorsiflexion/Plantar flexion intact  Labs:  Recent Labs  10/20/12 1840 10/21/12 0355 10/22/12 0349  HGB 14.4 14.1 13.3    Recent Labs  10/21/12 0355 10/22/12 0349  WBC 12.2* 18.1*  RBC 4.36 4.17*  HCT 40.8 39.5  PLT 272 289    Recent Labs  10/21/12 0355 10/22/12 0349  NA 136 136  K 4.6 4.4  CL 101 99  CO2 25 26  BUN 19 21  CREATININE 0.84 1.09  GLUCOSE 115* 114*  CALCIUM 9.2 9.1    Recent Labs  10/20/12 1840 10/22/12 0349  INR 0.93 1.03    Assessment/Plan: Plan to mobilize tdwb lle - ready for snf from ortho perspective - dc with coumadin pain meds - hgb ok   Seth Hunter SCOTT 10/22/2012, 11:34 AM

## 2012-10-22 NOTE — Progress Notes (Signed)
Pt HR dropping into the high 30's since return from PACU, but is not sustained more than one minute. Other VS stable and patient asymptomatic. NP on call notified. No new orders received. Will continue to monitor.

## 2012-10-22 NOTE — Progress Notes (Signed)
Patient ID: Seth Hunter, male   DOB: 05/24/1918, 79 y.o.   MRN: [id number]  [facility] PROGRESS NOTE  Seth Hunter WUJ:[id number] DOB: 1918/08/17 DOA: 10/20/2012 PCP: Crawford Givens, MD  Brief narrative: 79 y.o. male with past medical history significant for dementia, hypertension, BPH, AV block and pacemaker placement who presented to [facility] ED 10/20/2012 status post fall at home. Patient was found to have intertrochanteric left femoral fracture on admission. Vital signs were stable with blood pressure 120/67 and heart rate of 71, oxygen saturation of 98% on room air on admission. CBC revealed leukocytosis at 18.7 but the rest of the blood work was essentially unremarkable.   Assessment and plan:  Principal Problem:  Closed left hip fracture  - Intertrochanteric left femoral fracture  - status post ORIF, post op day #1, pt doing well clinically and is hemodynamically stable this AM - Pain management with morphine 0.5 mg IV every 2 hours as needed for severe pain, Norco every 6 hours by mouth as needed for moderate pain - patient also has fentanyl 12.5 mcg IV as needed every 30 minutes for breakthrough and/or severe pain not managed with morphine  Active Problems:  ESSENTIAL HYPERTENSION  - Continue HCTZ  - remains well controlled inpatient  BPH (benign prostatic hyperplasia)  - Continue finasteride  Leukocytosis  - Likely reactive - No reports of fevers  - no clear infectious source, repeat CBC in AM Pacemaker -MDT  - Stable   Consultants:  Orthopedic surgery Procedures/Studies:  Dg Chest 1 View 10/20/2012 IMPRESSION: No acute cardiopulmonary disease.  Dg Hip Complete Left  10/20/2012 IMPRESSION: Comminuted intertrochanteric left femur fracture.  Antibiotics:  None   Code Status: DNR/DNI  Family Communication: Pt at bedside  Disposition Plan: D/C SNF when bed available    HPI/Subjective: No events overnight.   Objective: Filed Vitals:   10/21/12 2326 10/22/12 0219  10/22/12 0526 10/22/12 1300  BP: 107/54 102/52 113/72 118/88  Pulse: 59 55 99 79  Temp:  97.9 F (36.6 C) 97.4 F (36.3 C) 97.9 F (36.6 C)  TempSrc:  Oral Oral Oral  Resp:  20 21 22   Height:      Weight:      SpO2:  100% 98% 99%    Intake/Output Summary (Last 24 hours) at 10/22/12 1434 Last data filed at 10/22/12 0700  Gross per 24 hour  Intake 1056.25 ml  Output    600 ml  Net 456.25 ml    Exam:   General:  Pt is alert, follows commands appropriately, not in acute distress  Cardiovascular: Regular rate and rhythm, S1/S2, no murmurs, no rubs, no gallops  Respiratory: Clear to auscultation bilaterally, no wheezing, diminished breath sounds at bases   Abdomen: Soft, non tender, non distended, bowel sounds present, no guarding  Extremities: No edema, pulses DP and PT palpable bilaterally  Neuro: Grossly nonfocal  Data Reviewed: Basic Metabolic Panel:  Recent Labs Lab 10/20/12 1840 10/21/12 0355 10/22/12 0349  NA 137 136 136  K 4.0 4.6 4.4  CL 100 101 99  CO2 27 25 26   GLUCOSE 116* 115* 114*  BUN 21 19 21   CREATININE 0.90 0.84 1.09  CALCIUM 9.8 9.2 9.1  MG  --  1.9  --   PHOS  --  3.5  --    Liver Function Tests:  Recent Labs Lab 10/21/12 0355  AST 33  ALT 14  ALKPHOS 81  BILITOT 0.9  PROT 6.6  ALBUMIN 3.1*   CBC:  Recent Labs Lab 10/20/12 1840 10/21/12 0355 10/22/12 0349  WBC 18.7* 12.2* 18.1*  NEUTROABS 14.6*  --   --   HGB 14.4 14.1 13.3  HCT 42.2 40.8 39.5  MCV 93.6 93.6 94.7  PLT 311 272 289   Cardiac Enzymes:  Recent Labs Lab 10/20/12 1840  TROPONINI <0.30    Scheduled Meds: . aspirin EC  81 mg Oral Daily  . docusate sodium  100 mg Oral BID  . finasteride  5 mg Oral Daily  . hydrochlorothiazide  12.5 mg Oral Daily  . sodium chloride  3 mL Intravenous Q12H  . warfarin  1 mg Oral ONCE-1800  . Warfarin - Pharmacist Dosing Inpatient   Does not apply q1800   Continuous Infusions:    Debbora Presto,  MD  [facility] Pager [phone]  If 7PM-7AM, please contact night-coverage [email or web address] Password [email or web address] 10/22/2012, 2:34 PM   LOS: 2 days

## 2012-10-22 NOTE — Progress Notes (Signed)
ANTICOAGULATION CONSULT NOTE - Follow Up Consult  Pharmacy Consult for Warfarin Indication: VTE prophylaxis  Allergies  Allergen Reactions  . Penicillins Other (See Comments)    unknown    Patient Measurements: Height: 5\' 7"  (170.2 cm) Weight: 173 lb 4.8 oz (78.608 kg) IBW/kg (Calculated) : 66.1  Vital Signs: Temp: 97.4 F (36.3 C) (10/07 0526) Temp src: Oral (10/07 0526) BP: 113/72 mmHg (10/07 0526) Pulse Rate: 99 (10/07 0526)  Labs:  Recent Labs  10/20/12 1840 10/21/12 0355 10/22/12 0349  HGB 14.4 14.1 13.3  HCT 42.2 40.8 39.5  PLT 311 272 289  LABPROT 12.3  --  13.3  INR 0.93  --  1.03  CREATININE 0.90 0.84 1.09  TROPONINI <0.30  --   --     Estimated Creatinine Clearance: 40.4 ml/min (by C-G formula based on Cr of 1.09).   Medications:  Scheduled:  . aspirin EC  81 mg Oral Daily  . docusate sodium  100 mg Oral BID  . finasteride  5 mg Oral Daily  . hydrochlorothiazide  12.5 mg Oral Daily  . sodium chloride  3 mL Intravenous Q12H  . Warfarin - Pharmacist Dosing Inpatient   Does not apply q1800   Infusions:  . 0.9 % NaCl with KCl 20 mEq / L 75 mL/hr at 10/22/12 0015    Assessment: 79 yo male sustained hip fracture after falling at home, s/p ORIF. Started on warfarin for post-op VTE prophylaxis.  Warfarin 3mg  given ~1am  INR subtherapeutic as expected after 1st dose  CBC stable, no bleeding reported  Since patient has dementia, educated patient's niece via telephone regarding warfarin therapy. She demonstrated excellent understanding  Recommend holding garlic supplement while on warfarin  Goal of Therapy:  INR 2-3   Plan:   Warfarin 1mg  po x 1 today  Daily PT/INR while inpatient  D/C orders already entered by Ortho for warfarin 5mg  daily - do not anticipate patient will need this much given age 86. Need to monitor INR closely at SNF - suggest daily until stable, then weekly for the duration of therapy (anticipate 3 weeks postop)  Loralee Pacas, PharmD, BCPS Pager: [phone] 10/22/2012,1:50 PM

## 2012-10-22 NOTE — Evaluation (Signed)
Physical Therapy Evaluation Patient Details Name: Seth Hunter MRN: [id number] DOB: 10-Jan-1919 Today's Date: 10/22/2012 Time: 8295-6213 PT Time Calculation (min): 17 min  PT Assessment / Plan / Recommendation History of Present Illness  Seth Hunter is a 79 y.o. male with hx including sncope, hypertension, DDD, dementia, who fell at home and sustained an IT fracture.  He underwent an ORIF 10/21/12  Clinical Impression  Pt limited by post op pain and limited weight bearing status.  Anticipate he will need continued PT at SNF    PT Assessment  Patient needs continued PT services    Follow Up Recommendations  SNF    Does the patient have the potential to tolerate intense rehabilitation      Barriers to Discharge Decreased caregiver support      Equipment Recommendations  None recommended by PT    Recommendations for Other Services     Frequency Min 6X/week    Precautions / Restrictions Precautions Precautions: Fall Restrictions Weight Bearing Restrictions: Yes LLE Weight Bearing: Touchdown weight bearing Other Position/Activity Restrictions: 10%   Pertinent Vitals/Pain Pt in left leg.the patient needs pain med prior to mobility      Mobility  Bed Mobility Bed Mobility: Supine to Sit;Sit to Supine Supine to Sit: 1: +2 Total assist Supine to Sit: Patient Percentage: 30% Sit to Supine: 1: +2 Total assist Sit to Supine: Patient Percentage: 30% Details for Bed Mobility Assistance: pt with pain in leg that limits bed mobility.. Used flat spin technique with use of pad to pull pt to edge of the bed Transfers Transfers: Sit to Stand;Stand to Sit;Stand Pivot Transfers Sit to Stand: 1: +2 Total assist Sit to Stand: Patient Percentage: 30% Stand to Sit: 1: +2 Total assist Stand to Sit: Patient Percentage: 30% Stand Pivot Transfers: 1: +2 Total assist Stand Pivot Transfers: Patient Percentage: 30% Details for Transfer Assistance: Assist by lifting up on pad to bring pt up onto  his feet. Pt able to stand with +2 assist for < 10 sec., but unable to stand erect and was unable to follow commands to limit weight bearing. Ambulation/Gait Ambulation/Gait Assistance: Not tested (comment) Assistive device: Rolling walker Ambulation/Gait Assistance Details: stand only    Exercises     PT Diagnosis: Difficulty walking;Abnormality of gait;Acute pain  PT Problem List: Decreased strength;Pain;Decreased range of motion;Decreased activity tolerance;Decreased balance;Decreased mobility;Decreased safety awareness PT Treatment Interventions: DME instruction;Gait training;Functional mobility training;Therapeutic activities;Therapeutic exercise     PT Goals(Current goals can be found in the care plan section) Acute Rehab PT Goals Patient Stated Goal: none stated PT Goal Formulation: With patient Time For Goal Achievement: 11/05/12 Potential to Achieve Goals: Fair  Visit Information  Last PT Received On: 10/22/12 Assistance Needed: +2 History of Present Illness: Seth Hunter is a 79 y.o. male with hx including sncope, hypertension, DDD, dementia, who fell at home and sustained an IT fracture.  He underwent an ORIF 10/21/12       Prior Functioning  Home Living Family/patient expects to be discharged to:: Private residence Living Arrangements: Alone Available Help at Discharge: Available PRN/intermittently;Family Type of Home: House Home Access: Stairs to enter Home Layout: Two level;Able to live on main level with bedroom/bathroom Home Equipment: Dan Humphreys - 2 wheels Additional Comments: pt uable to give information Prior Function Level of Independence: Independent with assistive device(s) Communication Communication: HOH    Cognition  Cognition Arousal/Alertness: Awake/alert Behavior During Therapy: WFL for tasks assessed/performed Overall Cognitive Status: Within Functional Limits for tasks assessed Memory:  Decreased recall of precautions;Decreased short-term memory     Extremity/Trunk Assessment Lower Extremity Assessment Lower Extremity Assessment: Generalized weakness;RLE deficits/detail;LLE deficits/detail RLE Deficits / Details: Pt has limited AROM to command, but is observed to move leg randomly LLE Deficits / Details: pt with limited movement in leg due  to pain LLE: Unable to fully assess due to pain Cervical / Trunk Assessment Cervical / Trunk Assessment: Kyphotic;Other exceptions Cervical / Trunk Exceptions: generalized muscle atrophy   Balance Balance Balance Assessed: Yes Static Sitting Balance Static Sitting - Balance Support: Bilateral upper extremity supported;Feet supported Static Sitting - Level of Assistance: 3: Mod assist Static Standing Balance Static Standing - Balance Support: Bilateral upper extremity supported Static Standing - Level of Assistance: 1: +2 Total assist  End of Session PT - End of Session Activity Tolerance: Patient limited by pain Patient left: in chair Nurse Communication: Mobility status  GP    Bayard Hugger. [place], [place] [phone] 10/22/2012, 1:16 PM

## 2012-10-22 NOTE — Progress Notes (Signed)
Clinical Social Work Department CLINICAL SOCIAL WORK PLACEMENT NOTE 10/22/2012  Patient:  NIKOLAY, DEMETRIOU  Account Number:  [id number] Admit date:  10/20/2012  Clinical Social Worker:  Orpah Greek  Date/time:  10/22/2012 04:15 PM  Clinical Social Work is seeking post-discharge placement for this patient at the following level of care:   SKILLED NURSING   (*CSW will update this form in Epic as items are completed)   10/22/2012  Patient/family provided with [facility] Department of Clinical Social Work's list of facilities offering this level of care within the geographic area requested by the patient (or if unable, by the patient's family).  10/22/2012  Patient/family informed of their freedom to choose among providers that offer the needed level of care, that participate in Medicare, Medicaid or managed care program needed by the patient, have an available bed and are willing to accept the patient.  10/22/2012  Patient/family informed of [facility]' ownership interest in [facility], as well as of the fact that they are under no obligation to receive care at this facility.  PASARR submitted to EDS on 10/22/2012 PASARR number received from EDS on 10/22/2012  FL2 transmitted to all facilities in geographic area requested by pt/family on  10/22/2012 FL2 transmitted to all facilities within larger geographic area on   Patient informed that his/her managed care company has contracts with or will negotiate with  certain facilities, including the following:     Patient/family informed of bed offers received:  10/22/2012 Patient chooses bed at [facility] & REHABILITATION Physician recommends and patient chooses bed at    Patient to be transferred to [facility] & [facility] on   Patient to be transferred to facility by   The following physician request were entered in Epic:   Additional Comments:   Unice Bailey, LCSW [facility] Clinical Social Worker cell #: [phone]

## 2012-10-22 NOTE — Progress Notes (Signed)
Clinical Social Work Department BRIEF PSYCHOSOCIAL ASSESSMENT 10/22/2012  Patient:  Seth Hunter, Seth Hunter     Account Number:  [id number]     Admit date:  10/20/2012  Clinical Social Worker:  Orpah Greek  Date/Time:  10/22/2012 04:12 PM  Referred by:  Physician  Date Referred:  10/22/2012 Referred for  SNF Placement   Other Referral:   Interview type:  Family Other interview type:   patient's niece, Sandy via phone    PSYCHOSOCIAL DATA Living Status:  ALONE Admitted from facility:   Level of care:   Primary support name:  Particia Nearing (niece) cell#: [phone] Primary support relationship to patient:  FAMILY Degree of support available:   good    CURRENT CONCERNS Current Concerns  Post-Acute Placement   Other Concerns:    SOCIAL WORK ASSESSMENT / PLAN CSW received referral for SNF placement.   Assessment/plan status:  Information/Referral to [facility] Other assessment/ plan:   Information/referral to community resources:   CSW completed FL2 and faxed information to [facility], confirmed with Bjorn Loser that they are able to offer a bed.    PATIENT'S/FAMILY'S RESPONSE TO PLAN OF CARE: Patient's niece states that he has been to [facility] before (August 2013) and is pleased to hear that they would be able to take him back. Anticipating possible discharge tomorrow.       Unice Bailey, LCSW [facility] Clinical Social Worker cell #: [phone]

## 2012-10-22 NOTE — Progress Notes (Signed)
Removed foley at 1100. Patient tolerating PO diet and IV fluids were discontinued. Patient without the urge to void. Bladder scanned patient at 1800 with only 186 ml. Notified MD and will continue to monitor.

## 2012-10-23 ENCOUNTER — Encounter (HOSPITAL_COMMUNITY): Payer: Self-pay | Admitting: Orthopedic Surgery

## 2012-10-23 ENCOUNTER — Non-Acute Institutional Stay (SKILLED_NURSING_FACILITY): Payer: Medicare Other | Admitting: Internal Medicine

## 2012-10-23 DIAGNOSIS — Z5189 Encounter for other specified aftercare: Secondary | ICD-10-CM | POA: Diagnosis not present

## 2012-10-23 DIAGNOSIS — L8995 Pressure ulcer of unspecified site, unstageable: Secondary | ICD-10-CM | POA: Diagnosis not present

## 2012-10-23 DIAGNOSIS — Z880 Allergy status to penicillin: Secondary | ICD-10-CM | POA: Diagnosis not present

## 2012-10-23 DIAGNOSIS — Z79899 Other long term (current) drug therapy: Secondary | ICD-10-CM | POA: Diagnosis not present

## 2012-10-23 DIAGNOSIS — M5137 Other intervertebral disc degeneration, lumbosacral region: Secondary | ICD-10-CM | POA: Diagnosis not present

## 2012-10-23 DIAGNOSIS — J811 Chronic pulmonary edema: Secondary | ICD-10-CM | POA: Diagnosis not present

## 2012-10-23 DIAGNOSIS — Z9181 History of falling: Secondary | ICD-10-CM | POA: Diagnosis not present

## 2012-10-23 DIAGNOSIS — R079 Chest pain, unspecified: Secondary | ICD-10-CM | POA: Diagnosis not present

## 2012-10-23 DIAGNOSIS — I10 Essential (primary) hypertension: Secondary | ICD-10-CM

## 2012-10-23 DIAGNOSIS — S72009S Fracture of unspecified part of neck of unspecified femur, sequela: Secondary | ICD-10-CM | POA: Diagnosis not present

## 2012-10-23 DIAGNOSIS — D72829 Elevated white blood cell count, unspecified: Secondary | ICD-10-CM | POA: Diagnosis not present

## 2012-10-23 DIAGNOSIS — R0902 Hypoxemia: Secondary | ICD-10-CM | POA: Diagnosis not present

## 2012-10-23 DIAGNOSIS — T1490XA Injury, unspecified, initial encounter: Secondary | ICD-10-CM | POA: Diagnosis not present

## 2012-10-23 DIAGNOSIS — Z950 Presence of cardiac pacemaker: Secondary | ICD-10-CM

## 2012-10-23 DIAGNOSIS — S72009D Fracture of unspecified part of neck of unspecified femur, subsequent encounter for closed fracture with routine healing: Secondary | ICD-10-CM | POA: Diagnosis not present

## 2012-10-23 DIAGNOSIS — N400 Enlarged prostate without lower urinary tract symptoms: Secondary | ICD-10-CM | POA: Diagnosis not present

## 2012-10-23 DIAGNOSIS — J189 Pneumonia, unspecified organism: Secondary | ICD-10-CM | POA: Diagnosis not present

## 2012-10-23 DIAGNOSIS — L8993 Pressure ulcer of unspecified site, stage 3: Secondary | ICD-10-CM | POA: Diagnosis not present

## 2012-10-23 DIAGNOSIS — R1312 Dysphagia, oropharyngeal phase: Secondary | ICD-10-CM | POA: Diagnosis not present

## 2012-10-23 DIAGNOSIS — L97409 Non-pressure chronic ulcer of unspecified heel and midfoot with unspecified severity: Secondary | ICD-10-CM | POA: Diagnosis not present

## 2012-10-23 DIAGNOSIS — M1710 Unilateral primary osteoarthritis, unspecified knee: Secondary | ICD-10-CM | POA: Diagnosis not present

## 2012-10-23 DIAGNOSIS — R488 Other symbolic dysfunctions: Secondary | ICD-10-CM | POA: Diagnosis not present

## 2012-10-23 DIAGNOSIS — S72009A Fracture of unspecified part of neck of unspecified femur, initial encounter for closed fracture: Secondary | ICD-10-CM

## 2012-10-23 DIAGNOSIS — R259 Unspecified abnormal involuntary movements: Secondary | ICD-10-CM | POA: Diagnosis not present

## 2012-10-23 DIAGNOSIS — R262 Difficulty in walking, not elsewhere classified: Secondary | ICD-10-CM | POA: Diagnosis not present

## 2012-10-23 DIAGNOSIS — L89609 Pressure ulcer of unspecified heel, unspecified stage: Secondary | ICD-10-CM | POA: Diagnosis not present

## 2012-10-23 DIAGNOSIS — M25569 Pain in unspecified knee: Secondary | ICD-10-CM | POA: Diagnosis not present

## 2012-10-23 DIAGNOSIS — Z7901 Long term (current) use of anticoagulants: Secondary | ICD-10-CM | POA: Diagnosis not present

## 2012-10-23 DIAGNOSIS — F0390 Unspecified dementia without behavioral disturbance: Secondary | ICD-10-CM | POA: Diagnosis not present

## 2012-10-23 DIAGNOSIS — N390 Urinary tract infection, site not specified: Secondary | ICD-10-CM | POA: Diagnosis not present

## 2012-10-23 DIAGNOSIS — S72002D Fracture of unspecified part of neck of left femur, subsequent encounter for closed fracture with routine healing: Secondary | ICD-10-CM

## 2012-10-23 DIAGNOSIS — I739 Peripheral vascular disease, unspecified: Secondary | ICD-10-CM | POA: Diagnosis not present

## 2012-10-23 DIAGNOSIS — S72143A Displaced intertrochanteric fracture of unspecified femur, initial encounter for closed fracture: Secondary | ICD-10-CM | POA: Diagnosis not present

## 2012-10-23 DIAGNOSIS — Z8669 Personal history of other diseases of the nervous system and sense organs: Secondary | ICD-10-CM | POA: Diagnosis not present

## 2012-10-23 DIAGNOSIS — Z8719 Personal history of other diseases of the digestive system: Secondary | ICD-10-CM | POA: Diagnosis not present

## 2012-10-23 DIAGNOSIS — M19019 Primary osteoarthritis, unspecified shoulder: Secondary | ICD-10-CM | POA: Diagnosis not present

## 2012-10-23 DIAGNOSIS — R05 Cough: Secondary | ICD-10-CM | POA: Diagnosis not present

## 2012-10-23 DIAGNOSIS — J449 Chronic obstructive pulmonary disease, unspecified: Secondary | ICD-10-CM | POA: Diagnosis not present

## 2012-10-23 DIAGNOSIS — S298XXA Other specified injuries of thorax, initial encounter: Secondary | ICD-10-CM | POA: Diagnosis not present

## 2012-10-23 DIAGNOSIS — Z5181 Encounter for therapeutic drug level monitoring: Secondary | ICD-10-CM | POA: Diagnosis not present

## 2012-10-23 DIAGNOSIS — Z862 Personal history of diseases of the blood and blood-forming organs and certain disorders involving the immune mechanism: Secondary | ICD-10-CM | POA: Diagnosis not present

## 2012-10-23 DIAGNOSIS — R609 Edema, unspecified: Secondary | ICD-10-CM | POA: Diagnosis not present

## 2012-10-23 DIAGNOSIS — M6281 Muscle weakness (generalized): Secondary | ICD-10-CM | POA: Diagnosis not present

## 2012-10-23 DIAGNOSIS — Z8701 Personal history of pneumonia (recurrent): Secondary | ICD-10-CM | POA: Diagnosis not present

## 2012-10-23 DIAGNOSIS — J690 Pneumonitis due to inhalation of food and vomit: Secondary | ICD-10-CM | POA: Diagnosis not present

## 2012-10-23 LAB — CBC
HCT: 35.2 % — ABNORMAL LOW (ref 39.0–52.0)
Hemoglobin: 11.9 g/dL — ABNORMAL LOW (ref 13.0–17.0)
RBC: 3.73 MIL/uL — ABNORMAL LOW (ref 4.22–5.81)
WBC: 11.4 10*3/uL — ABNORMAL HIGH (ref 4.0–10.5)

## 2012-10-23 LAB — BASIC METABOLIC PANEL
CO2: 29 mEq/L (ref 19–32)
Calcium: 8.8 mg/dL (ref 8.4–10.5)
Creatinine, Ser: 0.98 mg/dL (ref 0.50–1.35)
GFR calc Af Amer: 80 mL/min — ABNORMAL LOW (ref >90)
GFR calc non Af Amer: 69 mL/min — ABNORMAL LOW (ref >90)
Sodium: 135 mEq/L (ref 135–145)

## 2012-10-23 LAB — PROTIME-INR: Prothrombin Time: 14.4 seconds (ref 11.6–15.2)

## 2012-10-23 MED ORDER — DSS 100 MG PO CAPS: 100.0000 mg | ORAL_CAPSULE | Freq: Two times a day (BID) | ORAL | Status: DC | PRN

## 2012-10-23 MED ORDER — WARFARIN SODIUM 3 MG PO TABS
3.0000 mg | ORAL_TABLET | Freq: Once | ORAL | Status: DC
  Filled 2012-10-23: qty 1

## 2012-10-23 NOTE — Progress Notes (Signed)
Patient is set to discharge to [facility] today. Patient & niece, Duncan Dull aware. Discharge packet in [place]. PTAR scheduled for 2:00p pickup (Service Request Id: [id number]).   Clinical Social Work Department CLINICAL SOCIAL WORK PLACEMENT NOTE 10/23/2012  Patient:  CARMELLO, CABINESS  Account Number:  [id number] Admit date:  10/20/2012  Clinical Social Worker:  Orpah Greek  Date/time:  10/22/2012 04:15 PM  Clinical Social Work is seeking post-discharge placement for this patient at the following level of care:   SKILLED NURSING   (*CSW will update this form in Epic as items are completed)   10/22/2012  Patient/family provided with [facility] Department of Clinical Social Work's list of facilities offering this level of care within the geographic area requested by the patient (or if unable, by the patient's family).  10/22/2012  Patient/family informed of their freedom to choose among providers that offer the needed level of care, that participate in Medicare, Medicaid or managed care program needed by the patient, have an available bed and are willing to accept the patient.  10/22/2012  Patient/family informed of [facility]' ownership interest in [facility], as well as of the fact that they are under no obligation to receive care at this facility.  PASARR submitted to EDS on 10/22/2012 PASARR number received from EDS on 10/22/2012  FL2 transmitted to all facilities in geographic area requested by pt/family on  10/22/2012 FL2 transmitted to all facilities within larger geographic area on   Patient informed that his/her managed care company has contracts with or will negotiate with  certain facilities, including the following:     Patient/family informed of bed offers received:  10/22/2012 Patient chooses bed at [facility] & REHABILITATION Physician recommends and patient chooses bed at    Patient to be transferred to [facility] & REHABILITATION on   10/23/2012 Patient to be transferred to facility by PTAR  The following physician request were entered in Epic:   Additional Comments:   Unice Bailey, LCSW [facility] Clinical Social Worker cell #: [phone]

## 2012-10-23 NOTE — Assessment & Plan Note (Signed)
Continue  HCTZ- reported to be well controlled as in pt

## 2012-10-23 NOTE — Progress Notes (Signed)
ANTICOAGULATION CONSULT NOTE - Follow Up Consult  Pharmacy Consult for Coumadin Indication: VTE prophylaxis  Allergies  Allergen Reactions  . Penicillins Other (See Comments)    unknown   Labs:  Recent Labs  10/20/12 1840 10/21/12 0355 10/22/12 0349 10/23/12 0416  HGB 14.4 14.1 13.3 11.9*  HCT 42.2 40.8 39.5 35.2*  PLT 311 272 289 244  LABPROT 12.3  --  13.3 14.4  INR 0.93  --  1.03 1.14  CREATININE 0.90 0.84 1.09 0.98  TROPONINI <0.30  --   --   --     Assessment: 79 yo male sustained hip fracture after falling at home, s/p ORIF 10/6 and Coumadin started post-op for VTE prophylaxis. Coumadin score = 4 but starting Coumadin conservatively given age of 39.   INR 1.14, Hgb down to 11.9, plts wnl, no bleeding.   Coumadin 5mg  po daily already order at discharge  Goal of Therapy:  INR 2-3 Monitor platelets by anticoagulation protocol: Yes   Plan:   Coumadin 3 mg po x 1 tonight  Daily PT/INR  MD, given age of 59 - pharmacy will f/u with INR and recommend discharge dose. Currently Coumadin 5mg  po daily ordered at discharge.   Geoffry Paradise, PharmD, BCPS Pager: [phone] 9:23 AM Pharmacy #: [phone]

## 2012-10-23 NOTE — Assessment & Plan Note (Signed)
L intertrochanteric fx,s/p ORIF- warfarin 5 mg daily started-for one month then d/c for ASA 81 mg daily; INR 10/10;OT/PT

## 2012-10-23 NOTE — Assessment & Plan Note (Signed)
Continue proscar  ?

## 2012-10-23 NOTE — Progress Notes (Signed)
Patient being discharged to [facility]. Called report to [employer]. I also called niece [place]. Niece thanked Charity fundraiser and had no questions at this time. J.Alichia Alridge, RN
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
## 2012-10-23 NOTE — Progress Notes (Signed)
MRN: [id number] Name: Seth Hunter  Sex: male Age: 79 y.o. DOB: March 07, 1918  PSC #: Lonna Cobb Facility/Room: 222B Level Of Care: SNF Provider: Merrilee Seashore D Emergency Contacts: Extended Emergency Contact Information Primary Emergency Contact: Rita Ohara [place] Home Phone: [phone] Relation: Niece  Code Status: DNR  Allergies: Penicillins  Chief Complaint  Patient presents with  . nursing home admission    HPI: Patient is 79 y.o. male who is admitted after L hip fx for OT/PT.  Past Medical History  Diagnosis Date  . Syncope     Syncope in the setting of complete heart block status post permanent pacemaker placement on September 27, 2007  . Hypertension   . Dementia     thought this appears to be mild based on reports of family as of 8/11  . Elevated TSH     Mildly elevated TSH  . Atrioventricular block, complete   . Pacemaker -MDT   . COPD (chronic obstructive pulmonary disease) 04/1995    X-ray  . BPH (benign prostatic hyperplasia)   . Tremor   . Pneumonia 04/1995    Bilateral upper lobes  . Anemia 04/1995    Post op, 2nd to hemorrhage, left leg  . UTI (urinary tract infection) 05/1995  . DJD (degenerative joint disease), lumbar 04/1995    Spine, severe.   X-ray  . DJD (degenerative joint disease) 04/1995    Both shoulders, severe  . HOH (hard of hearing)   . Inguinal hernia     Past Surgical History  Procedure Laterality Date  . Pacemaker insertion    . Cholecystectomy  1988  . Hernia repair  Years ago    Left inguinal  . Ventral hernia repair  04/1995    with intra abdominal adhesions, lysis incidental appendectomy  . Pulmonary embolism surgery  04/1995    Post-op  . 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 Hematoma, left leg  05/1995    Probably 2nd to anticoag  . 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 Fractured left wrist  1990    with old right posterior rib fracture   . Syncope comp heart block  9/10- 09/30/2007    HOSP Perm Pacer placed, HTN, dementia, mildly elevated TSH  . Pacer placement (dr. [name])  09/27/2007  . Ct angio chest  09/27/07    No PE, No acute prob  . Femur im nail Left 10/21/2012    Procedure: INTRAMEDULLARY (IM) NAIL FEMORAL;  Surgeon: Cammy Copa, MD;  Location: [facility];  Service: Orthopedics;  Laterality: Left;      Medication List    Notice   This visit is during an admission. Changes to the med list made in this visit will be reflected in the After Visit Summary of the admission.    Medication list should come over on this template;meds were reviewed and confirmed per me.   No orders of the defined types were placed in this encounter.     There is no immunization history on file for this patient.  History  Substance Use Topics  . Smoking status: Never Smoker   . Smokeless tobacco: Not on file  . Alcohol Use: No    Family history is noncontributory    Review of Systems  DATA OBTAINED: from patient; NO C/O EXCEPT HIP PAIN;HE HAD JUST BEEN GIVEN A NORCO AND HE CAN HAVE ANOTHER GENERAL: Feels well no fevers, fatigue, appetite  changes SKIN: No itching, rash or wounds EYES: No eye pain, redness, discharge EARS: No earache, tinnitus, change in hearing NOSE: No congestion, drainage or bleeding  MOUTH/THROAT: No mouth or tooth pain, No sore throat, No difficulty chewing or swallowing  RESPIRATORY: No cough, wheezing, SOB CARDIAC: No chest pain, palpitations, lower extremity edema  GI: No abdominal pain, No N/V/D or constipation, No heartburn or reflux  GU: No dysuria, frequency or urgency, or incontinence  MUSCULOSKELETAL: No unrelieved bone/joint pain NEUROLOGIC: No headache, dizziness or focal weakness PSYCHIATRIC: No overt anxiety or sadness. Sleeps well. No behavior issue.   Filed Vitals:   10/23/12 1706  BP: 98/58  Pulse: 70  Temp: 97.1 F (36.2 C)  Resp: 18    Physical Exam  GENERAL APPEARANCE:  Alert, conversant. Appropriately groomed. No acute distress.  SKIN: No diaphoresis rash, or wounds HEAD: Normocephalic, atraumatic  EYES: Conjunctiva/lids clear. Pupils round, reactive. EOMs intact.  EARS: External exam WNL, canals clear. Hearing grossly normal.  NOSE: No deformity or discharge.  MOUTH/THROAT: Lips w/o lesions.  RESPIRATORY: Breathing is even, unlabored. Lung sounds are diffusely decreased   CARDIOVASCULAR distant, RRR no murmurs, rubs or gallops. No peripheral edema.  ARTERIAL: radial pulse 2+, DP pulse 1+  VENOUS: No varicosities.Mild venous stasis skin changes  GASTROINTESTINAL: Abdomen is soft, non-tender, not distended w/ normal bowel sounds. GENITOURINARY: Bladder non tender, not distended  MUSCULOSKELETAL: No abnormal joints or musculature NEUROLOGIC: Oriented X3. Cranial nerves 2-12 grossly intact. Moves all extremities no tremor. PSYCHIATRIC: Mood and affect appropriate to situation, no behavioral issues  Patient Active Problem List   Diagnosis Date Noted  . BPH (benign prostatic hyperplasia) 10/21/2012  . Leukocytosis, unspecified 10/21/2012  . Closed left hip fracture 10/20/2012  . Pacemaker -MDT   . ESSENTIAL HYPERTENSION 09/26/2007    CBC    Component Value Date/Time   WBC 11.4* 10/23/2012 0416   RBC 3.73* 10/23/2012 0416   HGB 11.9* 10/23/2012 0416   HCT 35.2* 10/23/2012 0416   PLT 244 10/23/2012 0416   MCV 94.4 10/23/2012 0416   LYMPHSABS 2.4 10/20/2012 1840   MONOABS 1.5* 10/20/2012 1840   EOSABS 0.2 10/20/2012 1840   BASOSABS 0.0 10/20/2012 1840    CMP     Component Value Date/Time   NA 135 10/23/2012 0416   K 4.0 10/23/2012 0416   CL 98 10/23/2012 0416   CO2 29 10/23/2012 0416   GLUCOSE 114* 10/23/2012 0416   BUN 23 10/23/2012 0416   CREATININE 0.98 10/23/2012 0416   CALCIUM 8.8 10/23/2012 0416   PROT 6.6 10/21/2012 0355   ALBUMIN 3.1* 10/21/2012 0355   AST 33 10/21/2012 0355   ALT 14 10/21/2012 0355   ALKPHOS 81 10/21/2012 0355   BILITOT 0.9  10/21/2012 0355   GFRNONAA 69* 10/23/2012 0416   GFRAA 80* 10/23/2012 0416    Assessment and Plan  Closed left hip fracture L intertrochanteric fx,s/p ORIF- warfarin 5 mg daily started-for one month then d/c for ASA 81 mg daily; INR 10/10;OT/PT  ESSENTIAL HYPERTENSION Continue  HCTZ- reported to be well controlled as in pt  BPH (benign prostatic hyperplasia) Continue proscar  Pacemaker -MDT Stable in hospital    Margit Hanks, MD

## 2012-10-23 NOTE — Assessment & Plan Note (Signed)
Stable in hospital 

## 2012-10-25 ENCOUNTER — Telehealth: Payer: Self-pay | Admitting: Family Medicine

## 2012-10-25 NOTE — Telephone Encounter (Signed)
Spoke with pt's niece, Duncan Dull, who said that pt fell on Sunday and broke his femur.  He had surgery and is now at [facility] x 20 days.  She said that the orthopedic surgeon kept pt on Coumadin x 3 weeks.  I asked her if they were checking pt's INR at nursing facility but she said she didn't know and would speak with the charge nurse this week to make sure they were drawing INR at least once a week and dosing accordingly.  She will make f/u appt with Dr. Para March once pt is back home.

## 2012-10-25 NOTE — Telephone Encounter (Signed)
Noted, thanks!

## 2012-10-28 ENCOUNTER — Non-Acute Institutional Stay (SKILLED_NURSING_FACILITY): Payer: Medicare Other | Admitting: Internal Medicine

## 2012-10-28 ENCOUNTER — Encounter: Payer: Self-pay | Admitting: Internal Medicine

## 2012-10-28 DIAGNOSIS — IMO0002 Reserved for concepts with insufficient information to code with codable children: Secondary | ICD-10-CM

## 2012-10-28 DIAGNOSIS — J690 Pneumonitis due to inhalation of food and vomit: Secondary | ICD-10-CM

## 2012-10-28 DIAGNOSIS — Z5181 Encounter for therapeutic drug level monitoring: Secondary | ICD-10-CM | POA: Insufficient documentation

## 2012-10-28 DIAGNOSIS — Z7901 Long term (current) use of anticoagulants: Secondary | ICD-10-CM

## 2012-10-28 NOTE — Assessment & Plan Note (Addendum)
Pt was put on levaquin for LUL PNA on 10/10, first dose was given 10/11;INR today 6.7 up from 1.5; pt's coumadin will be held for 3 days, vitamin K 5mg  SQ given, INR tomorrow and levaquin D/C;pt had not gotten his levaquoin today. Zyvox 600 mg q 12 for 8 days will be started; bedrest and no therapy until INR at target

## 2012-10-28 NOTE — Progress Notes (Signed)
MRN: [id number] Name: Seth Hunter  Sex: male Age: 79 y.o. DOB: 1918/08/23  PSC #: Sonny Dandy Facility/Room:  222B Level Of Care: SNF Provider: Merrilee Seashore D Emergency Contacts: Extended Emergency Contact Information Primary Emergency Contact: Rita Ohara [place] of [place] Home Phone: [phone] Relation: Niece  Code Status: DNR  Allergies: Penicillins  Chief Complaint  Patient presents with  . Acute Visit    HPI: Patient is 79 y.o. male who was dx with PNA, put on levaquin and now his INR is 6.7. Past Medical History  Diagnosis Date  . Syncope     Syncope in the setting of complete heart block status post permanent pacemaker placement on September 27, 2007  . Hypertension   . Dementia     thought this appears to be mild based on reports of family as of 8/11  . Elevated TSH     Mildly elevated TSH  . Atrioventricular block, complete   . Pacemaker -MDT   . COPD (chronic obstructive pulmonary disease) 04/1995    X-ray  . BPH (benign prostatic hyperplasia)   . Tremor   . Pneumonia 04/1995    Bilateral upper lobes  . Anemia 04/1995    Post op, 2nd to hemorrhage, left leg  . UTI (urinary tract infection) 05/1995  . DJD (degenerative joint disease), lumbar 04/1995    Spine, severe.   X-ray  . DJD (degenerative joint disease) 04/1995    Both shoulders, severe  . HOH (hard of hearing)   . Inguinal hernia     Past Surgical History  Procedure Laterality Date  . Pacemaker insertion    . Cholecystectomy  1988  . Hernia repair  Years ago    Left inguinal  . Ventral hernia repair  04/1995    with intra abdominal adhesions, lysis incidental appendectomy  . Pulmonary embolism surgery  04/1995    Post-op  . 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 Hematoma, left leg  05/1995    Probably 2nd to anticoag  . 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 Fractured left wrist  1990    with old right posterior rib  fracture  . Syncope comp heart block  9/10- 09/30/2007    HOSP Perm Pacer placed, HTN, dementia, mildly elevated TSH  . Pacer placement (dr. [name])  09/27/2007  . Ct angio chest  09/27/07    No PE, No acute prob  . Femur im nail Left 10/21/2012    Procedure: INTRAMEDULLARY (IM) NAIL FEMORAL;  Surgeon: Cammy Copa, MD;  Location: [facility];  Service: Orthopedics;  Laterality: Left;      Medication List       This list is accurate as of: 10/28/12 10:13 AM.  Always use your most recent med list.               DSS 100 MG Caps  Take 100 mg by mouth 2 (two) times daily as needed for constipation.     finasteride 5 MG tablet  Commonly known as:  PROSCAR  Take 1 tablet (5 mg total) by mouth daily.     hydrochlorothiazide 12.5 MG capsule  Commonly known as:  MICROZIDE  Take 12.5 mg by mouth every morning.     HYDROcodone-acetaminophen 5-325 MG per tablet  Commonly known as:  NORCO/VICODIN  Take 1-2 tablets by mouth every 6 (six) hours as needed.     HYDROcodone-acetaminophen 5-325 MG per tablet  Commonly known as:  NORCO  Take 1-2 tablets by mouth every 6 (six) hours as needed for pain.     levofloxacin 500 MG tablet  Commonly known as:  LEVAQUIN  Take 500 mg by mouth daily.     warfarin 6 MG tablet  Commonly known as:  COUMADIN  Take 6 mg by mouth daily.     warfarin 2 MG tablet  Commonly known as:  COUMADIN  Take 2 mg by mouth daily.        Meds ordered this encounter  Medications  . warfarin (COUMADIN) 6 MG tablet    Sig: Take 6 mg by mouth daily.  Marland Kitchen warfarin (COUMADIN) 2 MG tablet    Sig: Take 2 mg by mouth daily.  Marland Kitchen levofloxacin (LEVAQUIN) 500 MG tablet    Sig: Take 500 mg by mouth daily.     There is no immunization history on file for this patient.  History  Substance Use Topics  . Smoking status: Never Smoker   . Smokeless tobacco: Not on file  . Alcohol Use: No    Review of Systems  DATA OBTAINED: from patient; PT HAS NO C/O; SAYS NO TO  EVERY QUESTION GENERAL: Feels well no fevers, fatigue, appetite changes SKIN: No itching, rash HEENT: No complaint RESPIRATORY: No cough, wheezing, SOB CARDIAC: No chest pain, palpitations, lower extremity edema  GI: No abdominal pain, No N/V/D or constipation, No heartburn or reflux  GU: No dysuria, frequency or urgency, or incontinence  MUSCULOSKELETAL: No unrelieved bone/joint pain NEUROLOGIC: No headache, dizziness or focal weakness PSYCHIATRIC: No overt anxiety or sadness. Sleeps well.   Filed Vitals:   10/28/12 0956  BP: 126/68  Pulse: 70  Temp: 97.6 F (36.4 C)  Resp: 19    Physical Exam  GENERAL APPEARANCE: Alert, conversant. Appropriately groomed. No acute distress  SKIN: No diaphoresis rash HEENT: Unremarkable RESPIRATORY: Breathing is even, unlabored. Lung sounds are clear   CARDIOVASCULAR: Heart RRR no murmurs, rubs or gallops. No peripheral edema  GASTROINTESTINAL: Abdomen is soft, non-tender, not distended w/ normal bowel sounds.  GENITOURINARY: Bladder non tender, not distended  MUSCULOSKELETAL: No abnormal joints or musculature NEUROLOGIC: Cranial nerves 2-12 grossly intact. Moves all extremities no tremor. PSYCHIATRIC:PT has dementia demeanor; no behavioral issues  Patient Active Problem List   Diagnosis Date Noted  . Anticoagulation goal of INR 2 to 3 10/28/2012  . BPH (benign prostatic hyperplasia) 10/21/2012  . Leukocytosis, unspecified 10/21/2012  . Closed left hip fracture 10/20/2012  . Pacemaker -MDT   . ESSENTIAL HYPERTENSION 09/26/2007    CBC    Component Value Date/Time   WBC 11.4* 10/23/2012 0416   RBC 3.73* 10/23/2012 0416   HGB 11.9* 10/23/2012 0416   HCT 35.2* 10/23/2012 0416   PLT 244 10/23/2012 0416   MCV 94.4 10/23/2012 0416   LYMPHSABS 2.4 10/20/2012 1840   MONOABS 1.5* 10/20/2012 1840   EOSABS 0.2 10/20/2012 1840   BASOSABS 0.0 10/20/2012 1840    CMP     Component Value Date/Time   NA 135 10/23/2012 0416   K 4.0 10/23/2012 0416    CL 98 10/23/2012 0416   CO2 29 10/23/2012 0416   GLUCOSE 114* 10/23/2012 0416   BUN 23 10/23/2012 0416   CREATININE 0.98 10/23/2012 0416   CALCIUM 8.8 10/23/2012 0416   PROT 6.6 10/21/2012 0355   ALBUMIN 3.1* 10/21/2012 0355   AST 33 10/21/2012 0355   ALT 14 10/21/2012 0355   ALKPHOS 81 10/21/2012 0355   BILITOT  0.9 10/21/2012 0355   GFRNONAA 69* 10/23/2012 0416   GFRAA 80* 10/23/2012 0416    Assessment and Plan  HYPERANTICOAGULATED-  Anticoagulation goal of INR 2 to 3 Pt was put on levaquin for LUL PNA on 10/10, first dose was given 10/11;INR today 6.7 up from 1.5; pt's coumadin will be held for 3 days, vitamin K 5mg  SQ given, INR tomorrow and levaquin D/C;pt had not gotten his levaquoin today. Zyvox 600 mg q 12 for 8 days will be started; bedrest and no therapy until INR at target    PNA - pt has LUL PNA by CXR 10/10 -prob aspiration; pt was put on Levaquin and INR shot up to 6.7; levaquin is D/C, pt got total 2 doses total of levaquin and zyvox 600 mg q 12 will be started    Margit Hanks, MD

## 2012-11-14 ENCOUNTER — Non-Acute Institutional Stay (SKILLED_NURSING_FACILITY): Payer: Medicare Other | Admitting: Nurse Practitioner

## 2012-11-14 ENCOUNTER — Encounter: Payer: Self-pay | Admitting: Nurse Practitioner

## 2012-11-14 DIAGNOSIS — M1710 Unilateral primary osteoarthritis, unspecified knee: Secondary | ICD-10-CM | POA: Diagnosis not present

## 2012-11-14 DIAGNOSIS — Z5181 Encounter for therapeutic drug level monitoring: Secondary | ICD-10-CM

## 2012-11-14 DIAGNOSIS — S72009S Fracture of unspecified part of neck of unspecified femur, sequela: Secondary | ICD-10-CM | POA: Diagnosis not present

## 2012-11-14 DIAGNOSIS — I739 Peripheral vascular disease, unspecified: Secondary | ICD-10-CM | POA: Diagnosis not present

## 2012-11-14 DIAGNOSIS — Z7901 Long term (current) use of anticoagulants: Secondary | ICD-10-CM | POA: Diagnosis not present

## 2012-11-14 DIAGNOSIS — S72002S Fracture of unspecified part of neck of left femur, sequela: Secondary | ICD-10-CM

## 2012-11-14 DIAGNOSIS — S72143A Displaced intertrochanteric fracture of unspecified femur, initial encounter for closed fracture: Secondary | ICD-10-CM | POA: Diagnosis not present

## 2012-11-14 NOTE — Progress Notes (Signed)
Patient ID: Seth Hunter, male   DOB: 01/24/1918, 79 y.o.   MRN: [id number] Nursing Home Location:  [facility]   Place of Service: SNF (31)  PCP: Crawford Givens, MD   Allergies  Allergen Reactions  . Penicillins Other (See Comments)    unknown    Chief Complaint  Patient presents with  . Acute Visit    coumadin managment    HPI:  79 year old male who is being seen today for coumadin management; pt is at [facility] s/p closed left hip fracture and is to be on coumadin for 1 month then transferred to [facility]; Todays INR was 4.6 Pt currently on 4 mg MWF and 5 mg every other day.  Pt with no signs or symptoms of blood loss or bleeding; pt taking coumadin as prescribed Nursing without concerns Review of Systems:  Review of Systems  Constitutional: Negative for fever and chills.  HENT: Negative for nosebleeds.   Respiratory: Negative for shortness of breath.   Cardiovascular: Negative for chest pain.  Gastrointestinal: Negative for constipation and blood in stool.  Genitourinary: Negative for dysuria and hematuria.  Skin: Negative.   Neurological: Negative for weakness.  Endo/Heme/Allergies: Does not bruise/bleed easily.      Past Medical History  Diagnosis Date  . Syncope     Syncope in the setting of complete heart block status post permanent pacemaker placement on September 27, 2007  . Hypertension   . Dementia     thought this appears to be mild based on reports of family as of 8/11  . Elevated TSH     Mildly elevated TSH  . Atrioventricular block, complete   . Pacemaker -MDT   . COPD (chronic obstructive pulmonary disease) 04/1995    X-ray  . BPH (benign prostatic hyperplasia)   . Tremor   . Pneumonia 04/1995    Bilateral upper lobes  . Anemia 04/1995    Post op, 2nd to hemorrhage, left leg  . UTI (urinary tract infection) 05/1995  . DJD (degenerative joint disease), lumbar 04/1995    Spine, severe.   X-ray  . DJD (degenerative joint disease)  04/1995    Both shoulders, severe  . HOH (hard of hearing)   . Inguinal hernia    Past Surgical History  Procedure Laterality Date  . Pacemaker insertion    . Cholecystectomy  1988  . Hernia repair  Years ago    Left inguinal  . Ventral hernia repair  04/1995    with intra abdominal adhesions, lysis incidental appendectomy  . Pulmonary embolism surgery  04/1995    Post-op  . 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 Hematoma, left leg  05/1995    Probably 2nd to anticoag  . 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 Fractured left wrist  1990    with old right posterior rib fracture  . Syncope comp heart block  9/10- 09/30/2007    HOSP Perm Pacer placed, HTN, dementia, mildly elevated TSH  . Pacer placement (dr. [name])  09/27/2007  . Ct angio chest  09/27/07    No PE, No acute prob  . Femur im nail Left 10/21/2012    Procedure: INTRAMEDULLARY (IM) NAIL FEMORAL;  Surgeon: Cammy Copa, MD;  Location: [facility];  Service: Orthopedics;  Laterality: Left;   Social History:   reports that he has never smoked. He does not have any smokeless tobacco history on  file. He reports that he does not drink alcohol or use illicit drugs.  Family History  Problem Relation Age of Onset  . Heart disease Father     Hardening of the arteries, heart trouble    Medications: Patient's Medications  New Prescriptions   No medications on file  Previous Medications   DOCUSATE SODIUM 100 MG CAPS    Take 100 mg by mouth 2 (two) times daily as needed for constipation.   FINASTERIDE (PROSCAR) 5 MG TABLET    Take 1 tablet (5 mg total) by mouth daily.   HYDROCHLOROTHIAZIDE (MICROZIDE) 12.5 MG CAPSULE    Take 12.5 mg by mouth every morning.   HYDROCODONE-ACETAMINOPHEN (NORCO) 5-325 MG PER TABLET    Take 1-2 tablets by mouth every 6 (six) hours as needed for pain.   HYDROCODONE-ACETAMINOPHEN (NORCO/VICODIN) 5-325 MG PER TABLET    Take 1-2 tablets by mouth  every 6 (six) hours as needed.   WARFARIN (COUMADIN) 4 MG TABLET    Take 4-5 mg by mouth daily. Takes 4 mg MWF and takes 5 mg tues, thurs, sat, sun  Modified Medications   No medications on file  Discontinued Medications   LEVOFLOXACIN (LEVAQUIN) 500 MG TABLET    Take 500 mg by mouth daily.   WARFARIN (COUMADIN) 2 MG TABLET    Take 2 mg by mouth daily.   WARFARIN (COUMADIN) 6 MG TABLET    Take 6 mg by mouth daily.     Physical Exam:  Filed Vitals:   11/14/12 1142  BP: 122/52  Pulse: 100  Temp: 98.1 F (36.7 C)  Resp: 20   Physical Exam  Constitutional: He is well-developed, well-nourished, and in no distress. No distress.  Cardiovascular: Normal rate, regular rhythm and normal heart sounds.   Pulmonary/Chest: Effort normal and breath sounds normal. No respiratory distress.  Abdominal: Soft. Bowel sounds are normal. He exhibits no distension.  Neurological: He is alert.  Skin: Skin is warm and dry. He is not diaphoretic.      Labs reviewed: Basic Metabolic Panel:  Recent Labs  16/10/96 1840 10/21/12 0355 10/22/12 0349 10/23/12 0416  NA 137 136 136 135  K 4.0 4.6 4.4 4.0  CL 100 101 99 98  CO2 27 25 26 29   GLUCOSE 116* 115* 114* 114*  BUN 21 19 21 23   CREATININE 0.90 0.84 1.09 0.98  CALCIUM 9.8 9.2 9.1 8.8  MG  --  1.9  --   --   PHOS  --  3.5  --   --    Liver Function Tests:  Recent Labs  10/21/12 0355  AST 33  ALT 14  ALKPHOS 81  BILITOT 0.9  PROT 6.6  ALBUMIN 3.1*   No results found for this basename: LIPASE, AMYLASE,  in the last 8760 hours No results found for this basename: AMMONIA,  in the last 8760 hours CBC:  Recent Labs  10/20/12 1840 10/21/12 0355 10/22/12 0349 10/23/12 0416  WBC 18.7* 12.2* 18.1* 11.4*  NEUTROABS 14.6*  --   --   --   HGB 14.4 14.1 13.3 11.9*  HCT 42.2 40.8 39.5 35.2*  MCV 93.6 93.6 94.7 94.4  PLT 311 272 289 244   Cardiac Enzymes:  Recent Labs  10/20/12 1840  TROPONINI <0.30   BNP: No components  found with this basename: POCBNP,  CBG: No results found for this basename: GLUCAP,  in the last 8760 hours TSH: No results found for this basename: TSH,  in the last  8760 hours A1C: Lab Results  Component Value Date   HGBA1C  Value: 5.7 (NOTE)   The ADA recommends the following therapeutic goal for glycemic   control related to Hgb A1C measurement:   Goal of Therapy:   < 7.0% Hgb A1C   Reference: American Diabetes Association: Clinical Practice   Recommendations 2008, Diabetes Care,  2008, 31:(Suppl 1). 09/27/2007     Assessment/Plan 1. Anticoagulation goal of INR 2 to 3 -will hold coumadin for the next 2 days and recheck on 11/1  2 Closed left hip fracture L intertrochanteric fx,s/p ORIF- warfarin daily started-for one month then d/c for [facility] 81 mg daily

## 2012-11-19 ENCOUNTER — Non-Acute Institutional Stay (SKILLED_NURSING_FACILITY): Payer: Medicare Other | Admitting: Nurse Practitioner

## 2012-11-19 ENCOUNTER — Encounter: Payer: Self-pay | Admitting: Nurse Practitioner

## 2012-11-19 DIAGNOSIS — Z7901 Long term (current) use of anticoagulants: Secondary | ICD-10-CM

## 2012-11-19 DIAGNOSIS — S72002S Fracture of unspecified part of neck of left femur, sequela: Secondary | ICD-10-CM

## 2012-11-19 DIAGNOSIS — S72009S Fracture of unspecified part of neck of unspecified femur, sequela: Secondary | ICD-10-CM | POA: Diagnosis not present

## 2012-11-19 DIAGNOSIS — I10 Essential (primary) hypertension: Secondary | ICD-10-CM | POA: Diagnosis not present

## 2012-11-19 DIAGNOSIS — Z5181 Encounter for therapeutic drug level monitoring: Secondary | ICD-10-CM

## 2012-11-19 DIAGNOSIS — N400 Enlarged prostate without lower urinary tract symptoms: Secondary | ICD-10-CM | POA: Diagnosis not present

## 2012-11-19 NOTE — Progress Notes (Signed)
Patient ID: Seth Hunter, male   DOB: May 03, 1918, 79 y.o.   MRN: [id number] Nursing Home Location:  [facility]   Place of Service: SNF (31)  PCP: Crawford Givens, MD   Allergies  Allergen Reactions  . Penicillins Other (See Comments)    unknown    Chief Complaint  Patient presents with  . Medical Managment of Chronic Issues    HPI:  Seth Hunter is a 79 y.o. male with a past medical history of syncope, hypertension, dementia, elevated TSH, AV block, packmaker, COPD, BPH, Tremor; who is being seen today for routine follow up; pt is at [facility] after a mechanical fall due to tripping now which led to a Intertrochanteric left femoral fracture now status post ORIF. Pt currently on coumadin at 3 mg. Pt without any complaints today and staff without concerns.  Review of Systems:  Review of Systems  Constitutional: Negative for fever and chills.  HENT: Negative for nosebleeds.   Respiratory: Negative for shortness of breath.   Cardiovascular: Negative for chest pain.  Gastrointestinal: Negative for constipation and blood in stool.  Genitourinary: Negative for dysuria and hematuria.  Skin: Negative.        Wound on left heel  Neurological: Negative for weakness.  Endo/Heme/Allergies: Does not bruise/bleed easily.     Past Medical History  Diagnosis Date  . Syncope     Syncope in the setting of complete heart block status post permanent pacemaker placement on September 27, 2007  . Hypertension   . Dementia     thought this appears to be mild based on reports of family as of 8/11  . Elevated TSH     Mildly elevated TSH  . Atrioventricular block, complete   . Pacemaker -MDT   . COPD (chronic obstructive pulmonary disease) 04/1995    X-ray  . BPH (benign prostatic hyperplasia)   . Tremor   . Pneumonia 04/1995    Bilateral upper lobes  . Anemia 04/1995    Post op, 2nd to hemorrhage, left leg  . UTI (urinary tract infection) 05/1995  . DJD (degenerative joint  disease), lumbar 04/1995    Spine, severe.   X-ray  . DJD (degenerative joint disease) 04/1995    Both shoulders, severe  . HOH (hard of hearing)   . Inguinal hernia    Past Surgical History  Procedure Laterality Date  . Pacemaker insertion    . Cholecystectomy  1988  . Hernia repair  Years ago    Left inguinal  . Ventral hernia repair  04/1995    with intra abdominal adhesions, lysis incidental appendectomy  . Pulmonary embolism surgery  04/1995    Post-op  . 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 Hematoma, left leg  05/1995    Probably 2nd to anticoag  . 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 Fractured left wrist  1990    with old right posterior rib fracture  . Syncope comp heart block  9/10- 09/30/2007    HOSP Perm Pacer placed, HTN, dementia, mildly elevated TSH  . Pacer placement (dr. [name])  09/27/2007  . Ct angio chest  09/27/07    No PE, No acute prob  . Femur im nail Left 10/21/2012    Procedure: INTRAMEDULLARY (IM) NAIL FEMORAL;  Surgeon: Cammy Copa, MD;  Location: [facility];  Service: Orthopedics;  Laterality: Left;   Social History:   reports that he  has never smoked. He does not have any smokeless tobacco history on file. He reports that he does not drink alcohol or use illicit drugs.  Family History  Problem Relation Age of Onset  . Heart disease Father     Hardening of the arteries, heart trouble    Medications: Patient's Medications  New Prescriptions   No medications on file  Previous Medications   DOCUSATE SODIUM 100 MG CAPS    Take 100 mg by mouth 2 (two) times daily as needed for constipation.   FINASTERIDE (PROSCAR) 5 MG TABLET    Take 1 tablet (5 mg total) by mouth daily.   HYDROCHLOROTHIAZIDE (MICROZIDE) 12.5 MG CAPSULE    Take 12.5 mg by mouth every morning.   HYDROCODONE-ACETAMINOPHEN (NORCO) 5-325 MG PER TABLET    Take 1-2 tablets by mouth every 6 (six) hours as needed for pain.    HYDROCODONE-ACETAMINOPHEN (NORCO/VICODIN) 5-325 MG PER TABLET    Take 1-2 tablets by mouth every 6 (six) hours as needed.   WARFARIN (COUMADIN) 3 MG TABLET    Take 3 mg by mouth daily.  Modified Medications   No medications on file  Discontinued Medications   WARFARIN (COUMADIN) 4 MG TABLET    Take 4-5 mg by mouth daily. Takes 4 mg MWF and takes 5 mg tues, thurs, sat, sun     Physical Exam: Physical Exam  Constitutional: He is well-developed, well-nourished, and in no distress. No distress.  Neck: Normal range of motion. Neck supple.  Cardiovascular: Normal rate, regular rhythm and normal heart sounds.   Pulmonary/Chest: Effort normal and breath sounds normal. No respiratory distress.  Abdominal: Soft. Bowel sounds are normal. He exhibits no distension.  Musculoskeletal:  Generalized weakness   Neurological: He is alert.  Skin: Skin is warm and dry. He is not diaphoretic.  unstageable wound on left heel being followed by wound care  Psychiatric: Affect normal. He exhibits disordered thought content and abnormal recent memory.    Filed Vitals:   11/19/12 1650  BP: 116/67  Pulse: 75  Temp: 98.8 F (37.1 C)  Resp: 20      Labs reviewed: Basic Metabolic Panel:  Recent Labs  16/10/96 1840 10/21/12 0355 10/22/12 0349 10/23/12 0416  NA 137 136 136 135  K 4.0 4.6 4.4 4.0  CL 100 101 99 98  CO2 27 25 26 29   GLUCOSE 116* 115* 114* 114*  BUN 21 19 21 23   CREATININE 0.90 0.84 1.09 0.98  CALCIUM 9.8 9.2 9.1 8.8  MG  --  1.9  --   --   PHOS  --  3.5  --   --    Liver Function Tests:  Recent Labs  10/21/12 0355  AST 33  ALT 14  ALKPHOS 81  BILITOT 0.9  PROT 6.6  ALBUMIN 3.1*   No results found for this basename: LIPASE, AMYLASE,  in the last 8760 hours No results found for this basename: AMMONIA,  in the last 8760 hours CBC:  Recent Labs  10/20/12 1840 10/21/12 0355 10/22/12 0349 10/23/12 0416  WBC 18.7* 12.2* 18.1* 11.4*  NEUTROABS 14.6*  --   --   --    HGB 14.4 14.1 13.3 11.9*  HCT 42.2 40.8 39.5 35.2*  MCV 93.6 93.6 94.7 94.4  PLT 311 272 289 244    Assessment/Plan 1. ESSENTIAL HYPERTENSION Patient is stable; continue current regimen. Will monitor and make changes as necessary.  2. Closed left hip fracture, sequela S/p ORIF; incision well heeled;  pain has been controlled only needing minimal PRN medication  3. BPH (benign prostatic hyperplasia) Stable on current medications  4. Anticoagulation goal of INR 2 to 3 Currently on 3 mg; INR will be rechecked on 11/10  Labs/tests ordered CBC and BMP

## 2012-11-20 DIAGNOSIS — L89609 Pressure ulcer of unspecified heel, unspecified stage: Secondary | ICD-10-CM | POA: Diagnosis not present

## 2012-11-20 DIAGNOSIS — L8995 Pressure ulcer of unspecified site, unstageable: Secondary | ICD-10-CM | POA: Diagnosis not present

## 2012-11-21 ENCOUNTER — Other Ambulatory Visit: Payer: Self-pay

## 2012-11-26 ENCOUNTER — Ambulatory Visit: Payer: Medicare Other | Admitting: Family Medicine

## 2012-12-04 DIAGNOSIS — L89609 Pressure ulcer of unspecified heel, unspecified stage: Secondary | ICD-10-CM | POA: Diagnosis not present

## 2012-12-04 DIAGNOSIS — L8995 Pressure ulcer of unspecified site, unstageable: Secondary | ICD-10-CM | POA: Diagnosis not present

## 2012-12-11 DIAGNOSIS — L89609 Pressure ulcer of unspecified heel, unspecified stage: Secondary | ICD-10-CM | POA: Diagnosis not present

## 2012-12-11 DIAGNOSIS — L8995 Pressure ulcer of unspecified site, unstageable: Secondary | ICD-10-CM | POA: Diagnosis not present

## 2012-12-17 ENCOUNTER — Non-Acute Institutional Stay (SKILLED_NURSING_FACILITY): Payer: Medicare Other | Admitting: Nurse Practitioner

## 2012-12-17 DIAGNOSIS — I10 Essential (primary) hypertension: Secondary | ICD-10-CM | POA: Diagnosis not present

## 2012-12-17 DIAGNOSIS — S72009S Fracture of unspecified part of neck of unspecified femur, sequela: Secondary | ICD-10-CM | POA: Diagnosis not present

## 2012-12-17 DIAGNOSIS — S72002S Fracture of unspecified part of neck of left femur, sequela: Secondary | ICD-10-CM

## 2012-12-17 DIAGNOSIS — N400 Enlarged prostate without lower urinary tract symptoms: Secondary | ICD-10-CM

## 2012-12-17 DIAGNOSIS — F0390 Unspecified dementia without behavioral disturbance: Secondary | ICD-10-CM

## 2012-12-17 NOTE — Progress Notes (Signed)
Patient ID: Seth Hunter, male   DOB: September 23, 1918, 78 y.o.   MRN: [id number]    Nursing Home Location:  [facility]   Place of Service: SNF (31)  PCP: Crawford Givens, MD  Allergies  Allergen Reactions  . Penicillins Other (See Comments)    unknown    Chief Complaint  Patient presents with  . Medical Managment of Chronic Issues    HPI:  Seth Hunter is a 79 y.o. male with a past medical history of syncope, hypertension, dementia, elevated TSH, AV block, packmaker, COPD, BPH, Tremor; who is being seen today for routine follow up; pt is at [facility] after a mechanical fall due to tripping now which led to a Intertrochanteric left femoral fracture now status post ORIF.   There has been no issues in the last month; coumadin has been d/c due to completion of course. Pt without any complaints today and staff without concerns. Review of Systems:  Review of Systems  Constitutional: Negative for fever and chills.  HEENT: Negative for nosebleeds.  Respiratory: Negative for shortness of breath, cough Cardiovascular: Negative for chest pain or palpitations Gastrointestinal: Negative for constipation and blood in stool.  Genitourinary: Negative for dysuria and hematuria.  Skin: Wound on left heel followed by wound care Neurological: Negative for weakness.  Endo/Heme/Allergies: Does not bruise/bleed easily.    Past Medical History  Diagnosis Date  . Syncope     Syncope in the setting of complete heart block status post permanent pacemaker placement on September 27, 2007  . Hypertension   . Dementia     thought this appears to be mild based on reports of family as of 8/11  . Elevated TSH     Mildly elevated TSH  . Atrioventricular block, complete   . Pacemaker -MDT   . COPD (chronic obstructive pulmonary disease) 04/1995    X-ray  . BPH (benign prostatic hyperplasia)   . Tremor   . Pneumonia 04/1995    Bilateral upper lobes  . Anemia 04/1995    Post op, 2nd to  hemorrhage, left leg  . UTI (urinary tract infection) 05/1995  . DJD (degenerative joint disease), lumbar 04/1995    Spine, severe.   X-ray  . DJD (degenerative joint disease) 04/1995    Both shoulders, severe  . HOH (hard of hearing)   . Inguinal hernia    Past Surgical History  Procedure Laterality Date  . Pacemaker insertion    . Cholecystectomy  1988  . Hernia repair  Years ago    Left inguinal  . Ventral hernia repair  04/1995    with intra abdominal adhesions, lysis incidental appendectomy  . Pulmonary embolism surgery  04/1995    Post-op  . 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 Hematoma, left leg  05/1995    Probably 2nd to anticoag  . 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 Fractured left wrist  1990    with old right posterior rib fracture  . Syncope comp heart block  9/10- 09/30/2007    HOSP Perm Pacer placed, HTN, dementia, mildly elevated TSH  . Pacer placement (dr. [name])  09/27/2007  . Ct angio chest  09/27/07    No PE, No acute prob  . Femur im nail Left 10/21/2012    Procedure: INTRAMEDULLARY (IM) NAIL FEMORAL;  Surgeon: Cammy Copa, MD;  Location: [facility];  Service: Orthopedics;  Laterality: Left;  Social History:   reports that he has never smoked. He does not have any smokeless tobacco history on file. He reports that he does not drink alcohol or use illicit drugs.  Family History  Problem Relation Age of Onset  . Heart disease Father     Hardening of the arteries, heart trouble    Medications: Patient's Medications  New Prescriptions   No medications on file  Previous Medications   DOCUSATE SODIUM 100 MG CAPS    Take 100 mg by mouth 2 (two) times daily as needed for constipation.   FINASTERIDE (PROSCAR) 5 MG TABLET    Take 1 tablet (5 mg total) by mouth daily.   HYDROCHLOROTHIAZIDE (MICROZIDE) 12.5 MG CAPSULE    Take 12.5 mg by mouth every morning.   HYDROCODONE-ACETAMINOPHEN (NORCO) 5-325  MG PER TABLET    Take 1-2 tablets by mouth every 6 (six) hours as needed for pain.   HYDROCODONE-ACETAMINOPHEN (NORCO/VICODIN) 5-325 MG PER TABLET    Take 1-2 tablets by mouth every 6 (six) hours as needed.  Modified Medications   No medications on file  Discontinued Medications   WARFARIN (COUMADIN) 3 MG TABLET    Take 3 mg by mouth daily.     Physical Exam: Physical Exam  Constitutional: He is well-developed, well-nourished, and in no distress. No distress.  HEENT: unremarkable Neck: Normal range of motion. Neck supple.  Cardiovascular: Normal rate, regular rhythm and normal heart sounds.  Pulmonary/Chest: Effort normal and breath sounds normal. No respiratory distress.  Abdominal: Soft. Bowel sounds are normal. He exhibits no distension.  Musculoskeletal:  Generalized weakness currently in [facility]  Neurological: He is alert.  Skin: Skin is warm and dry. He is not diaphoretic.  unstageable wound on left heel being followed by wound care  Psychiatric: Affect normal. He exhibits disordered thought content and abnormal recent memory.    Filed Vitals:   12/17/12 1619  BP: 111/62  Pulse: 84  Temp: 97.1 F (36.2 C)  Resp: 20  SpO2: 95%      Labs reviewed: Basic Metabolic Panel:  Recent Labs  45/40/98 1840 10/21/12 0355 10/22/12 0349 10/23/12 0416  NA 137 136 136 135  K 4.0 4.6 4.4 4.0  CL 100 101 99 98  CO2 27 25 26 29   GLUCOSE 116* 115* 114* 114*  BUN 21 19 21 23   CREATININE 0.90 0.84 1.09 0.98  CALCIUM 9.8 9.2 9.1 8.8  MG  --  1.9  --   --   PHOS  --  3.5  --   --    Liver Function Tests:  Recent Labs  10/21/12 0355  AST 33  ALT 14  ALKPHOS 81  BILITOT 0.9  PROT 6.6  ALBUMIN 3.1*   No results found for this basename: LIPASE, AMYLASE,  in the last 8760 hours No results found for this basename: AMMONIA,  in the last 8760 hours CBC:  Recent Labs  10/20/12 1840 10/21/12 0355 10/22/12 0349 10/23/12 0416  WBC 18.7* 12.2* 18.1* 11.4*  NEUTROABS 14.6*   --   --   --   HGB 14.4 14.1 13.3 11.9*  HCT 42.2 40.8 39.5 35.2*  MCV 93.6 93.6 94.7 94.4  PLT 311 272 289 244   Cardiac Enzymes:  Recent Labs  10/20/12 1840  TROPONINI <0.30   Basic Metabolic Panel    Result: 09/20/2011 3:24 PM   ( Status: F )       Sodium 138     135-145 mEq/L SLN  Potassium 4.0     3.5-5.3 mEq/L SLN   Chloride 105     96-112 mEq/L SLN   CO2 26     19-32 mEq/L SLN   Glucose 110   H 70-99 mg/dL SLN   BUN 17     1-61 mg/dL SLN   Creatinine 0.96     0.50-1.35 mg/dL SLN   Calcium 8.8     0.4-54.0 mg/dL SLN   CBC NO Diff (Complete Blood Count)    Result: 11/20/2012 2:40 PM   ( Status: F )       WBC 9.4     4.0-10.5 K/uL SLN   RBC 3.63   L 4.22-5.81 MIL/uL SLN   Hemoglobin 11.1   L 13.0-17.0 g/dL SLN   Hematocrit 98.1   L 39.0-52.0 % SLN   MCV 93.9     78.0-100.0 fL SLN   MCH 30.6     26.0-34.0 pg SLN   MCHC 32.6     30.0-36.0 g/dL SLN   RDW 19.1     47.8-29.5 % SLN   Platelet Count 446   H 150-400 K/uL SLN   Comprehensive Metabolic Panel    Result: 11/20/2012 3:05 PM   ( Status: F )       Sodium 140     135-145 mEq/L SLN   Potassium 4.2     3.5-5.3 mEq/L SLN   Chloride 103     96-112 mEq/L SLN   CO2 32     19-32 mEq/L SLN   Glucose 85     70-99 mg/dL SLN   BUN 21     6-21 mg/dL SLN   Creatinine 3.08     0.50-1.35 mg/dL SLN   Bilirubin, Total 0.4     0.3-1.2 mg/dL SLN   Alkaline Phosphatase 194   H 39-117 U/L SLN   AST/SGOT 12     0-37 U/L SLN   ALT/SGPT <8     0-53 U/L SLN   Total Protein 5.5   L 6.0-8.3 g/dL SLN   Albumin 2.9   L 6.5-7.8 g/dL SLN   Calcium 8.8  Assessment/Plan 1. ESSENTIAL HYPERTENSION -Patients blood pressure is stable; continue current regimen. Will monitor and make changes as necessary.  2. Closed left hip fracture, sequela S/p ORIF; well heeled; pain has been controlled; doing well with thearpy  3. BPH (benign prostatic hyperplasia) Stable on current medications   4. Dementia without behavioral disturbance -scored 17/30  on bims indicating moderate memory impairment -will start Aricept 5 mg qhs now and increase to 10 mg in 4 weeks - will get TSH and vit D level

## 2012-12-18 DIAGNOSIS — L8995 Pressure ulcer of unspecified site, unstageable: Secondary | ICD-10-CM | POA: Diagnosis not present

## 2012-12-18 DIAGNOSIS — L89609 Pressure ulcer of unspecified heel, unspecified stage: Secondary | ICD-10-CM | POA: Diagnosis not present

## 2012-12-25 ENCOUNTER — Other Ambulatory Visit: Payer: Self-pay | Admitting: *Deleted

## 2012-12-25 DIAGNOSIS — L89609 Pressure ulcer of unspecified heel, unspecified stage: Secondary | ICD-10-CM | POA: Diagnosis not present

## 2012-12-25 DIAGNOSIS — L8995 Pressure ulcer of unspecified site, unstageable: Secondary | ICD-10-CM | POA: Diagnosis not present

## 2012-12-25 MED ORDER — HYDROCODONE-ACETAMINOPHEN 5-325 MG PO TABS: ORAL_TABLET | ORAL | Status: DC

## 2013-01-01 DIAGNOSIS — L8995 Pressure ulcer of unspecified site, unstageable: Secondary | ICD-10-CM | POA: Diagnosis not present

## 2013-01-01 DIAGNOSIS — L89609 Pressure ulcer of unspecified heel, unspecified stage: Secondary | ICD-10-CM | POA: Diagnosis not present

## 2013-01-07 ENCOUNTER — Encounter (HOSPITAL_COMMUNITY): Payer: Self-pay | Admitting: Emergency Medicine

## 2013-01-07 ENCOUNTER — Emergency Department (HOSPITAL_COMMUNITY)
Admission: EM | Admit: 2013-01-07 | Discharge: 2013-01-07 | Disposition: A | Discharge status: Skilled Nursing Facility | Payer: Medicare Other | Attending: Emergency Medicine | Admitting: Emergency Medicine

## 2013-01-07 ENCOUNTER — Emergency Department (HOSPITAL_COMMUNITY): Payer: Medicare Other

## 2013-01-07 DIAGNOSIS — T1490XA Injury, unspecified, initial encounter: Secondary | ICD-10-CM | POA: Diagnosis not present

## 2013-01-07 DIAGNOSIS — Z880 Allergy status to penicillin: Secondary | ICD-10-CM | POA: Insufficient documentation

## 2013-01-07 DIAGNOSIS — Z8701 Personal history of pneumonia (recurrent): Secondary | ICD-10-CM | POA: Insufficient documentation

## 2013-01-07 DIAGNOSIS — Z8669 Personal history of other diseases of the nervous system and sense organs: Secondary | ICD-10-CM | POA: Insufficient documentation

## 2013-01-07 DIAGNOSIS — Y9389 Activity, other specified: Secondary | ICD-10-CM | POA: Insufficient documentation

## 2013-01-07 DIAGNOSIS — M51379 Other intervertebral disc degeneration, lumbosacral region without mention of lumbar back pain or lower extremity pain: Secondary | ICD-10-CM | POA: Insufficient documentation

## 2013-01-07 DIAGNOSIS — S298XXA Other specified injuries of thorax, initial encounter: Secondary | ICD-10-CM | POA: Diagnosis not present

## 2013-01-07 DIAGNOSIS — M5137 Other intervertebral disc degeneration, lumbosacral region: Secondary | ICD-10-CM | POA: Insufficient documentation

## 2013-01-07 DIAGNOSIS — Z950 Presence of cardiac pacemaker: Secondary | ICD-10-CM | POA: Insufficient documentation

## 2013-01-07 DIAGNOSIS — Z862 Personal history of diseases of the blood and blood-forming organs and certain disorders involving the immune mechanism: Secondary | ICD-10-CM | POA: Insufficient documentation

## 2013-01-07 DIAGNOSIS — M19019 Primary osteoarthritis, unspecified shoulder: Secondary | ICD-10-CM | POA: Insufficient documentation

## 2013-01-07 DIAGNOSIS — R079 Chest pain, unspecified: Secondary | ICD-10-CM | POA: Diagnosis not present

## 2013-01-07 DIAGNOSIS — I10 Essential (primary) hypertension: Secondary | ICD-10-CM | POA: Insufficient documentation

## 2013-01-07 DIAGNOSIS — Z8719 Personal history of other diseases of the digestive system: Secondary | ICD-10-CM | POA: Insufficient documentation

## 2013-01-07 DIAGNOSIS — F0390 Unspecified dementia without behavioral disturbance: Secondary | ICD-10-CM | POA: Insufficient documentation

## 2013-01-07 DIAGNOSIS — N390 Urinary tract infection, site not specified: Secondary | ICD-10-CM | POA: Insufficient documentation

## 2013-01-07 DIAGNOSIS — J4489 Other specified chronic obstructive pulmonary disease: Secondary | ICD-10-CM | POA: Insufficient documentation

## 2013-01-07 DIAGNOSIS — D72829 Elevated white blood cell count, unspecified: Secondary | ICD-10-CM | POA: Insufficient documentation

## 2013-01-07 DIAGNOSIS — R609 Edema, unspecified: Secondary | ICD-10-CM | POA: Insufficient documentation

## 2013-01-07 DIAGNOSIS — IMO0002 Reserved for concepts with insufficient information to code with codable children: Secondary | ICD-10-CM | POA: Insufficient documentation

## 2013-01-07 DIAGNOSIS — R0789 Other chest pain: Secondary | ICD-10-CM

## 2013-01-07 DIAGNOSIS — J449 Chronic obstructive pulmonary disease, unspecified: Secondary | ICD-10-CM | POA: Insufficient documentation

## 2013-01-07 DIAGNOSIS — Z79899 Other long term (current) drug therapy: Secondary | ICD-10-CM | POA: Insufficient documentation

## 2013-01-07 DIAGNOSIS — Y9210 Unspecified residential institution as the place of occurrence of the external cause: Secondary | ICD-10-CM | POA: Insufficient documentation

## 2013-01-07 LAB — BASIC METABOLIC PANEL
Calcium: 9.2 mg/dL (ref 8.4–10.5)
Creatinine, Ser: 0.88 mg/dL (ref 0.50–1.35)
GFR calc Af Amer: 84 mL/min — ABNORMAL LOW (ref >90)
GFR calc non Af Amer: 72 mL/min — ABNORMAL LOW (ref >90)
Glucose, Bld: 132 mg/dL — ABNORMAL HIGH (ref 70–99)
Potassium: 4 mEq/L (ref 3.5–5.1)
Sodium: 137 mEq/L (ref 135–145)

## 2013-01-07 LAB — CBC WITH DIFFERENTIAL/PLATELET
Basophils Absolute: 0 10*3/uL (ref 0.0–0.1)
Basophils Relative: 0 % (ref 0–1)
Eosinophils Absolute: 0.1 10*3/uL (ref 0.0–0.7)
Eosinophils Relative: 1 % (ref 0–5)
Lymphs Abs: 2.7 10*3/uL (ref 0.7–4.0)
MCH: 30.5 pg (ref 26.0–34.0)
MCHC: 33.1 g/dL (ref 30.0–36.0)
MCV: 92.2 fL (ref 78.0–100.0)
Neutrophils Relative %: 71 % (ref 43–77)
Platelets: 403 10*3/uL — ABNORMAL HIGH (ref 150–400)
RBC: 4.59 MIL/uL (ref 4.22–5.81)
RDW: 14.6 % (ref 11.5–15.5)

## 2013-01-07 LAB — URINALYSIS, ROUTINE W REFLEX MICROSCOPIC
Bilirubin Urine: NEGATIVE
Glucose, UA: NEGATIVE mg/dL
Ketones, ur: NEGATIVE mg/dL
Protein, ur: NEGATIVE mg/dL
Urobilinogen, UA: 0.2 mg/dL (ref 0.0–1.0)

## 2013-01-07 LAB — URINE MICROSCOPIC-ADD ON

## 2013-01-07 MED ORDER — ACETAMINOPHEN 325 MG PO TABS
650.0000 mg | ORAL_TABLET | Freq: Once | ORAL | Status: AC
  Administered 2013-01-07: 650 mg via ORAL
  Filled 2013-01-07: qty 2

## 2013-01-07 MED ORDER — SULFAMETHOXAZOLE-TMP DS 800-160 MG PO TABS
1.0000 | ORAL_TABLET | Freq: Once | ORAL | Status: AC
  Administered 2013-01-07: 1 via ORAL
  Filled 2013-01-07: qty 1

## 2013-01-07 MED ORDER — SULFAMETHOXAZOLE-TMP DS 800-160 MG PO TABS: 1.0000 | ORAL_TABLET | Freq: Two times a day (BID) | ORAL | Status: DC

## 2013-01-07 MED ORDER — LEVOFLOXACIN IN D5W 750 MG/150ML IV SOLN
750.0000 mg | Freq: Once | INTRAVENOUS | Status: DC
  Filled 2013-01-07: qty 150

## 2013-01-07 NOTE — ED Notes (Addendum)
Pt arrives via EMS from [place] after c/o chest pain. Reports that he was in PT and the rowing machine hit him in the right side of the rib. Hx pacemaker. 324 of ASA given. 150/72 HR 80

## 2013-01-07 NOTE — ED Notes (Signed)
Attempted to call [facility] x3 - no answer. Report and dc instructions given to PETAR.

## 2013-01-07 NOTE — ED Provider Notes (Signed)
CSN: [id number]     Arrival date & time 01/07/13  0021 History   First MD Initiated Contact with Patient 01/07/13 0024     Chief Complaint  Patient presents with  . Chest Pain   (Consider location/radiation/quality/duration/timing/severity/associated sxs/prior Treatment) HPI Comments: 79 yo male with dementia, pacemaker, htn presents with right rib pain after hitting it on the row machine earlier today. Difficulty obtaining details due to dementia.  Pt from [place].  No other sxs.  Worse with palpation.  Patient is a 79 y.o. male presenting with chest pain. The history is provided by the patient and the EMS personnel.  Chest Pain   Past Medical History  Diagnosis Date  . Syncope     Syncope in the setting of complete heart block status post permanent pacemaker placement on September 27, 2007  . Hypertension   . Dementia     thought this appears to be mild based on reports of family as of 8/11  . Elevated TSH     Mildly elevated TSH  . Atrioventricular block, complete   . Pacemaker -MDT   . COPD (chronic obstructive pulmonary disease) 04/1995    X-ray  . BPH (benign prostatic hyperplasia)   . Tremor   . Pneumonia 04/1995    Bilateral upper lobes  . Anemia 04/1995    Post op, 2nd to hemorrhage, left leg  . UTI (urinary tract infection) 05/1995  . DJD (degenerative joint disease), lumbar 04/1995    Spine, severe.   X-ray  . DJD (degenerative joint disease) 04/1995    Both shoulders, severe  . HOH (hard of hearing)   . Inguinal hernia    Past Surgical History  Procedure Laterality Date  . Pacemaker insertion    . Cholecystectomy  1988  . Hernia repair  Years ago    Left inguinal  . Ventral hernia repair  04/1995    with intra abdominal adhesions, lysis incidental appendectomy  . Pulmonary embolism surgery  04/1995    Post-op  . 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 Hematoma, left leg  05/1995    Probably 2nd to anticoag  . Rbbb and left  anterior fascicular block & bifascular block  05/1995  . Fractured left wrist  1990    with old right posterior rib fracture  . Syncope comp heart block  9/10- 09/30/2007    HOSP Perm Pacer placed, HTN, dementia, mildly elevated TSH  . Pacer placement (dr. [name])  09/27/2007  . Ct angio chest  09/27/07    No PE, No acute prob  . Femur im nail Left 10/21/2012    Procedure: INTRAMEDULLARY (IM) NAIL FEMORAL;  Surgeon: Cammy Copa, MD;  Location: [facility];  Service: Orthopedics;  Laterality: Left;   Family History  Problem Relation Age of Onset  . Heart disease Father     Hardening of the arteries, heart trouble   History  Substance Use Topics  . Smoking status: Never Smoker   . Smokeless tobacco: Not on file  . Alcohol Use: No    Review of Systems  Unable to perform ROS: Dementia  Cardiovascular: Positive for chest pain.    Allergies  Penicillins  Home Medications   Current Outpatient Rx  Name  Route  Sig  Dispense  Refill  . bisacodyl (DULCOLAX) 10 MG suppository   Rectal   Place 10 mg rectally as needed for moderate constipation.         . Calcium  Citrate 200 MG TABS   Oral   Take 1 tablet by mouth daily.         Marland Kitchen docusate sodium 100 MG CAPS   Oral   Take 100 mg by mouth 2 (two) times daily as needed for constipation.   10 capsule   0   . donepezil (ARICEPT) 5 MG tablet   Oral   Take 5 mg by mouth at bedtime.         . finasteride (PROSCAR) 5 MG tablet   Oral   Take 1 tablet (5 mg total) by mouth daily.   90 tablet   3   . hydrochlorothiazide (MICROZIDE) 12.5 MG capsule   Oral   Take 12.5 mg by mouth every morning.         Marland Kitchen HYDROcodone-acetaminophen (NORCO) 5-325 MG per tablet   Oral   Take 1-2 tablets by mouth every 6 (six) hours as needed for pain.   60 tablet   0   . UNABLE TO FIND      2 (two) times daily. Magic Cup         . Vitamin D, Ergocalciferol, (DRISDOL) 50000 UNITS CAPS capsule   Oral   Take 50,000 Units by mouth  every 7 (seven) days. On saturdays          BP 102/62  Pulse 105  Temp(Src) 99.3 F (37.4 C) (Oral)  Resp 30  SpO2 95% Physical Exam  Nursing note and vitals reviewed. Constitutional: He appears well-developed and well-nourished.  HENT:  Head: Normocephalic and atraumatic.  Eyes: Conjunctivae are normal. Right eye exhibits no discharge. Left eye exhibits no discharge.  Neck: Normal range of motion. Neck supple. No tracheal deviation present.  Cardiovascular: Normal rate and regular rhythm.   Pulmonary/Chest: Effort normal and breath sounds normal.  Abdominal: Soft. He exhibits no distension. There is no tenderness. There is no guarding.  Musculoskeletal: He exhibits edema (mild bilateral LE) and tenderness (right lower anterior rib).  Neurological: He is alert. GCS eye subscore is 4. GCS verbal subscore is 5. GCS motor subscore is 6.  Alert, answers some questions appropriate dementia  Skin: Skin is warm.    ED Course  Procedures (including critical care time) Labs Review Labs Reviewed  CBC WITH DIFFERENTIAL - Abnormal; Notable for the following:    WBC 18.9 (*)    Platelets 403 (*)    Neutro Abs 13.4 (*)    Monocytes Relative 14 (*)    Monocytes Absolute 2.7 (*)    All other components within normal limits  BASIC METABOLIC PANEL - Abnormal; Notable for the following:    Glucose, Bld 132 (*)    GFR calc non Af Amer 72 (*)    GFR calc Af Amer 84 (*)    All other components within normal limits  URINALYSIS, ROUTINE W REFLEX MICROSCOPIC - Abnormal; Notable for the following:    Hgb urine dipstick SMALL (*)    Leukocytes, UA SMALL (*)    All other components within normal limits  URINE MICROSCOPIC-ADD ON - Abnormal; Notable for the following:    Squamous Epithelial / LPF FEW (*)    Bacteria, UA MANY (*)    Casts HYALINE CASTS (*)    All other components within normal limits  URINE CULTURE  TROPONIN I   Imaging Review Dg Chest 2 View  01/07/2013   CLINICAL DATA:   Right-sided chest pain following trauma.  EXAM: CHEST  2 VIEW  COMPARISON:  10/20/2012.  FINDINGS: Low volume chest. Basilar atelectasis. Two lead left subclavian pacemaker. Cholecystectomy clips are present in the right upper quadrant. Chronic right rotator cuff tear and glenohumeral osteoarthritis. Left glenohumeral osteoarthritis is also present. Probable chronic left rotator cuff tear as well. There is no displaced rib fracture or pneumothorax identified. Aortic arch atherosclerosis.  Depression of the sternal body on the lateral view appears to be a chronic finding and was probably present on the prior exam 09/11/2011 allowing for changes in projection. a  IMPRESSION: No acute abnormality.  Low volume chest.   Electronically Signed   By: Andreas Newport M.D.   On: 01/07/2013 01:26    EKG Interpretation    Date/Time:  Tuesday January 07 2013 00:29:10 EST Ventricular Rate:  86 PR Interval:  273 QRS Duration: 142 QT Interval:  425 QTC Calculation: 508 R Axis:   -13 Text Interpretation:  A-V dual-paced rhythm with some inhibition No further analysis attempted due to paced rhythm Confirmed by Talicia Sui  MD, Mialani Reicks ([id number]) on 01/07/2013 3:17:59 AM            MDM   1. Right-sided chest wall pain   2. Leukocytosis, unspecified   UTI Focal complaint. WBC elevation similar to previous.  Non toxic appearing. Pain meds given. Close fup outpt.   Long QT hx, bactrim po given, pt asking to go home, asked nurse to touch base with NH and ensure close fup oupt. Pt well appearing on recheck, hr normal, no fever.   Results and differential diagnosis were discussed with the patient. Close follow up outpatient was discussed, patient comfortable with the plan.   Diagnosis: above    Enid Skeens, MD 01/07/13 [phone]

## 2013-01-07 NOTE — ED Notes (Signed)
Pt taken to xray 

## 2013-01-09 LAB — URINE CULTURE: Colony Count: >=100000

## 2013-01-10 ENCOUNTER — Telehealth (HOSPITAL_COMMUNITY): Payer: Self-pay | Admitting: Emergency Medicine

## 2013-01-10 NOTE — ED Notes (Signed)
Post ED Visit - Positive Culture Follow-up  Culture report reviewed by antimicrobial stewardship pharmacist: []  Wes Dulaney, Pharm.D., BCPS []  Celedonio Miyamoto, Pharm.D., BCPS []  Georgina Pillion, Pharm.D., BCPS []  [place], [address].D., BCPS, AAHIVP [x]  Estella Husk, Pharm.D., BCPS, AAHIVP  Positive urine culture Per pharmacist, fax results to nursing home and no further patient follow-up is required at this time.  Marcelle Overlie, Jenel Lucks 01/10/2013, 11:27 AM

## 2013-01-15 DIAGNOSIS — L89609 Pressure ulcer of unspecified heel, unspecified stage: Secondary | ICD-10-CM | POA: Diagnosis not present

## 2013-01-15 DIAGNOSIS — L8993 Pressure ulcer of unspecified site, stage 3: Secondary | ICD-10-CM | POA: Diagnosis not present

## 2013-01-16 DIAGNOSIS — N400 Enlarged prostate without lower urinary tract symptoms: Secondary | ICD-10-CM | POA: Diagnosis not present

## 2013-01-16 DIAGNOSIS — M25569 Pain in unspecified knee: Secondary | ICD-10-CM | POA: Diagnosis not present

## 2013-01-16 DIAGNOSIS — Z950 Presence of cardiac pacemaker: Secondary | ICD-10-CM | POA: Diagnosis not present

## 2013-01-16 DIAGNOSIS — Z7901 Long term (current) use of anticoagulants: Secondary | ICD-10-CM | POA: Diagnosis not present

## 2013-01-16 DIAGNOSIS — S72009A Fracture of unspecified part of neck of unspecified femur, initial encounter for closed fracture: Secondary | ICD-10-CM | POA: Diagnosis not present

## 2013-01-16 DIAGNOSIS — R0902 Hypoxemia: Secondary | ICD-10-CM | POA: Diagnosis not present

## 2013-01-16 DIAGNOSIS — J189 Pneumonia, unspecified organism: Secondary | ICD-10-CM | POA: Diagnosis not present

## 2013-01-16 DIAGNOSIS — L8993 Pressure ulcer of unspecified site, stage 3: Secondary | ICD-10-CM | POA: Diagnosis not present

## 2013-01-16 DIAGNOSIS — L97409 Non-pressure chronic ulcer of unspecified heel and midfoot with unspecified severity: Secondary | ICD-10-CM | POA: Diagnosis not present

## 2013-01-16 DIAGNOSIS — L8995 Pressure ulcer of unspecified site, unstageable: Secondary | ICD-10-CM | POA: Diagnosis not present

## 2013-01-16 DIAGNOSIS — L89609 Pressure ulcer of unspecified heel, unspecified stage: Secondary | ICD-10-CM | POA: Diagnosis not present

## 2013-01-16 DIAGNOSIS — Z9181 History of falling: Secondary | ICD-10-CM | POA: Diagnosis not present

## 2013-01-16 DIAGNOSIS — J449 Chronic obstructive pulmonary disease, unspecified: Secondary | ICD-10-CM | POA: Diagnosis not present

## 2013-01-16 DIAGNOSIS — I10 Essential (primary) hypertension: Secondary | ICD-10-CM | POA: Diagnosis not present

## 2013-01-21 ENCOUNTER — Non-Acute Institutional Stay (SKILLED_NURSING_FACILITY): Payer: Medicare Other | Admitting: Nurse Practitioner

## 2013-01-21 DIAGNOSIS — N400 Enlarged prostate without lower urinary tract symptoms: Secondary | ICD-10-CM

## 2013-01-21 DIAGNOSIS — L97429 Non-pressure chronic ulcer of left heel and midfoot with unspecified severity: Secondary | ICD-10-CM

## 2013-01-21 DIAGNOSIS — I10 Essential (primary) hypertension: Secondary | ICD-10-CM

## 2013-01-21 DIAGNOSIS — L97409 Non-pressure chronic ulcer of unspecified heel and midfoot with unspecified severity: Secondary | ICD-10-CM | POA: Diagnosis not present

## 2013-01-21 NOTE — Progress Notes (Signed)
Patient ID: Seth Hunter, male   DOB: 20-May-1918, 80 y.o.   MRN: [id number]    Nursing Home Location:  [place] of Service: SNF (31)  PCP: Elsie Stain, MD  Allergies  Allergen Reactions  . Penicillins Other (See Comments)    unknown    Chief Complaint  Patient presents with  . Medical Managment of Chronic Issues    HPI:  Seth Hunter is a 80 y.o. male with a past medical history of syncope, hypertension, dementia, elevated TSH, AV block, packmaker, COPD, BPH, Tremor; who is being seen today for routine follow up on chronic conditions ; pt is at [facility] after a mechanical fall due to tripping now which led to a Intertrochanteric left femoral fracture now status post ORIF. Pt is being treated by wound care for unstageable left heel ulcer; in the last month pt went to ED and was dx with UTI and was treated with antibiotics which he tolerated well and has no ongoing symptoms  Review of Systems:  Review of Systems  Constitutional: Negative for fever and chills.  HENT: Negative for nosebleeds.   Respiratory: Negative for shortness of breath.   Cardiovascular: Negative for chest pain.  Gastrointestinal: Negative for heartburn, abdominal pain, constipation and blood in stool.  Genitourinary: Negative for dysuria and hematuria.  Skin: Negative.        Wound on left heel  Neurological: Negative for dizziness, weakness and headaches.  Endo/Heme/Allergies: Does not bruise/bleed easily.  Psychiatric/Behavioral: Positive for memory loss. Negative for depression. The patient is not nervous/anxious.      Past Medical History  Diagnosis Date  . Syncope     Syncope in the setting of complete heart block status post permanent pacemaker placement on September 27, 2007  . Hypertension   . Dementia     thought this appears to be mild based on reports of family as of 8/11  . Elevated TSH     Mildly elevated TSH  . Atrioventricular block, complete   . Pacemaker  -MDT   . COPD (chronic obstructive pulmonary disease) 04/1995    X-ray  . BPH (benign prostatic hyperplasia)   . Tremor   . Pneumonia 04/1995    Bilateral upper lobes  . Anemia 04/1995    Post op, 2nd to hemorrhage, left leg  . UTI (urinary tract infection) 05/1995  . DJD (degenerative joint disease), lumbar 04/1995    Spine, severe.   X-ray  . DJD (degenerative joint disease) 04/1995    Both shoulders, severe  . HOH (hard of hearing)   . Inguinal hernia    Past Surgical History  Procedure Laterality Date  . Pacemaker insertion    . Cholecystectomy  1988  . Hernia repair  Years ago    Left inguinal  . Ventral hernia repair  04/1995    with intra abdominal adhesions, lysis incidental appendectomy  . Pulmonary embolism surgery  04/1995    Post-op  . 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 Hematoma, left leg  05/1995    Probably 2nd to anticoag  . 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 Fractured left wrist  1990    with old right posterior rib fracture  . Syncope comp heart block  9/10- 09/30/2007    HOSP Perm Pacer placed, HTN, dementia, mildly elevated TSH  . Pacer placement (dr. Lovena Le)  09/27/2007  . Ct angio chest  09/27/07    No PE, No acute prob  . Femur im nail Left 10/21/2012    Procedure: INTRAMEDULLARY (IM) NAIL FEMORAL;  Surgeon: Meredith Pel, MD;  Location: [facility];  Service: Orthopedics;  Laterality: Left;   Social History:   reports that he has never smoked. He does not have any smokeless tobacco history on file. He reports that he does not drink alcohol or use illicit drugs.  Family History  Problem Relation Age of Onset  . Heart disease Father     Hardening of the arteries, heart trouble    Medications: Patient's Medications  New Prescriptions   No medications on file  Previous Medications   BISACODYL (DULCOLAX) 10 MG SUPPOSITORY    Place 10 mg rectally as needed for moderate constipation.    CALCIUM CITRATE 200 MG TABS    Take 1 tablet by mouth daily.   DOCUSATE SODIUM 100 MG CAPS    Take 100 mg by mouth 2 (two) times daily as needed for constipation.   DONEPEZIL (ARICEPT) 5 MG TABLET    Take 5 mg by mouth at bedtime.   FINASTERIDE (PROSCAR) 5 MG TABLET    Take 1 tablet (5 mg total) by mouth daily.   HYDROCHLOROTHIAZIDE (MICROZIDE) 12.5 MG CAPSULE    Take 12.5 mg by mouth every morning.   HYDROCODONE-ACETAMINOPHEN (NORCO) 5-325 MG PER TABLET    Take 1-2 tablets by mouth every 6 (six) hours as needed for pain.   SULFAMETHOXAZOLE-TRIMETHOPRIM (BACTRIM DS) 800-160 MG PER TABLET    Take 1 tablet by mouth 2 (two) times daily.   UNABLE TO FIND    2 (two) times daily. Magic Cup   VITAMIN D, ERGOCALCIFEROL, (DRISDOL) 50000 UNITS CAPS CAPSULE    Take 50,000 Units by mouth every 7 (seven) days. On saturdays  Modified Medications   No medications on file  Discontinued Medications   No medications on file     Physical Exam:  Filed Vitals:   01/21/13 1500  BP: 130/68  Pulse: 78  Temp: 98.7 F (37.1 C)  Resp: 20   Physical Exam  Constitutional: He is well-developed, well-nourished, and in no distress. No distress.  HENT:  Mouth/Throat: Oropharynx is clear and moist. No oropharyngeal exudate.  Eyes: Conjunctivae and EOM are normal. Pupils are equal, round, and reactive to light.  Neck: Normal range of motion. Neck supple.  Cardiovascular: Normal rate, regular rhythm and normal heart sounds.   Pulmonary/Chest: Effort normal and breath sounds normal. No respiratory distress.  Abdominal: Soft. Bowel sounds are normal. He exhibits no distension.  Musculoskeletal: He exhibits no edema and no tenderness.  Generalized weakness   Neurological: He is alert.  Skin: Skin is warm and dry. He is not diaphoretic.  unstageable wound on left heel being followed by wound care; dressing CDI  Psychiatric: Affect normal. He exhibits disordered thought content and abnormal recent memory.       Labs reviewed: Basic Metabolic Panel:  Recent Labs  10/20/12 1840 10/21/12 0355 10/22/12 0349 10/23/12 0416 01/07/13 0045  NA 137 136 136 135 137  K 4.0 4.6 4.4 4.0 4.0  CL 100 101 99 98 98  CO2 27 25 26 29 29   GLUCOSE 116* 115* 114* 114* 132*  BUN 21 19 21 23 22   CREATININE 0.90 0.84 1.09 0.98 0.88  CALCIUM 9.8 9.2 9.1 8.8 9.2  MG  --  1.9  --   --   --   PHOS  --  3.5  --   --   --  Liver Function Tests:  Recent Labs  10/21/12 0355  AST 33  ALT 14  ALKPHOS 81  BILITOT 0.9  PROT 6.6  ALBUMIN 3.1*   No results found for this basename: LIPASE, AMYLASE,  in the last 8760 hours No results found for this basename: AMMONIA,  in the last 8760 hours CBC:  Recent Labs  10/20/12 1840  10/22/12 0349 10/23/12 0416 01/07/13 0045  WBC 18.7*  < > 18.1* 11.4* 18.9*  NEUTROABS 14.6*  --   --   --  13.4*  HGB 14.4  < > 13.3 11.9* 14.0  HCT 42.2  < > 39.5 35.2* 42.3  MCV 93.6  < > 94.7 94.4 92.2  PLT 311  < > 289 244 403*  < > = values in this interval not displayed.  Basic Metabolic Panel  Result: 09/22/6732 3:24 PM ( Status: F )  Sodium 138 135-145 mEq/L SLN  Potassium 4.0 3.5-5.3 mEq/L SLN  Chloride 105 96-112 mEq/L SLN  CO2 26 19-32 mEq/L SLN  Glucose 110 H 70-99 mg/dL SLN  BUN 17 6-23 mg/dL SLN  Creatinine 0.89 0.50-1.35 mg/dL SLN  Calcium 8.8 8.4-10.5 mg/dL SLN  CBC NO Diff (Complete Blood Count)  Result: 11/20/2012 2:40 PM ( Status: F )  WBC 9.4 4.0-10.5 K/uL SLN  RBC 3.63 L 4.22-5.81 MIL/uL SLN  Hemoglobin 11.1 L 13.0-17.0 g/dL SLN  Hematocrit 34.1 L 39.0-52.0 % SLN  MCV 93.9 78.0-100.0 fL SLN  MCH 30.6 26.0-34.0 pg SLN  MCHC 32.6 30.0-36.0 g/dL SLN  RDW 14.1 11.5-15.5 % SLN  Platelet Count 446 H 150-400 K/uL SLN  Comprehensive Metabolic Panel  Result: 19/03/7900 3:05 PM ( Status: F )  Sodium 140 135-145 mEq/L SLN  Potassium 4.2 3.5-5.3 mEq/L SLN  Chloride 103 96-112 mEq/L SLN  CO2 32 19-32 mEq/L SLN  Glucose 85 70-99 mg/dL SLN  BUN 21 6-23  mg/dL SLN  Creatinine 0.80 0.50-1.35 mg/dL SLN  Bilirubin, Total 0.4 0.3-1.2 mg/dL SLN  Alkaline Phosphatase 194 H 39-117 U/L SLN  AST/SGOT 12 0-37 U/L SLN  ALT/SGPT <8 0-53 U/L SLN  Total Protein 5.5 L 6.0-8.3 g/dL SLN  Albumin 2.9 L 3.5-5.2 g/dL SLN  Calcium 8.8 12/18/12 TSH 4.36, Vit D 23  Assessment/Plan 1. BPH (benign prostatic hyperplasia) -conts on proscar   2. ESSENTIAL HYPERTENSION -Patient is stable; continue current regimen. Will monitor and make changes as necessary.  3. Ulcer of left heel, with unspecified severity -with santyl daily -followed by treatment nurse and wound care  4. Dementia without behaviors -conts Aricept 10 mg daily   5. Vit D def - Vit d 50000 units weekly

## 2013-01-22 DIAGNOSIS — L89609 Pressure ulcer of unspecified heel, unspecified stage: Secondary | ICD-10-CM | POA: Diagnosis not present

## 2013-01-22 DIAGNOSIS — L8993 Pressure ulcer of unspecified site, stage 3: Secondary | ICD-10-CM | POA: Diagnosis not present

## 2013-01-27 ENCOUNTER — Encounter: Payer: Self-pay | Admitting: Internal Medicine

## 2013-01-27 ENCOUNTER — Non-Acute Institutional Stay (SKILLED_NURSING_FACILITY): Payer: Medicare Other | Admitting: Internal Medicine

## 2013-01-27 DIAGNOSIS — J189 Pneumonia, unspecified organism: Secondary | ICD-10-CM

## 2013-01-27 NOTE — Progress Notes (Signed)
MRN: [id number] Name: Seth Hunter  Sex: male Age: 80 y.o. DOB: 03-Nov-1918  [place] #: Helene Kelp Facility/Room:222B Level Of Care: SNF Provider: Inocencio Homes D Emergency Contacts: Extended Emergency Contact Information Primary Emergency Contact: Charolett Bumpers [place] Phone: [phone] Relation: Niece  Code Status: DNR  Allergies: Penicillins  Chief Complaint  Patient presents with  . Acute Visit    HPI: Patient is 80 y.o. male who the nurses have asked me to see because he has been coughing for 3 days and robitussin is not providing relief.  Past Medical History  Diagnosis Date  . Syncope     Syncope in the setting of complete heart block status post permanent pacemaker placement on September 27, 2007  . Hypertension   . Dementia     thought this appears to be mild based on reports of family as of 8/11  . Elevated TSH     Mildly elevated TSH  . Atrioventricular block, complete   . Pacemaker -MDT   . COPD (chronic obstructive pulmonary disease) 04/1995    X-ray  . BPH (benign prostatic hyperplasia)   . Tremor   . Pneumonia 04/1995    Bilateral upper lobes  . Anemia 04/1995    Post op, 2nd to hemorrhage, left leg  . UTI (urinary tract infection) 05/1995  . DJD (degenerative joint disease), lumbar 04/1995    Spine, severe.   X-ray  . DJD (degenerative joint disease) 04/1995    Both shoulders, severe  . HOH (hard of hearing)   . Inguinal hernia     Past Surgical History  Procedure Laterality Date  . Pacemaker insertion    . Cholecystectomy  1988  . Hernia repair  Years ago    Left inguinal  . Ventral hernia repair  04/1995    with intra abdominal adhesions, lysis incidental appendectomy  . Pulmonary embolism surgery  04/1995    Post-op  . 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 Hematoma, left leg  05/1995    Probably 2nd to anticoag  . 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  Fractured left wrist  1990    with old right posterior rib fracture  . Syncope comp heart block  9/10- 09/30/2007    HOSP Perm Pacer placed, HTN, dementia, mildly elevated TSH  . Pacer placement (dr. Lovena Le)  09/27/2007  . Ct angio chest  09/27/07    No PE, No acute prob  . Femur im nail Left 10/21/2012    Procedure: INTRAMEDULLARY (IM) NAIL FEMORAL;  Surgeon: Meredith Pel, MD;  Location: [facility];  Service: Orthopedics;  Laterality: Left;      Medication List       This list is accurate as of: 01/27/13  2:45 PM.  Always use your most recent med list.               bisacodyl 10 MG suppository  Commonly known as:  DULCOLAX  Place 10 mg rectally as needed for moderate constipation.     Calcium Citrate 200 MG Tabs  Take 1 tablet by mouth daily.     donepezil 5 MG tablet  Commonly known as:  ARICEPT  Take 10 mg by mouth at bedtime.     DSS 100 MG Caps  Take 100 mg by mouth 2 (two) times daily as needed for constipation.     finasteride 5 MG tablet  Commonly known as:  PROSCAR  Take 1 tablet (5 mg total) by mouth daily.     hydrochlorothiazide 12.5 MG capsule  Commonly known as:  MICROZIDE  Take 12.5 mg by mouth every morning.     HYDROcodone-acetaminophen 5-325 MG per tablet  Commonly known as:  NORCO  Take 1-2 tablets by mouth every 6 (six) hours as needed for pain.     UNABLE TO FIND  2 (two) times daily. Magic Cup     Vitamin D (Ergocalciferol) 50000 UNITS Caps capsule  Commonly known as:  DRISDOL  Take 50,000 Units by mouth every 7 (seven) days. On saturdays        No orders of the defined types were placed in this encounter.     There is no immunization history on file for this patient.  History  Substance Use Topics  . Smoking status: Never Smoker   . Smokeless tobacco: Not on file  . Alcohol Use: No    Review of Systems  DATA OBTAINED: from patient, nurse  GENERAL: Feels well no fevers, fatigue, appetite changes SKIN: No itching,  rash HEENT: No complaint RESPIRATORY:+cough for 3 days, no wheezing, a little SOB;admits feels like he did with PNA;cant get the stuff coughed up CARDIAC: No chest pain, palpitations, lower extremity edema  GI: No abdominal pain, No N/V/D or constipation, No heartburn or reflux  GU: No dysuria, frequency or urgency, or incontinence  MUSCULOSKELETAL: No unrelieved bone/joint pain NEUROLOGIC: No headache, dizziness or focal weakness PSYCHIATRIC: No overt anxiety or sadness. Sleeps well.   Filed Vitals:   01/27/13 1444  BP: 132/74  Pulse: 72  Temp: 97.4 F (36.3 C)  Resp: 18    Physical Exam  GENERAL APPEARANCE: Alert, conversant. Appropriately groomed. No acute distress  SKIN: No diaphoresis rash; L foor dressed and hard soled shoe HEENT: Unremarkable RESPIRATORY: Breathing is even, unlabored. Lung sounds rales, wheezing RUL    Bedside O2 sat  86% rising slowly to 92% RA with pulse 133 CARDIOVASCULAR: Heart RRR no murmurs, rubs or gallops. No peripheral edema  GASTROINTESTINAL: Abdomen is soft, non-tender, not distended w/ normal bowel sounds.  GENITOURINARY: Bladder non tender, not distended  MUSCULOSKELETAL: No abnormal joints or musculature NEUROLOGIC: Cranial nerves 2-12 grossly intact. Moves all extremities, some tremor. PSYCHIATRIC: Mood and affect appropriate to situation, no behavioral issues  Patient Active Problem List   Diagnosis Date Noted  . Ulcer of left heel 01/21/2013  . Anticoagulation goal of INR 2 to 3 10/28/2012  . BPH (benign prostatic hyperplasia) 10/21/2012  . Leukocytosis, unspecified 10/21/2012  . Closed left hip fracture 10/20/2012  . Pacemaker -MDT   . ESSENTIAL HYPERTENSION 09/26/2007    CBC    Component Value Date/Time   WBC 18.9* 01/07/2013 0045   RBC 4.59 01/07/2013 0045   HGB 14.0 01/07/2013 0045   HCT 42.3 01/07/2013 0045   PLT 403* 01/07/2013 0045   MCV 92.2 01/07/2013 0045   LYMPHSABS 2.7 01/07/2013 0045   MONOABS 2.7* 01/07/2013  0045   EOSABS 0.1 01/07/2013 0045   BASOSABS 0.0 01/07/2013 0045    CMP     Component Value Date/Time   NA 137 01/07/2013 0045   K 4.0 01/07/2013 0045   CL 98 01/07/2013 0045   CO2 29 01/07/2013 0045   GLUCOSE 132* 01/07/2013 0045   BUN 22 01/07/2013 0045   CREATININE 0.88 01/07/2013 0045   CALCIUM 9.2 01/07/2013 0045   PROT 6.6 10/21/2012 0355   ALBUMIN 3.1* 10/21/2012 0355   AST 33  10/21/2012 0355   ALT 14 10/21/2012 0355   ALKPHOS 81 10/21/2012 0355   BILITOT 0.9 10/21/2012 0355   GFRNONAA 72* 01/07/2013 0045   GFRAA 84* 01/07/2013 0045    Assessment and Plan  PNA probable - with rales RUL, dec O2 sat, elevated pulse, and wanting to cough up sputum and can't; PE another possibility; CXR, stat, alb nebs DID for a week;PO2 2L [place] for a week, levaquin 500 mg daily for 10 days, floraster 250 mg BID for 14 days.  Hennie Duos, MD

## 2013-01-28 ENCOUNTER — Encounter: Payer: Self-pay | Admitting: Nurse Practitioner

## 2013-01-28 ENCOUNTER — Non-Acute Institutional Stay (SKILLED_NURSING_FACILITY): Payer: Medicare Other | Admitting: Nurse Practitioner

## 2013-01-28 DIAGNOSIS — S72009A Fracture of unspecified part of neck of unspecified femur, initial encounter for closed fracture: Secondary | ICD-10-CM

## 2013-01-28 DIAGNOSIS — J189 Pneumonia, unspecified organism: Secondary | ICD-10-CM

## 2013-01-28 DIAGNOSIS — I10 Essential (primary) hypertension: Secondary | ICD-10-CM | POA: Diagnosis not present

## 2013-01-28 DIAGNOSIS — S72002A Fracture of unspecified part of neck of left femur, initial encounter for closed fracture: Secondary | ICD-10-CM

## 2013-01-28 DIAGNOSIS — L97409 Non-pressure chronic ulcer of unspecified heel and midfoot with unspecified severity: Secondary | ICD-10-CM

## 2013-01-28 DIAGNOSIS — L97429 Non-pressure chronic ulcer of left heel and midfoot with unspecified severity: Secondary | ICD-10-CM

## 2013-01-28 NOTE — Progress Notes (Signed)
Patient ID: Seth Hunter, male   DOB: 1918/06/24, 80 y.o.   MRN: [id number]    Nursing Home Location:  [place] of Service: SNF (31)  PCP: Elsie Stain, MD  Allergies  Allergen Reactions  . Penicillins Other (See Comments)    unknown    Chief Complaint  Patient presents with  . Discharge Note    HPI:  Seth Hunter is a 80 y.o. male with a past medical history of syncope, hypertension, dementia, elevated TSH, AV block, packmaker, COPD, BPH, Tremor; who is being seen today for discharge home; pt is at [facility] after a mechanical fall due to tripping now which led to a Intertrochanteric left femoral fracture now status post ORIF. Pt is being treated by wound care for unstageable left heel ulcer and will need ongoing wound care for this; pt currently being treated for HCAP but is doing well with treatment and stable to be discharge home with [facility] therapies   Review of Systems:  Review of Systems  Constitutional: Negative for fever and chills.  HENT: Negative for nosebleeds.   Respiratory: Negative for cough, sputum production and shortness of breath.   Cardiovascular: Negative for chest pain.  Gastrointestinal: Negative for heartburn, abdominal pain, constipation and blood in stool.  Genitourinary: Negative for dysuria and hematuria.  Musculoskeletal: Negative for myalgias.  Skin: Negative.        Wound on left heel  Neurological: Negative for dizziness, weakness and headaches.  Endo/Heme/Allergies: Does not bruise/bleed easily.  Psychiatric/Behavioral: Positive for memory loss. Negative for depression. The patient is not nervous/anxious.      Past Medical History  Diagnosis Date  . Syncope     Syncope in the setting of complete heart block status post permanent pacemaker placement on September 27, 2007  . Hypertension   . Dementia     thought this appears to be mild based on reports of family as of 8/11  . Elevated TSH     Mildly elevated TSH  .  Atrioventricular block, complete   . Pacemaker -MDT   . COPD (chronic obstructive pulmonary disease) 04/1995    X-ray  . BPH (benign prostatic hyperplasia)   . Tremor   . Pneumonia 04/1995    Bilateral upper lobes  . Anemia 04/1995    Post op, 2nd to hemorrhage, left leg  . UTI (urinary tract infection) 05/1995  . DJD (degenerative joint disease), lumbar 04/1995    Spine, severe.   X-ray  . DJD (degenerative joint disease) 04/1995    Both shoulders, severe  . HOH (hard of hearing)   . Inguinal hernia    Past Surgical History  Procedure Laterality Date  . Pacemaker insertion    . Cholecystectomy  1988  . Hernia repair  Years ago    Left inguinal  . Ventral hernia repair  04/1995    with intra abdominal adhesions, lysis incidental appendectomy  . Pulmonary embolism surgery  04/1995    Post-op  . 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 Hematoma, left leg  05/1995    Probably 2nd to anticoag  . 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 Fractured left wrist  1990    with old right posterior rib fracture  . Syncope comp heart block  9/10- 09/30/2007    HOSP Perm Pacer placed, HTN, dementia, mildly elevated TSH  . Pacer placement (dr. Lovena Le)  09/27/2007  .  Ct angio chest  09/27/07    No PE, No acute prob  . Femur im nail Left 10/21/2012    Procedure: INTRAMEDULLARY (IM) NAIL FEMORAL;  Surgeon: Meredith Pel, MD;  Location: [facility];  Service: Orthopedics;  Laterality: Left;   Social History:   reports that he has never smoked. He does not have any smokeless tobacco history on file. He reports that he does not drink alcohol or use illicit drugs.  Family History  Problem Relation Age of Onset  . Heart disease Father     Hardening of the arteries, heart trouble    Medications: Patient's Medications  New Prescriptions   No medications on file  Previous Medications   BISACODYL (DULCOLAX) 10 MG SUPPOSITORY    Place 10 mg  rectally as needed for moderate constipation.   CALCIUM CITRATE 200 MG TABS    Take 1 tablet by mouth daily.   DOCUSATE SODIUM 100 MG CAPS    Take 100 mg by mouth 2 (two) times daily as needed for constipation.   DONEPEZIL (ARICEPT) 5 MG TABLET    Take 10 mg by mouth at bedtime.    FINASTERIDE (PROSCAR) 5 MG TABLET    Take 1 tablet (5 mg total) by mouth daily.   HYDROCHLOROTHIAZIDE (MICROZIDE) 12.5 MG CAPSULE    Take 12.5 mg by mouth every morning.   HYDROCODONE-ACETAMINOPHEN (NORCO) 5-325 MG PER TABLET    Take 1-2 tablets by mouth every 6 (six) hours as needed for pain.   UNABLE TO FIND    2 (two) times daily. Magic Cup   VITAMIN D, ERGOCALCIFEROL, (DRISDOL) 50000 UNITS CAPS CAPSULE    Take 50,000 Units by mouth every 7 (seven) days. On saturdays  Modified Medications   No medications on file  Discontinued Medications   No medications on file     Physical Exam: Filed Vitals:   01/28/13 1600  BP: 111/69  Pulse: 76  Temp: 97.6 F (36.4 C)  Resp: 20  SpO2: 90%    Physical Exam  Constitutional: He is well-developed, well-nourished, and in no distress. No distress.  HENT:  Mouth/Throat: Oropharynx is clear and moist. No oropharyngeal exudate.  Eyes: Conjunctivae and EOM are normal. Pupils are equal, round, and reactive to light.  Neck: Normal range of motion. Neck supple.  Cardiovascular: Normal rate, regular rhythm and normal heart sounds.   Pulmonary/Chest: Effort normal and breath sounds normal. No respiratory distress.  Abdominal: Soft. Bowel sounds are normal. He exhibits no distension.  Musculoskeletal: He exhibits no edema and no tenderness.  Generalized weakness   Neurological: He is alert.  Skin: Skin is warm and dry. He is not diaphoretic.  unstageable wound on left heel  Psychiatric: Affect normal. He exhibits disordered thought content and abnormal recent memory.     Labs reviewed: Basic Metabolic Panel:  Recent Labs  10/20/12 1840 10/21/12 0355  10/22/12 0349 10/23/12 0416 01/07/13 0045  NA 137 136 136 135 137  K 4.0 4.6 4.4 4.0 4.0  CL 100 101 99 98 98  CO2 27 25 26 29 29   GLUCOSE 116* 115* 114* 114* 132*  BUN 21 19 21 23 22   CREATININE 0.90 0.84 1.09 0.98 0.88  CALCIUM 9.8 9.2 9.1 8.8 9.2  MG  --  1.9  --   --   --   PHOS  --  3.5  --   --   --    Liver Function Tests:  Recent Labs  10/21/12 0355  AST 33  ALT 14  ALKPHOS 81  BILITOT 0.9  PROT 6.6  ALBUMIN 3.1*   No results found for this basename: LIPASE, AMYLASE,  in the last 8760 hours No results found for this basename: AMMONIA,  in the last 8760 hours CBC:  Recent Labs  10/20/12 1840  10/22/12 0349 10/23/12 0416 01/07/13 0045  WBC 18.7*  < > 18.1* 11.4* 18.9*  NEUTROABS 14.6*  --   --   --  13.4*  HGB 14.4  < > 13.3 11.9* 14.0  HCT 42.2  < > 39.5 35.2* 42.3  MCV 93.6  < > 94.7 94.4 92.2  PLT 311  < > 289 244 403*  < > = values in this interval not displayed. Cardiac Enzymes:  Recent Labs  10/20/12 1840 01/07/13 0045  TROPONINI <0.30 <0.30    Assessment/Plan 1. Ulcer of left heel -ongoing, will need [facility] nurse to cont to follow and wound care follow up with PCP  2. Closed left hip fracture -stable; will need PT/OT at home  3. ESSENTIAL HYPERTENSION -conts on HCTZ  4. BPH -stable; conts on Proscar  5. Vit D Def -has been started on vit d 50,000 units weekly; will need to follow up with PCP   6. Dementia without behaviors -will need supervision at home -going home with niece -aricept 10 mg qhs started while at [facility]; will need ongoing follow up by PCP  5. HCAP (healthcare-associated pneumonia) -conts on levaquin; Rx written for 7 more days after discharge (leaving [facility] on 01/30/13 -duonebs q 6 hours for 1 week -florastor BID -cont on o2 for sats less than 90% -will get [facility] to cont to monitor  pt is stable for discharge home with 24 hour care; -will need PT/OT/Nursing per home health. DME for nebs. Rx written.  will need  to follow up with PCP within 2 weeks.

## 2013-02-01 DIAGNOSIS — J189 Pneumonia, unspecified organism: Secondary | ICD-10-CM | POA: Diagnosis not present

## 2013-02-01 DIAGNOSIS — L8995 Pressure ulcer of unspecified site, unstageable: Secondary | ICD-10-CM | POA: Diagnosis not present

## 2013-02-01 DIAGNOSIS — R269 Unspecified abnormalities of gait and mobility: Secondary | ICD-10-CM | POA: Diagnosis not present

## 2013-02-01 DIAGNOSIS — S72009D Fracture of unspecified part of neck of unspecified femur, subsequent encounter for closed fracture with routine healing: Secondary | ICD-10-CM | POA: Diagnosis not present

## 2013-02-01 DIAGNOSIS — L89609 Pressure ulcer of unspecified heel, unspecified stage: Secondary | ICD-10-CM | POA: Diagnosis not present

## 2013-02-01 DIAGNOSIS — F0390 Unspecified dementia without behavioral disturbance: Secondary | ICD-10-CM | POA: Diagnosis not present

## 2013-02-03 ENCOUNTER — Telehealth: Payer: Self-pay | Admitting: *Deleted

## 2013-02-03 DIAGNOSIS — J189 Pneumonia, unspecified organism: Secondary | ICD-10-CM | POA: Diagnosis not present

## 2013-02-03 DIAGNOSIS — L89609 Pressure ulcer of unspecified heel, unspecified stage: Secondary | ICD-10-CM | POA: Diagnosis not present

## 2013-02-03 DIAGNOSIS — R269 Unspecified abnormalities of gait and mobility: Secondary | ICD-10-CM | POA: Diagnosis not present

## 2013-02-03 DIAGNOSIS — F0390 Unspecified dementia without behavioral disturbance: Secondary | ICD-10-CM | POA: Diagnosis not present

## 2013-02-03 DIAGNOSIS — L8995 Pressure ulcer of unspecified site, unstageable: Secondary | ICD-10-CM | POA: Diagnosis not present

## 2013-02-03 DIAGNOSIS — S72009D Fracture of unspecified part of neck of unspecified femur, subsequent encounter for closed fracture with routine healing: Secondary | ICD-10-CM | POA: Diagnosis not present

## 2013-02-03 NOTE — Telephone Encounter (Signed)
Elmyra Ricks opened the patient's case on Saturday for [facility] and would like to get an order for Calcium Ox_?_, foam, Kerlex to left heel: 3 x week, 2 x week for 4 weeks, 1 x week for 4 weeks and HH aide 2 x week.  Please advise.

## 2013-02-03 NOTE — Telephone Encounter (Signed)
Please give the verbal order.  Thanks.

## 2013-02-04 ENCOUNTER — Telehealth: Payer: Self-pay

## 2013-02-04 DIAGNOSIS — L8995 Pressure ulcer of unspecified site, unstageable: Secondary | ICD-10-CM | POA: Diagnosis not present

## 2013-02-04 DIAGNOSIS — F0390 Unspecified dementia without behavioral disturbance: Secondary | ICD-10-CM | POA: Diagnosis not present

## 2013-02-04 DIAGNOSIS — S72009D Fracture of unspecified part of neck of unspecified femur, subsequent encounter for closed fracture with routine healing: Secondary | ICD-10-CM | POA: Diagnosis not present

## 2013-02-04 DIAGNOSIS — R269 Unspecified abnormalities of gait and mobility: Secondary | ICD-10-CM | POA: Diagnosis not present

## 2013-02-04 DIAGNOSIS — J189 Pneumonia, unspecified organism: Secondary | ICD-10-CM | POA: Diagnosis not present

## 2013-02-04 DIAGNOSIS — L89609 Pressure ulcer of unspecified heel, unspecified stage: Secondary | ICD-10-CM | POA: Diagnosis not present

## 2013-02-04 NOTE — Telephone Encounter (Signed)
Please give the order.  Thanks.   

## 2013-02-04 NOTE — Telephone Encounter (Signed)
Judeen Hammans, PT advised.

## 2013-02-04 NOTE — Telephone Encounter (Signed)
Elmyra Ricks advised.

## 2013-02-04 NOTE — Telephone Encounter (Signed)
Judeen Hammans PT with [facility] left v/m requesting verbal order for home health PT 3 x a week for 2 weeks and 2 x a week for 3 weeks.Please advise.

## 2013-02-05 DIAGNOSIS — R269 Unspecified abnormalities of gait and mobility: Secondary | ICD-10-CM | POA: Diagnosis not present

## 2013-02-05 DIAGNOSIS — L89609 Pressure ulcer of unspecified heel, unspecified stage: Secondary | ICD-10-CM | POA: Diagnosis not present

## 2013-02-05 DIAGNOSIS — F0390 Unspecified dementia without behavioral disturbance: Secondary | ICD-10-CM | POA: Diagnosis not present

## 2013-02-05 DIAGNOSIS — S72009D Fracture of unspecified part of neck of unspecified femur, subsequent encounter for closed fracture with routine healing: Secondary | ICD-10-CM | POA: Diagnosis not present

## 2013-02-05 DIAGNOSIS — J189 Pneumonia, unspecified organism: Secondary | ICD-10-CM | POA: Diagnosis not present

## 2013-02-05 DIAGNOSIS — L8995 Pressure ulcer of unspecified site, unstageable: Secondary | ICD-10-CM | POA: Diagnosis not present

## 2013-02-07 DIAGNOSIS — J189 Pneumonia, unspecified organism: Secondary | ICD-10-CM | POA: Diagnosis not present

## 2013-02-07 DIAGNOSIS — S72009D Fracture of unspecified part of neck of unspecified femur, subsequent encounter for closed fracture with routine healing: Secondary | ICD-10-CM | POA: Diagnosis not present

## 2013-02-07 DIAGNOSIS — F0390 Unspecified dementia without behavioral disturbance: Secondary | ICD-10-CM | POA: Diagnosis not present

## 2013-02-07 DIAGNOSIS — L8995 Pressure ulcer of unspecified site, unstageable: Secondary | ICD-10-CM | POA: Diagnosis not present

## 2013-02-07 DIAGNOSIS — L89609 Pressure ulcer of unspecified heel, unspecified stage: Secondary | ICD-10-CM | POA: Diagnosis not present

## 2013-02-07 DIAGNOSIS — R269 Unspecified abnormalities of gait and mobility: Secondary | ICD-10-CM | POA: Diagnosis not present

## 2013-02-10 DIAGNOSIS — L8995 Pressure ulcer of unspecified site, unstageable: Secondary | ICD-10-CM | POA: Diagnosis not present

## 2013-02-10 DIAGNOSIS — L89609 Pressure ulcer of unspecified heel, unspecified stage: Secondary | ICD-10-CM | POA: Diagnosis not present

## 2013-02-10 DIAGNOSIS — F0390 Unspecified dementia without behavioral disturbance: Secondary | ICD-10-CM | POA: Diagnosis not present

## 2013-02-10 DIAGNOSIS — S72009D Fracture of unspecified part of neck of unspecified femur, subsequent encounter for closed fracture with routine healing: Secondary | ICD-10-CM | POA: Diagnosis not present

## 2013-02-10 DIAGNOSIS — R269 Unspecified abnormalities of gait and mobility: Secondary | ICD-10-CM | POA: Diagnosis not present

## 2013-02-10 DIAGNOSIS — J189 Pneumonia, unspecified organism: Secondary | ICD-10-CM | POA: Diagnosis not present

## 2013-02-12 DIAGNOSIS — L8995 Pressure ulcer of unspecified site, unstageable: Secondary | ICD-10-CM | POA: Diagnosis not present

## 2013-02-12 DIAGNOSIS — F0390 Unspecified dementia without behavioral disturbance: Secondary | ICD-10-CM | POA: Diagnosis not present

## 2013-02-12 DIAGNOSIS — L89609 Pressure ulcer of unspecified heel, unspecified stage: Secondary | ICD-10-CM | POA: Diagnosis not present

## 2013-02-12 DIAGNOSIS — J189 Pneumonia, unspecified organism: Secondary | ICD-10-CM | POA: Diagnosis not present

## 2013-02-12 DIAGNOSIS — S72009D Fracture of unspecified part of neck of unspecified femur, subsequent encounter for closed fracture with routine healing: Secondary | ICD-10-CM | POA: Diagnosis not present

## 2013-02-12 DIAGNOSIS — R269 Unspecified abnormalities of gait and mobility: Secondary | ICD-10-CM | POA: Diagnosis not present

## 2013-02-13 ENCOUNTER — Ambulatory Visit (INDEPENDENT_AMBULATORY_CARE_PROVIDER_SITE_OTHER): Payer: Medicare Other | Admitting: Family Medicine

## 2013-02-13 ENCOUNTER — Encounter: Payer: Self-pay | Admitting: Family Medicine

## 2013-02-13 VITALS — BP 102/52 | HR 84 | Temp 98.2°F | Wt 167.5 lb

## 2013-02-13 DIAGNOSIS — L97429 Non-pressure chronic ulcer of left heel and midfoot with unspecified severity: Secondary | ICD-10-CM

## 2013-02-13 DIAGNOSIS — S72009A Fracture of unspecified part of neck of unspecified femur, initial encounter for closed fracture: Secondary | ICD-10-CM

## 2013-02-13 DIAGNOSIS — L97409 Non-pressure chronic ulcer of unspecified heel and midfoot with unspecified severity: Secondary | ICD-10-CM

## 2013-02-13 DIAGNOSIS — S72009D Fracture of unspecified part of neck of unspecified femur, subsequent encounter for closed fracture with routine healing: Secondary | ICD-10-CM | POA: Diagnosis not present

## 2013-02-13 DIAGNOSIS — L98499 Non-pressure chronic ulcer of skin of other sites with unspecified severity: Secondary | ICD-10-CM | POA: Diagnosis not present

## 2013-02-13 DIAGNOSIS — J189 Pneumonia, unspecified organism: Secondary | ICD-10-CM | POA: Diagnosis not present

## 2013-02-13 DIAGNOSIS — F0390 Unspecified dementia without behavioral disturbance: Secondary | ICD-10-CM | POA: Diagnosis not present

## 2013-02-13 DIAGNOSIS — E559 Vitamin D deficiency, unspecified: Secondary | ICD-10-CM

## 2013-02-13 DIAGNOSIS — S72002A Fracture of unspecified part of neck of left femur, initial encounter for closed fracture: Secondary | ICD-10-CM

## 2013-02-13 DIAGNOSIS — R269 Unspecified abnormalities of gait and mobility: Secondary | ICD-10-CM | POA: Diagnosis not present

## 2013-02-13 DIAGNOSIS — L8995 Pressure ulcer of unspecified site, unstageable: Secondary | ICD-10-CM | POA: Diagnosis not present

## 2013-02-13 DIAGNOSIS — L89609 Pressure ulcer of unspecified heel, unspecified stage: Secondary | ICD-10-CM | POA: Diagnosis not present

## 2013-02-13 NOTE — Assessment & Plan Note (Signed)
On replacement

## 2013-02-13 NOTE — Progress Notes (Signed)
Pre-visit discussion using our clinic review tool. No additional management support is needed unless otherwise documented below in the visit note.  Admitted for hip fx, pinned. Then with UTI, PNA, heel ulcer, low vit D.  Placed at SNF, now back home with sig family help and HH. No fevers.  Swallowing well. Appetite is good. Has wound care at home, ulcer is healing. Not SOB, no CP. No BLE edema. Memory improved back home with infections treated, family stopped aricept. Here for follow up today. No pain.  PMH and SH reviewed  ROS: See HPI, otherwise noncontributory.  Meds, vitals, and allergies reviewed.   nad Speech wnl, HOH Mmm rrr Ctab, no inc in wob, no focal dec in BS abd soft, not ttp Ext w/o edema L ankle with 2 cm clean based ulcer near the achilles insertion

## 2013-02-13 NOTE — Patient Instructions (Signed)
Keep the pressure off your heel (pillow under the calf when in bed).  Take care.  Glad to see you.  Update me as needed.

## 2013-02-13 NOTE — Assessment & Plan Note (Signed)
Has HH RN and PT. Gait improved recently per family.

## 2013-02-13 NOTE — Assessment & Plan Note (Signed)
Much improved, mentation improved in meantime. Continue as is off abx. ctab today.

## 2013-02-13 NOTE — Assessment & Plan Note (Signed)
Improved, float the heel in bed. Continue as is with wound care.

## 2013-02-14 DIAGNOSIS — S72009D Fracture of unspecified part of neck of unspecified femur, subsequent encounter for closed fracture with routine healing: Secondary | ICD-10-CM | POA: Diagnosis not present

## 2013-02-14 DIAGNOSIS — L8995 Pressure ulcer of unspecified site, unstageable: Secondary | ICD-10-CM | POA: Diagnosis not present

## 2013-02-14 DIAGNOSIS — L89609 Pressure ulcer of unspecified heel, unspecified stage: Secondary | ICD-10-CM | POA: Diagnosis not present

## 2013-02-14 DIAGNOSIS — F0390 Unspecified dementia without behavioral disturbance: Secondary | ICD-10-CM | POA: Diagnosis not present

## 2013-02-14 DIAGNOSIS — R269 Unspecified abnormalities of gait and mobility: Secondary | ICD-10-CM | POA: Diagnosis not present

## 2013-02-14 DIAGNOSIS — J189 Pneumonia, unspecified organism: Secondary | ICD-10-CM | POA: Diagnosis not present

## 2013-02-17 ENCOUNTER — Telehealth: Payer: Self-pay | Admitting: *Deleted

## 2013-02-17 DIAGNOSIS — L89609 Pressure ulcer of unspecified heel, unspecified stage: Secondary | ICD-10-CM | POA: Diagnosis not present

## 2013-02-17 DIAGNOSIS — J189 Pneumonia, unspecified organism: Secondary | ICD-10-CM | POA: Diagnosis not present

## 2013-02-17 DIAGNOSIS — R269 Unspecified abnormalities of gait and mobility: Secondary | ICD-10-CM | POA: Diagnosis not present

## 2013-02-17 DIAGNOSIS — S72009D Fracture of unspecified part of neck of unspecified femur, subsequent encounter for closed fracture with routine healing: Secondary | ICD-10-CM | POA: Diagnosis not present

## 2013-02-17 DIAGNOSIS — F0390 Unspecified dementia without behavioral disturbance: Secondary | ICD-10-CM | POA: Diagnosis not present

## 2013-02-17 DIAGNOSIS — L8995 Pressure ulcer of unspecified site, unstageable: Secondary | ICD-10-CM | POA: Diagnosis not present

## 2013-02-17 NOTE — Telephone Encounter (Signed)
Left message on voice mail  to call back

## 2013-02-17 NOTE — Telephone Encounter (Signed)
Calling asking for OT for 2 times a week for 4 weeks

## 2013-02-17 NOTE — Telephone Encounter (Signed)
Verbal order given by telephone to [facility] as instructed.

## 2013-02-17 NOTE — Telephone Encounter (Signed)
Please give the order.  Thanks.   

## 2013-02-18 DIAGNOSIS — S72009D Fracture of unspecified part of neck of unspecified femur, subsequent encounter for closed fracture with routine healing: Secondary | ICD-10-CM | POA: Diagnosis not present

## 2013-02-18 DIAGNOSIS — J189 Pneumonia, unspecified organism: Secondary | ICD-10-CM | POA: Diagnosis not present

## 2013-02-18 DIAGNOSIS — L89609 Pressure ulcer of unspecified heel, unspecified stage: Secondary | ICD-10-CM | POA: Diagnosis not present

## 2013-02-18 DIAGNOSIS — L8995 Pressure ulcer of unspecified site, unstageable: Secondary | ICD-10-CM | POA: Diagnosis not present

## 2013-02-18 DIAGNOSIS — F0390 Unspecified dementia without behavioral disturbance: Secondary | ICD-10-CM | POA: Diagnosis not present

## 2013-02-18 DIAGNOSIS — R269 Unspecified abnormalities of gait and mobility: Secondary | ICD-10-CM | POA: Diagnosis not present

## 2013-02-19 DIAGNOSIS — L8995 Pressure ulcer of unspecified site, unstageable: Secondary | ICD-10-CM | POA: Diagnosis not present

## 2013-02-19 DIAGNOSIS — S72009D Fracture of unspecified part of neck of unspecified femur, subsequent encounter for closed fracture with routine healing: Secondary | ICD-10-CM | POA: Diagnosis not present

## 2013-02-19 DIAGNOSIS — J189 Pneumonia, unspecified organism: Secondary | ICD-10-CM | POA: Diagnosis not present

## 2013-02-19 DIAGNOSIS — F0390 Unspecified dementia without behavioral disturbance: Secondary | ICD-10-CM | POA: Diagnosis not present

## 2013-02-19 DIAGNOSIS — R269 Unspecified abnormalities of gait and mobility: Secondary | ICD-10-CM | POA: Diagnosis not present

## 2013-02-19 DIAGNOSIS — L89609 Pressure ulcer of unspecified heel, unspecified stage: Secondary | ICD-10-CM | POA: Diagnosis not present

## 2013-02-20 DIAGNOSIS — F0390 Unspecified dementia without behavioral disturbance: Secondary | ICD-10-CM | POA: Diagnosis not present

## 2013-02-20 DIAGNOSIS — J189 Pneumonia, unspecified organism: Secondary | ICD-10-CM | POA: Diagnosis not present

## 2013-02-20 DIAGNOSIS — R269 Unspecified abnormalities of gait and mobility: Secondary | ICD-10-CM | POA: Diagnosis not present

## 2013-02-20 DIAGNOSIS — S72009D Fracture of unspecified part of neck of unspecified femur, subsequent encounter for closed fracture with routine healing: Secondary | ICD-10-CM | POA: Diagnosis not present

## 2013-02-20 DIAGNOSIS — L8995 Pressure ulcer of unspecified site, unstageable: Secondary | ICD-10-CM | POA: Diagnosis not present

## 2013-02-20 DIAGNOSIS — L89609 Pressure ulcer of unspecified heel, unspecified stage: Secondary | ICD-10-CM | POA: Diagnosis not present

## 2013-02-21 DIAGNOSIS — J189 Pneumonia, unspecified organism: Secondary | ICD-10-CM | POA: Diagnosis not present

## 2013-02-21 DIAGNOSIS — L89609 Pressure ulcer of unspecified heel, unspecified stage: Secondary | ICD-10-CM | POA: Diagnosis not present

## 2013-02-21 DIAGNOSIS — R269 Unspecified abnormalities of gait and mobility: Secondary | ICD-10-CM | POA: Diagnosis not present

## 2013-02-21 DIAGNOSIS — F0390 Unspecified dementia without behavioral disturbance: Secondary | ICD-10-CM | POA: Diagnosis not present

## 2013-02-21 DIAGNOSIS — S72009D Fracture of unspecified part of neck of unspecified femur, subsequent encounter for closed fracture with routine healing: Secondary | ICD-10-CM | POA: Diagnosis not present

## 2013-02-21 DIAGNOSIS — L8995 Pressure ulcer of unspecified site, unstageable: Secondary | ICD-10-CM | POA: Diagnosis not present

## 2013-02-24 DIAGNOSIS — S72009D Fracture of unspecified part of neck of unspecified femur, subsequent encounter for closed fracture with routine healing: Secondary | ICD-10-CM | POA: Diagnosis not present

## 2013-02-24 DIAGNOSIS — R269 Unspecified abnormalities of gait and mobility: Secondary | ICD-10-CM | POA: Diagnosis not present

## 2013-02-24 DIAGNOSIS — L8995 Pressure ulcer of unspecified site, unstageable: Secondary | ICD-10-CM | POA: Diagnosis not present

## 2013-02-24 DIAGNOSIS — J189 Pneumonia, unspecified organism: Secondary | ICD-10-CM | POA: Diagnosis not present

## 2013-02-24 DIAGNOSIS — L89609 Pressure ulcer of unspecified heel, unspecified stage: Secondary | ICD-10-CM | POA: Diagnosis not present

## 2013-02-24 DIAGNOSIS — F0390 Unspecified dementia without behavioral disturbance: Secondary | ICD-10-CM | POA: Diagnosis not present

## 2013-02-25 DIAGNOSIS — R269 Unspecified abnormalities of gait and mobility: Secondary | ICD-10-CM | POA: Diagnosis not present

## 2013-02-25 DIAGNOSIS — J189 Pneumonia, unspecified organism: Secondary | ICD-10-CM | POA: Diagnosis not present

## 2013-02-25 DIAGNOSIS — L8995 Pressure ulcer of unspecified site, unstageable: Secondary | ICD-10-CM | POA: Diagnosis not present

## 2013-02-25 DIAGNOSIS — L89609 Pressure ulcer of unspecified heel, unspecified stage: Secondary | ICD-10-CM | POA: Diagnosis not present

## 2013-02-25 DIAGNOSIS — S72009D Fracture of unspecified part of neck of unspecified femur, subsequent encounter for closed fracture with routine healing: Secondary | ICD-10-CM | POA: Diagnosis not present

## 2013-02-25 DIAGNOSIS — F0390 Unspecified dementia without behavioral disturbance: Secondary | ICD-10-CM | POA: Diagnosis not present

## 2013-02-26 DIAGNOSIS — J189 Pneumonia, unspecified organism: Secondary | ICD-10-CM | POA: Diagnosis not present

## 2013-02-26 DIAGNOSIS — R269 Unspecified abnormalities of gait and mobility: Secondary | ICD-10-CM | POA: Diagnosis not present

## 2013-02-26 DIAGNOSIS — F0390 Unspecified dementia without behavioral disturbance: Secondary | ICD-10-CM | POA: Diagnosis not present

## 2013-02-26 DIAGNOSIS — L89609 Pressure ulcer of unspecified heel, unspecified stage: Secondary | ICD-10-CM | POA: Diagnosis not present

## 2013-02-26 DIAGNOSIS — L8995 Pressure ulcer of unspecified site, unstageable: Secondary | ICD-10-CM | POA: Diagnosis not present

## 2013-02-26 DIAGNOSIS — S72009D Fracture of unspecified part of neck of unspecified femur, subsequent encounter for closed fracture with routine healing: Secondary | ICD-10-CM | POA: Diagnosis not present

## 2013-02-27 ENCOUNTER — Telehealth: Payer: Self-pay

## 2013-02-27 DIAGNOSIS — S72009D Fracture of unspecified part of neck of unspecified femur, subsequent encounter for closed fracture with routine healing: Secondary | ICD-10-CM | POA: Diagnosis not present

## 2013-02-27 DIAGNOSIS — R269 Unspecified abnormalities of gait and mobility: Secondary | ICD-10-CM | POA: Diagnosis not present

## 2013-02-27 DIAGNOSIS — L89609 Pressure ulcer of unspecified heel, unspecified stage: Secondary | ICD-10-CM | POA: Diagnosis not present

## 2013-02-27 DIAGNOSIS — L8995 Pressure ulcer of unspecified site, unstageable: Secondary | ICD-10-CM | POA: Diagnosis not present

## 2013-02-27 DIAGNOSIS — J189 Pneumonia, unspecified organism: Secondary | ICD-10-CM | POA: Diagnosis not present

## 2013-02-27 DIAGNOSIS — F0390 Unspecified dementia without behavioral disturbance: Secondary | ICD-10-CM | POA: Diagnosis not present

## 2013-02-27 NOTE — Telephone Encounter (Signed)
Seth Hunter with [facility] left v/m; Seth Hunter just saw pt this AM and pt has redness behind ears and request order to apply triple antibiotic ointment for 7 days and change order for dressing order for foot to borderfoam instead of Kerlix. Seth Hunter request cb.

## 2013-02-27 NOTE — Telephone Encounter (Signed)
Elmyra Ricks advised.

## 2013-02-27 NOTE — Telephone Encounter (Signed)
Please give the order.  Thanks.   

## 2013-02-28 DIAGNOSIS — L89609 Pressure ulcer of unspecified heel, unspecified stage: Secondary | ICD-10-CM | POA: Diagnosis not present

## 2013-02-28 DIAGNOSIS — R269 Unspecified abnormalities of gait and mobility: Secondary | ICD-10-CM | POA: Diagnosis not present

## 2013-02-28 DIAGNOSIS — L8995 Pressure ulcer of unspecified site, unstageable: Secondary | ICD-10-CM | POA: Diagnosis not present

## 2013-02-28 DIAGNOSIS — F0390 Unspecified dementia without behavioral disturbance: Secondary | ICD-10-CM | POA: Diagnosis not present

## 2013-02-28 DIAGNOSIS — J189 Pneumonia, unspecified organism: Secondary | ICD-10-CM | POA: Diagnosis not present

## 2013-02-28 DIAGNOSIS — S72009D Fracture of unspecified part of neck of unspecified femur, subsequent encounter for closed fracture with routine healing: Secondary | ICD-10-CM | POA: Diagnosis not present

## 2013-03-03 DIAGNOSIS — F0390 Unspecified dementia without behavioral disturbance: Secondary | ICD-10-CM | POA: Diagnosis not present

## 2013-03-03 DIAGNOSIS — S72009D Fracture of unspecified part of neck of unspecified femur, subsequent encounter for closed fracture with routine healing: Secondary | ICD-10-CM | POA: Diagnosis not present

## 2013-03-03 DIAGNOSIS — R269 Unspecified abnormalities of gait and mobility: Secondary | ICD-10-CM | POA: Diagnosis not present

## 2013-03-03 DIAGNOSIS — L89609 Pressure ulcer of unspecified heel, unspecified stage: Secondary | ICD-10-CM | POA: Diagnosis not present

## 2013-03-03 DIAGNOSIS — J189 Pneumonia, unspecified organism: Secondary | ICD-10-CM | POA: Diagnosis not present

## 2013-03-03 DIAGNOSIS — L8995 Pressure ulcer of unspecified site, unstageable: Secondary | ICD-10-CM | POA: Diagnosis not present

## 2013-03-05 DIAGNOSIS — L8995 Pressure ulcer of unspecified site, unstageable: Secondary | ICD-10-CM | POA: Diagnosis not present

## 2013-03-05 DIAGNOSIS — F0390 Unspecified dementia without behavioral disturbance: Secondary | ICD-10-CM | POA: Diagnosis not present

## 2013-03-05 DIAGNOSIS — J189 Pneumonia, unspecified organism: Secondary | ICD-10-CM | POA: Diagnosis not present

## 2013-03-05 DIAGNOSIS — L89609 Pressure ulcer of unspecified heel, unspecified stage: Secondary | ICD-10-CM | POA: Diagnosis not present

## 2013-03-05 DIAGNOSIS — R269 Unspecified abnormalities of gait and mobility: Secondary | ICD-10-CM | POA: Diagnosis not present

## 2013-03-05 DIAGNOSIS — S72009D Fracture of unspecified part of neck of unspecified femur, subsequent encounter for closed fracture with routine healing: Secondary | ICD-10-CM | POA: Diagnosis not present

## 2013-03-06 DIAGNOSIS — S72009D Fracture of unspecified part of neck of unspecified femur, subsequent encounter for closed fracture with routine healing: Secondary | ICD-10-CM | POA: Diagnosis not present

## 2013-03-06 DIAGNOSIS — R269 Unspecified abnormalities of gait and mobility: Secondary | ICD-10-CM | POA: Diagnosis not present

## 2013-03-06 DIAGNOSIS — L89609 Pressure ulcer of unspecified heel, unspecified stage: Secondary | ICD-10-CM | POA: Diagnosis not present

## 2013-03-06 DIAGNOSIS — L8995 Pressure ulcer of unspecified site, unstageable: Secondary | ICD-10-CM | POA: Diagnosis not present

## 2013-03-06 DIAGNOSIS — J189 Pneumonia, unspecified organism: Secondary | ICD-10-CM | POA: Diagnosis not present

## 2013-03-06 DIAGNOSIS — F0390 Unspecified dementia without behavioral disturbance: Secondary | ICD-10-CM | POA: Diagnosis not present

## 2013-03-07 DIAGNOSIS — J189 Pneumonia, unspecified organism: Secondary | ICD-10-CM | POA: Diagnosis not present

## 2013-03-07 DIAGNOSIS — F0390 Unspecified dementia without behavioral disturbance: Secondary | ICD-10-CM | POA: Diagnosis not present

## 2013-03-07 DIAGNOSIS — L89609 Pressure ulcer of unspecified heel, unspecified stage: Secondary | ICD-10-CM | POA: Diagnosis not present

## 2013-03-07 DIAGNOSIS — L8995 Pressure ulcer of unspecified site, unstageable: Secondary | ICD-10-CM | POA: Diagnosis not present

## 2013-03-07 DIAGNOSIS — S72009D Fracture of unspecified part of neck of unspecified femur, subsequent encounter for closed fracture with routine healing: Secondary | ICD-10-CM | POA: Diagnosis not present

## 2013-03-07 DIAGNOSIS — R269 Unspecified abnormalities of gait and mobility: Secondary | ICD-10-CM | POA: Diagnosis not present

## 2013-03-10 DIAGNOSIS — F0390 Unspecified dementia without behavioral disturbance: Secondary | ICD-10-CM | POA: Diagnosis not present

## 2013-03-10 DIAGNOSIS — L8995 Pressure ulcer of unspecified site, unstageable: Secondary | ICD-10-CM | POA: Diagnosis not present

## 2013-03-10 DIAGNOSIS — S72009D Fracture of unspecified part of neck of unspecified femur, subsequent encounter for closed fracture with routine healing: Secondary | ICD-10-CM | POA: Diagnosis not present

## 2013-03-10 DIAGNOSIS — J189 Pneumonia, unspecified organism: Secondary | ICD-10-CM | POA: Diagnosis not present

## 2013-03-10 DIAGNOSIS — L89609 Pressure ulcer of unspecified heel, unspecified stage: Secondary | ICD-10-CM | POA: Diagnosis not present

## 2013-03-10 DIAGNOSIS — R269 Unspecified abnormalities of gait and mobility: Secondary | ICD-10-CM | POA: Diagnosis not present

## 2013-03-12 DIAGNOSIS — L8995 Pressure ulcer of unspecified site, unstageable: Secondary | ICD-10-CM | POA: Diagnosis not present

## 2013-03-12 DIAGNOSIS — S72009D Fracture of unspecified part of neck of unspecified femur, subsequent encounter for closed fracture with routine healing: Secondary | ICD-10-CM | POA: Diagnosis not present

## 2013-03-12 DIAGNOSIS — F0390 Unspecified dementia without behavioral disturbance: Secondary | ICD-10-CM | POA: Diagnosis not present

## 2013-03-12 DIAGNOSIS — L89609 Pressure ulcer of unspecified heel, unspecified stage: Secondary | ICD-10-CM | POA: Diagnosis not present

## 2013-03-12 DIAGNOSIS — R269 Unspecified abnormalities of gait and mobility: Secondary | ICD-10-CM | POA: Diagnosis not present

## 2013-03-12 DIAGNOSIS — J189 Pneumonia, unspecified organism: Secondary | ICD-10-CM | POA: Diagnosis not present

## 2013-03-14 DIAGNOSIS — S72009D Fracture of unspecified part of neck of unspecified femur, subsequent encounter for closed fracture with routine healing: Secondary | ICD-10-CM | POA: Diagnosis not present

## 2013-03-14 DIAGNOSIS — F0390 Unspecified dementia without behavioral disturbance: Secondary | ICD-10-CM | POA: Diagnosis not present

## 2013-03-14 DIAGNOSIS — L8995 Pressure ulcer of unspecified site, unstageable: Secondary | ICD-10-CM | POA: Diagnosis not present

## 2013-03-14 DIAGNOSIS — L89609 Pressure ulcer of unspecified heel, unspecified stage: Secondary | ICD-10-CM | POA: Diagnosis not present

## 2013-03-14 DIAGNOSIS — R269 Unspecified abnormalities of gait and mobility: Secondary | ICD-10-CM | POA: Diagnosis not present

## 2013-03-14 DIAGNOSIS — J189 Pneumonia, unspecified organism: Secondary | ICD-10-CM | POA: Diagnosis not present

## 2013-03-17 ENCOUNTER — Telehealth: Payer: Self-pay

## 2013-03-17 DIAGNOSIS — L89609 Pressure ulcer of unspecified heel, unspecified stage: Secondary | ICD-10-CM | POA: Diagnosis not present

## 2013-03-17 DIAGNOSIS — L8995 Pressure ulcer of unspecified site, unstageable: Secondary | ICD-10-CM | POA: Diagnosis not present

## 2013-03-17 DIAGNOSIS — F0390 Unspecified dementia without behavioral disturbance: Secondary | ICD-10-CM | POA: Diagnosis not present

## 2013-03-17 DIAGNOSIS — S72009D Fracture of unspecified part of neck of unspecified femur, subsequent encounter for closed fracture with routine healing: Secondary | ICD-10-CM | POA: Diagnosis not present

## 2013-03-17 DIAGNOSIS — R269 Unspecified abnormalities of gait and mobility: Secondary | ICD-10-CM | POA: Diagnosis not present

## 2013-03-17 DIAGNOSIS — J189 Pneumonia, unspecified organism: Secondary | ICD-10-CM | POA: Diagnosis not present

## 2013-03-17 NOTE — Telephone Encounter (Signed)
Please give the verbal.  Thanks.

## 2013-03-17 NOTE — Telephone Encounter (Signed)
Physical therapist with [facility] left v/m requesting verbal order to continue PT home health 2 x a week for 2 more weeks. Can leave v/m with verbal order if no answer.

## 2013-03-18 NOTE — Telephone Encounter (Signed)
PT at [facility] advised.

## 2013-03-19 DIAGNOSIS — L89609 Pressure ulcer of unspecified heel, unspecified stage: Secondary | ICD-10-CM | POA: Diagnosis not present

## 2013-03-19 DIAGNOSIS — J189 Pneumonia, unspecified organism: Secondary | ICD-10-CM | POA: Diagnosis not present

## 2013-03-19 DIAGNOSIS — S72009D Fracture of unspecified part of neck of unspecified femur, subsequent encounter for closed fracture with routine healing: Secondary | ICD-10-CM | POA: Diagnosis not present

## 2013-03-19 DIAGNOSIS — R269 Unspecified abnormalities of gait and mobility: Secondary | ICD-10-CM | POA: Diagnosis not present

## 2013-03-19 DIAGNOSIS — L8995 Pressure ulcer of unspecified site, unstageable: Secondary | ICD-10-CM | POA: Diagnosis not present

## 2013-03-19 DIAGNOSIS — F0390 Unspecified dementia without behavioral disturbance: Secondary | ICD-10-CM | POA: Diagnosis not present

## 2013-03-20 DIAGNOSIS — L89609 Pressure ulcer of unspecified heel, unspecified stage: Secondary | ICD-10-CM | POA: Diagnosis not present

## 2013-03-20 DIAGNOSIS — R269 Unspecified abnormalities of gait and mobility: Secondary | ICD-10-CM | POA: Diagnosis not present

## 2013-03-20 DIAGNOSIS — F0390 Unspecified dementia without behavioral disturbance: Secondary | ICD-10-CM | POA: Diagnosis not present

## 2013-03-20 DIAGNOSIS — J189 Pneumonia, unspecified organism: Secondary | ICD-10-CM | POA: Diagnosis not present

## 2013-03-20 DIAGNOSIS — L8995 Pressure ulcer of unspecified site, unstageable: Secondary | ICD-10-CM | POA: Diagnosis not present

## 2013-03-20 DIAGNOSIS — S72009D Fracture of unspecified part of neck of unspecified femur, subsequent encounter for closed fracture with routine healing: Secondary | ICD-10-CM | POA: Diagnosis not present

## 2013-03-21 DIAGNOSIS — L8995 Pressure ulcer of unspecified site, unstageable: Secondary | ICD-10-CM | POA: Diagnosis not present

## 2013-03-21 DIAGNOSIS — S72009D Fracture of unspecified part of neck of unspecified femur, subsequent encounter for closed fracture with routine healing: Secondary | ICD-10-CM | POA: Diagnosis not present

## 2013-03-21 DIAGNOSIS — R269 Unspecified abnormalities of gait and mobility: Secondary | ICD-10-CM | POA: Diagnosis not present

## 2013-03-21 DIAGNOSIS — F0390 Unspecified dementia without behavioral disturbance: Secondary | ICD-10-CM | POA: Diagnosis not present

## 2013-03-21 DIAGNOSIS — L89609 Pressure ulcer of unspecified heel, unspecified stage: Secondary | ICD-10-CM | POA: Diagnosis not present

## 2013-03-21 DIAGNOSIS — J189 Pneumonia, unspecified organism: Secondary | ICD-10-CM | POA: Diagnosis not present

## 2013-03-24 DIAGNOSIS — L8995 Pressure ulcer of unspecified site, unstageable: Secondary | ICD-10-CM | POA: Diagnosis not present

## 2013-03-24 DIAGNOSIS — R269 Unspecified abnormalities of gait and mobility: Secondary | ICD-10-CM | POA: Diagnosis not present

## 2013-03-24 DIAGNOSIS — L89609 Pressure ulcer of unspecified heel, unspecified stage: Secondary | ICD-10-CM | POA: Diagnosis not present

## 2013-03-24 DIAGNOSIS — J189 Pneumonia, unspecified organism: Secondary | ICD-10-CM | POA: Diagnosis not present

## 2013-03-24 DIAGNOSIS — S72009D Fracture of unspecified part of neck of unspecified femur, subsequent encounter for closed fracture with routine healing: Secondary | ICD-10-CM | POA: Diagnosis not present

## 2013-03-24 DIAGNOSIS — F0390 Unspecified dementia without behavioral disturbance: Secondary | ICD-10-CM | POA: Diagnosis not present

## 2013-03-25 DIAGNOSIS — R269 Unspecified abnormalities of gait and mobility: Secondary | ICD-10-CM | POA: Diagnosis not present

## 2013-03-25 DIAGNOSIS — F0390 Unspecified dementia without behavioral disturbance: Secondary | ICD-10-CM | POA: Diagnosis not present

## 2013-03-25 DIAGNOSIS — S72009D Fracture of unspecified part of neck of unspecified femur, subsequent encounter for closed fracture with routine healing: Secondary | ICD-10-CM | POA: Diagnosis not present

## 2013-03-25 DIAGNOSIS — L89609 Pressure ulcer of unspecified heel, unspecified stage: Secondary | ICD-10-CM | POA: Diagnosis not present

## 2013-03-25 DIAGNOSIS — L8995 Pressure ulcer of unspecified site, unstageable: Secondary | ICD-10-CM | POA: Diagnosis not present

## 2013-03-25 DIAGNOSIS — J189 Pneumonia, unspecified organism: Secondary | ICD-10-CM | POA: Diagnosis not present

## 2013-03-27 DIAGNOSIS — L8995 Pressure ulcer of unspecified site, unstageable: Secondary | ICD-10-CM | POA: Diagnosis not present

## 2013-03-27 DIAGNOSIS — L89609 Pressure ulcer of unspecified heel, unspecified stage: Secondary | ICD-10-CM | POA: Diagnosis not present

## 2013-03-27 DIAGNOSIS — J189 Pneumonia, unspecified organism: Secondary | ICD-10-CM | POA: Diagnosis not present

## 2013-03-27 DIAGNOSIS — R269 Unspecified abnormalities of gait and mobility: Secondary | ICD-10-CM | POA: Diagnosis not present

## 2013-03-27 DIAGNOSIS — S72009D Fracture of unspecified part of neck of unspecified femur, subsequent encounter for closed fracture with routine healing: Secondary | ICD-10-CM | POA: Diagnosis not present

## 2013-03-27 DIAGNOSIS — F0390 Unspecified dementia without behavioral disturbance: Secondary | ICD-10-CM | POA: Diagnosis not present

## 2013-03-31 DIAGNOSIS — S72009D Fracture of unspecified part of neck of unspecified femur, subsequent encounter for closed fracture with routine healing: Secondary | ICD-10-CM | POA: Diagnosis not present

## 2013-03-31 DIAGNOSIS — F0390 Unspecified dementia without behavioral disturbance: Secondary | ICD-10-CM | POA: Diagnosis not present

## 2013-03-31 DIAGNOSIS — J189 Pneumonia, unspecified organism: Secondary | ICD-10-CM | POA: Diagnosis not present

## 2013-03-31 DIAGNOSIS — R269 Unspecified abnormalities of gait and mobility: Secondary | ICD-10-CM | POA: Diagnosis not present

## 2013-03-31 DIAGNOSIS — L89609 Pressure ulcer of unspecified heel, unspecified stage: Secondary | ICD-10-CM | POA: Diagnosis not present

## 2013-03-31 DIAGNOSIS — L8995 Pressure ulcer of unspecified site, unstageable: Secondary | ICD-10-CM | POA: Diagnosis not present

## 2013-04-02 DIAGNOSIS — F0390 Unspecified dementia without behavioral disturbance: Secondary | ICD-10-CM | POA: Diagnosis not present

## 2013-04-02 DIAGNOSIS — L89609 Pressure ulcer of unspecified heel, unspecified stage: Secondary | ICD-10-CM | POA: Diagnosis not present

## 2013-04-02 DIAGNOSIS — L8995 Pressure ulcer of unspecified site, unstageable: Secondary | ICD-10-CM | POA: Diagnosis not present

## 2013-04-02 DIAGNOSIS — R269 Unspecified abnormalities of gait and mobility: Secondary | ICD-10-CM | POA: Diagnosis not present

## 2013-04-03 DIAGNOSIS — R269 Unspecified abnormalities of gait and mobility: Secondary | ICD-10-CM | POA: Diagnosis not present

## 2013-04-03 DIAGNOSIS — L8995 Pressure ulcer of unspecified site, unstageable: Secondary | ICD-10-CM | POA: Diagnosis not present

## 2013-04-03 DIAGNOSIS — L89609 Pressure ulcer of unspecified heel, unspecified stage: Secondary | ICD-10-CM | POA: Diagnosis not present

## 2013-04-03 DIAGNOSIS — F0390 Unspecified dementia without behavioral disturbance: Secondary | ICD-10-CM | POA: Diagnosis not present

## 2013-04-04 DIAGNOSIS — L8995 Pressure ulcer of unspecified site, unstageable: Secondary | ICD-10-CM | POA: Diagnosis not present

## 2013-04-04 DIAGNOSIS — F0390 Unspecified dementia without behavioral disturbance: Secondary | ICD-10-CM | POA: Diagnosis not present

## 2013-04-04 DIAGNOSIS — L89609 Pressure ulcer of unspecified heel, unspecified stage: Secondary | ICD-10-CM | POA: Diagnosis not present

## 2013-04-04 DIAGNOSIS — R269 Unspecified abnormalities of gait and mobility: Secondary | ICD-10-CM | POA: Diagnosis not present

## 2013-04-07 DIAGNOSIS — L89609 Pressure ulcer of unspecified heel, unspecified stage: Secondary | ICD-10-CM | POA: Diagnosis not present

## 2013-04-07 DIAGNOSIS — R269 Unspecified abnormalities of gait and mobility: Secondary | ICD-10-CM | POA: Diagnosis not present

## 2013-04-07 DIAGNOSIS — F0390 Unspecified dementia without behavioral disturbance: Secondary | ICD-10-CM | POA: Diagnosis not present

## 2013-04-07 DIAGNOSIS — L8995 Pressure ulcer of unspecified site, unstageable: Secondary | ICD-10-CM | POA: Diagnosis not present

## 2013-04-10 DIAGNOSIS — L89609 Pressure ulcer of unspecified heel, unspecified stage: Secondary | ICD-10-CM | POA: Diagnosis not present

## 2013-04-10 DIAGNOSIS — F0390 Unspecified dementia without behavioral disturbance: Secondary | ICD-10-CM | POA: Diagnosis not present

## 2013-04-10 DIAGNOSIS — R269 Unspecified abnormalities of gait and mobility: Secondary | ICD-10-CM | POA: Diagnosis not present

## 2013-04-10 DIAGNOSIS — L8995 Pressure ulcer of unspecified site, unstageable: Secondary | ICD-10-CM | POA: Diagnosis not present

## 2013-04-14 DIAGNOSIS — L8995 Pressure ulcer of unspecified site, unstageable: Secondary | ICD-10-CM | POA: Diagnosis not present

## 2013-04-14 DIAGNOSIS — L89609 Pressure ulcer of unspecified heel, unspecified stage: Secondary | ICD-10-CM | POA: Diagnosis not present

## 2013-04-14 DIAGNOSIS — R269 Unspecified abnormalities of gait and mobility: Secondary | ICD-10-CM | POA: Diagnosis not present

## 2013-04-14 DIAGNOSIS — F0390 Unspecified dementia without behavioral disturbance: Secondary | ICD-10-CM | POA: Diagnosis not present

## 2013-04-17 DIAGNOSIS — F0390 Unspecified dementia without behavioral disturbance: Secondary | ICD-10-CM | POA: Diagnosis not present

## 2013-04-17 DIAGNOSIS — L8995 Pressure ulcer of unspecified site, unstageable: Secondary | ICD-10-CM | POA: Diagnosis not present

## 2013-04-17 DIAGNOSIS — R269 Unspecified abnormalities of gait and mobility: Secondary | ICD-10-CM | POA: Diagnosis not present

## 2013-04-17 DIAGNOSIS — L89609 Pressure ulcer of unspecified heel, unspecified stage: Secondary | ICD-10-CM | POA: Diagnosis not present

## 2013-04-18 DIAGNOSIS — F0390 Unspecified dementia without behavioral disturbance: Secondary | ICD-10-CM | POA: Diagnosis not present

## 2013-04-18 DIAGNOSIS — L89609 Pressure ulcer of unspecified heel, unspecified stage: Secondary | ICD-10-CM | POA: Diagnosis not present

## 2013-04-18 DIAGNOSIS — L8995 Pressure ulcer of unspecified site, unstageable: Secondary | ICD-10-CM | POA: Diagnosis not present

## 2013-04-18 DIAGNOSIS — R269 Unspecified abnormalities of gait and mobility: Secondary | ICD-10-CM | POA: Diagnosis not present

## 2013-04-21 DIAGNOSIS — R269 Unspecified abnormalities of gait and mobility: Secondary | ICD-10-CM | POA: Diagnosis not present

## 2013-04-21 DIAGNOSIS — L89609 Pressure ulcer of unspecified heel, unspecified stage: Secondary | ICD-10-CM | POA: Diagnosis not present

## 2013-04-21 DIAGNOSIS — F0390 Unspecified dementia without behavioral disturbance: Secondary | ICD-10-CM | POA: Diagnosis not present

## 2013-04-21 DIAGNOSIS — L8995 Pressure ulcer of unspecified site, unstageable: Secondary | ICD-10-CM | POA: Diagnosis not present

## 2013-04-22 DIAGNOSIS — L8995 Pressure ulcer of unspecified site, unstageable: Secondary | ICD-10-CM | POA: Diagnosis not present

## 2013-04-22 DIAGNOSIS — L89609 Pressure ulcer of unspecified heel, unspecified stage: Secondary | ICD-10-CM | POA: Diagnosis not present

## 2013-04-22 DIAGNOSIS — R269 Unspecified abnormalities of gait and mobility: Secondary | ICD-10-CM | POA: Diagnosis not present

## 2013-04-22 DIAGNOSIS — F0390 Unspecified dementia without behavioral disturbance: Secondary | ICD-10-CM | POA: Diagnosis not present

## 2013-04-24 DIAGNOSIS — L89609 Pressure ulcer of unspecified heel, unspecified stage: Secondary | ICD-10-CM | POA: Diagnosis not present

## 2013-04-24 DIAGNOSIS — R269 Unspecified abnormalities of gait and mobility: Secondary | ICD-10-CM | POA: Diagnosis not present

## 2013-04-24 DIAGNOSIS — F0390 Unspecified dementia without behavioral disturbance: Secondary | ICD-10-CM | POA: Diagnosis not present

## 2013-04-24 DIAGNOSIS — L8995 Pressure ulcer of unspecified site, unstageable: Secondary | ICD-10-CM | POA: Diagnosis not present

## 2013-04-25 DIAGNOSIS — R269 Unspecified abnormalities of gait and mobility: Secondary | ICD-10-CM | POA: Diagnosis not present

## 2013-04-25 DIAGNOSIS — F0390 Unspecified dementia without behavioral disturbance: Secondary | ICD-10-CM | POA: Diagnosis not present

## 2013-04-25 DIAGNOSIS — L8995 Pressure ulcer of unspecified site, unstageable: Secondary | ICD-10-CM | POA: Diagnosis not present

## 2013-04-25 DIAGNOSIS — L89609 Pressure ulcer of unspecified heel, unspecified stage: Secondary | ICD-10-CM | POA: Diagnosis not present

## 2013-04-28 ENCOUNTER — Other Ambulatory Visit: Payer: Self-pay | Admitting: Family Medicine

## 2013-04-28 DIAGNOSIS — L89609 Pressure ulcer of unspecified heel, unspecified stage: Secondary | ICD-10-CM | POA: Diagnosis not present

## 2013-04-28 DIAGNOSIS — L8995 Pressure ulcer of unspecified site, unstageable: Secondary | ICD-10-CM | POA: Diagnosis not present

## 2013-04-28 DIAGNOSIS — F0390 Unspecified dementia without behavioral disturbance: Secondary | ICD-10-CM | POA: Diagnosis not present

## 2013-04-28 DIAGNOSIS — R269 Unspecified abnormalities of gait and mobility: Secondary | ICD-10-CM | POA: Diagnosis not present

## 2013-04-29 DIAGNOSIS — R269 Unspecified abnormalities of gait and mobility: Secondary | ICD-10-CM | POA: Diagnosis not present

## 2013-04-29 DIAGNOSIS — F0390 Unspecified dementia without behavioral disturbance: Secondary | ICD-10-CM | POA: Diagnosis not present

## 2013-04-29 DIAGNOSIS — L8995 Pressure ulcer of unspecified site, unstageable: Secondary | ICD-10-CM | POA: Diagnosis not present

## 2013-04-29 DIAGNOSIS — L89609 Pressure ulcer of unspecified heel, unspecified stage: Secondary | ICD-10-CM | POA: Diagnosis not present

## 2013-04-30 DIAGNOSIS — F0390 Unspecified dementia without behavioral disturbance: Secondary | ICD-10-CM | POA: Diagnosis not present

## 2013-04-30 DIAGNOSIS — L8995 Pressure ulcer of unspecified site, unstageable: Secondary | ICD-10-CM | POA: Diagnosis not present

## 2013-04-30 DIAGNOSIS — L89609 Pressure ulcer of unspecified heel, unspecified stage: Secondary | ICD-10-CM | POA: Diagnosis not present

## 2013-04-30 DIAGNOSIS — R269 Unspecified abnormalities of gait and mobility: Secondary | ICD-10-CM | POA: Diagnosis not present

## 2013-05-01 DIAGNOSIS — R269 Unspecified abnormalities of gait and mobility: Secondary | ICD-10-CM | POA: Diagnosis not present

## 2013-05-01 DIAGNOSIS — L89609 Pressure ulcer of unspecified heel, unspecified stage: Secondary | ICD-10-CM | POA: Diagnosis not present

## 2013-05-01 DIAGNOSIS — F0390 Unspecified dementia without behavioral disturbance: Secondary | ICD-10-CM | POA: Diagnosis not present

## 2013-05-01 DIAGNOSIS — L8995 Pressure ulcer of unspecified site, unstageable: Secondary | ICD-10-CM | POA: Diagnosis not present

## 2013-05-07 ENCOUNTER — Telehealth: Payer: Self-pay

## 2013-05-07 NOTE — Telephone Encounter (Signed)
Noted  

## 2013-05-07 NOTE — Telephone Encounter (Signed)
He needs to get seen.  That would be preferred, either here or UC.

## 2013-05-07 NOTE — Telephone Encounter (Signed)
Sandy pts niece and POA said pts urine appears dark and thick,low back bothering pt and now confusion has started (pt seeing things that are not there). Has not checked for fever. No abdominal pain. Difficult for pt to go up and down stairs at pts home due to arthritis in knee.(pt fell x 2 this past week; no apparent injury according to [place]). Lovey Newcomer wants to know if can do urine specimen ck rather than office appt. [place]. [place] request cb.

## 2013-05-07 NOTE — Telephone Encounter (Signed)
Seth Hunter notified as instructed by telephone. Seth Hunter stated that she is not with patient now,but received a call that he has been hallucinating.  Sandit stated that she will be going to see patient in about an hour and will take him to urgent care if she feels that he needs to be seen tonight. Appointment scheduled for 05/08/13 and [name] will call back and cancel appointment if she takes him to Urgent Care this evening.

## 2013-05-08 ENCOUNTER — Encounter: Payer: Self-pay | Admitting: Family Medicine

## 2013-05-08 ENCOUNTER — Ambulatory Visit (INDEPENDENT_AMBULATORY_CARE_PROVIDER_SITE_OTHER): Payer: Medicare Other | Admitting: Family Medicine

## 2013-05-08 VITALS — BP 108/76 | HR 76 | Temp 97.6°F | Wt 182.8 lb

## 2013-05-08 DIAGNOSIS — R82998 Other abnormal findings in urine: Secondary | ICD-10-CM | POA: Diagnosis not present

## 2013-05-08 DIAGNOSIS — R8290 Unspecified abnormal findings in urine: Secondary | ICD-10-CM

## 2013-05-08 DIAGNOSIS — N390 Urinary tract infection, site not specified: Secondary | ICD-10-CM

## 2013-05-08 MED ORDER — CIPROFLOXACIN HCL 500 MG PO TABS
500.0000 mg | ORAL_TABLET | Freq: Two times a day (BID) | ORAL | Status: DC
Start: 2013-05-08 — End: 2013-05-21

## 2013-05-08 NOTE — Patient Instructions (Addendum)
Try to bring back a urine sample in the meantime.   Start the cipro in the meantime and update me tomorrow.  His hallucinations will likely be worse at night.   Take care.

## 2013-05-08 NOTE — Progress Notes (Signed)
Pre visit review using our clinic review tool, if applicable. No additional management support is needed unless otherwise documented below in the visit note.  Hallucinating, no fevers.  Now oriented to self and month.  H/o UTIs, this is similar.  No vomiting. Not SOB.   Meds, vitals, and allergies reviewed.   ROS: See HPI.  Otherwise, noncontributory.  nad HOH Mmm rrr ctab Abd soft, suprapubic area mildly ttp Ext w/o edema Superficial abrasion on R shin, doesn't appear infected.

## 2013-05-09 DIAGNOSIS — N390 Urinary tract infection, site not specified: Secondary | ICD-10-CM | POA: Insufficient documentation

## 2013-05-09 DIAGNOSIS — R82998 Other abnormal findings in urine: Secondary | ICD-10-CM | POA: Diagnosis not present

## 2013-05-09 NOTE — Assessment & Plan Note (Signed)
Presumed, start cipro.  Call back with update.  Sent home with container for ucx.   Okay for outpatient f/u.   D/w family, agree.  No other focal sx to suggest other source.

## 2013-05-09 NOTE — Addendum Note (Signed)
Addended by: Ellamae Sia on: 05/09/2013 02:16 PM   Modules accepted: Orders

## 2013-05-11 LAB — URINE CULTURE

## 2013-05-21 ENCOUNTER — Telehealth: Payer: Self-pay

## 2013-05-21 MED ORDER — CIPROFLOXACIN HCL 500 MG PO TABS: 500.0000 mg | ORAL_TABLET | Freq: Two times a day (BID) | ORAL | Status: DC

## 2013-05-21 NOTE — Telephone Encounter (Signed)
Sandy pts niece and POA said pt finished antibiotic on 05/17/13 for UTI; pts symptoms went away; this AM pt having burning upon urination, did not ck temp; no other UTI symptoms and no hallucinations. Pt did not have circumcision and after urinating pt is being cleaned as well as possible. [place]. Lovey Newcomer wants to know if antibiotic can be refilled or what to do?

## 2013-05-21 NOTE — Telephone Encounter (Signed)
Sandi (niece) notified as instructed by telephone.

## 2013-05-21 NOTE — Telephone Encounter (Signed)
I would restart the abx given the recent hx.  Sent.  Thanks.  Update [place] as needed.

## 2013-06-01 ENCOUNTER — Encounter (HOSPITAL_COMMUNITY): Payer: Self-pay | Admitting: Emergency Medicine

## 2013-06-01 ENCOUNTER — Emergency Department (HOSPITAL_COMMUNITY)
Admission: EM | Admit: 2013-06-01 | Discharge: 2013-06-01 | Disposition: A | Discharge status: Home / Self Care | Payer: Medicare Other | Attending: Emergency Medicine | Admitting: Emergency Medicine

## 2013-06-01 DIAGNOSIS — Z87828 Personal history of other (healed) physical injury and trauma: Secondary | ICD-10-CM | POA: Insufficient documentation

## 2013-06-01 DIAGNOSIS — M47817 Spondylosis without myelopathy or radiculopathy, lumbosacral region: Secondary | ICD-10-CM | POA: Insufficient documentation

## 2013-06-01 DIAGNOSIS — Z7982 Long term (current) use of aspirin: Secondary | ICD-10-CM | POA: Insufficient documentation

## 2013-06-01 DIAGNOSIS — H269 Unspecified cataract: Secondary | ICD-10-CM | POA: Insufficient documentation

## 2013-06-01 DIAGNOSIS — Z79899 Other long term (current) drug therapy: Secondary | ICD-10-CM | POA: Insufficient documentation

## 2013-06-01 DIAGNOSIS — Z950 Presence of cardiac pacemaker: Secondary | ICD-10-CM | POA: Insufficient documentation

## 2013-06-01 DIAGNOSIS — M19019 Primary osteoarthritis, unspecified shoulder: Secondary | ICD-10-CM | POA: Insufficient documentation

## 2013-06-01 DIAGNOSIS — Z880 Allergy status to penicillin: Secondary | ICD-10-CM | POA: Diagnosis not present

## 2013-06-01 DIAGNOSIS — Z8781 Personal history of (healed) traumatic fracture: Secondary | ICD-10-CM | POA: Diagnosis not present

## 2013-06-01 DIAGNOSIS — J4489 Other specified chronic obstructive pulmonary disease: Secondary | ICD-10-CM | POA: Insufficient documentation

## 2013-06-01 DIAGNOSIS — I10 Essential (primary) hypertension: Secondary | ICD-10-CM | POA: Insufficient documentation

## 2013-06-01 DIAGNOSIS — Z8719 Personal history of other diseases of the digestive system: Secondary | ICD-10-CM | POA: Insufficient documentation

## 2013-06-01 DIAGNOSIS — Z862 Personal history of diseases of the blood and blood-forming organs and certain disorders involving the immune mechanism: Secondary | ICD-10-CM | POA: Diagnosis not present

## 2013-06-01 DIAGNOSIS — F0390 Unspecified dementia without behavioral disturbance: Secondary | ICD-10-CM | POA: Diagnosis not present

## 2013-06-01 DIAGNOSIS — J449 Chronic obstructive pulmonary disease, unspecified: Secondary | ICD-10-CM | POA: Insufficient documentation

## 2013-06-01 DIAGNOSIS — H9190 Unspecified hearing loss, unspecified ear: Secondary | ICD-10-CM | POA: Diagnosis not present

## 2013-06-01 DIAGNOSIS — N400 Enlarged prostate without lower urinary tract symptoms: Secondary | ICD-10-CM | POA: Insufficient documentation

## 2013-06-01 DIAGNOSIS — Z8701 Personal history of pneumonia (recurrent): Secondary | ICD-10-CM | POA: Diagnosis not present

## 2013-06-01 DIAGNOSIS — R300 Dysuria: Secondary | ICD-10-CM | POA: Insufficient documentation

## 2013-06-01 DIAGNOSIS — Z87440 Personal history of urinary (tract) infections: Secondary | ICD-10-CM

## 2013-06-01 LAB — URINALYSIS, ROUTINE W REFLEX MICROSCOPIC
Bilirubin Urine: NEGATIVE
Glucose, UA: NEGATIVE mg/dL
HGB URINE DIPSTICK: NEGATIVE
Ketones, ur: NEGATIVE mg/dL
Leukocytes, UA: NEGATIVE
Nitrite: NEGATIVE
Protein, ur: NEGATIVE mg/dL
SPECIFIC GRAVITY, URINE: 1.01 (ref 1.005–1.030)
Urobilinogen, UA: 0.2 mg/dL (ref 0.0–1.0)
pH: 6 (ref 5.0–8.0)

## 2013-06-01 NOTE — ED Provider Notes (Signed)
CSN: [id number]     Arrival date & time 06/01/13  0225 History   First MD Initiated Contact with Patient 06/01/13 [phone]     Chief Complaint  Patient presents with  . Dysuria     (Consider location/radiation/quality/duration/timing/severity/associated sxs/prior Treatment) HPI  80 yo male with history of recurrent UTI presents with dysuria.  Patient's family reports recent treatment x 2 with ciprofloxaxin.  Family reports patient called them to say that he still has burning with urination.  No known fevers.  Otherwise patient is at baseline.  History of dementia and noncontributory to history taking but does denies CP, SOB, abdominal pain.  Level 5 caveat for dementia  Past Medical History  Diagnosis Date  . Syncope     Syncope in the setting of complete heart block status post permanent pacemaker placement on September 27, 2007  . Hypertension   . Dementia     thought this appears to be mild based on reports of family as of 8/11  . Elevated TSH     Mildly elevated TSH  . Atrioventricular block, complete   . Pacemaker -MDT   . COPD (chronic obstructive pulmonary disease) 04/1995    X-ray  . BPH (benign prostatic hyperplasia)   . Tremor   . Pneumonia 04/1995    Bilateral upper lobes  . Anemia 04/1995    Post op, 2nd to hemorrhage, left leg  . UTI (urinary tract infection) 05/1995  . DJD (degenerative joint disease), lumbar 04/1995    Spine, severe.   X-ray  . DJD (degenerative joint disease) 04/1995    Both shoulders, severe  . HOH (hard of hearing)   . Inguinal hernia   . Hip fracture    Past Surgical History  Procedure Laterality Date  . Pacemaker insertion    . Cholecystectomy  1988  . Hernia repair  Years ago    Left inguinal  . Ventral hernia repair  04/1995    with intra abdominal adhesions, lysis incidental appendectomy  . Pulmonary embolism surgery  04/1995    Post-op  . 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 Hematoma, left leg  05/1995     Probably 2nd to anticoag  . 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 Fractured left wrist  1990    with old right posterior rib fracture  . Syncope comp heart block  9/10- 09/30/2007    HOSP Perm Pacer placed, HTN, dementia, mildly elevated TSH  . Pacer placement (dr. Lovena Le)  09/27/2007  . Ct angio chest  09/27/07    No PE, No acute prob  . Femur im nail Left 10/21/2012    Procedure: INTRAMEDULLARY (IM) NAIL FEMORAL;  Surgeon: Meredith Pel, MD;  Location: [facility];  Service: Orthopedics;  Laterality: Left;   Family History  Problem Relation Age of Onset  . Heart disease Father     Hardening of the arteries, heart trouble   History  Substance Use Topics  . Smoking status: Never Smoker   . Smokeless tobacco: Not on file  . Alcohol Use: No    Review of Systems  Unable to perform ROS: Dementia      Allergies  Penicillins  Home Medications   Prior to Admission medications   Medication Sig Start Date End Date Taking? Authorizing Provider  aspirin EC 81 MG tablet Take 81 mg by mouth daily.   Yes Historical Provider, MD  finasteride (PROSCAR) 5 MG tablet  TAKE ONE (1) TABLET BY MOUTH EVERY DAY 04/28/13  Yes Tonia Ghent, MD  Garlic 10 MG CAPS Take 1 capsule by mouth daily.   Yes Historical Provider, MD  hydrochlorothiazide (MICROZIDE) 12.5 MG capsule Take 12.5 mg by mouth every morning.   Yes Historical Provider, MD  magnesium citrate SOLN Take 0.5 Bottles by mouth as needed (for constipation).    Yes Historical Provider, MD  Menthol, Topical Analgesic, (BIOFREEZE) 4 % GEL Apply 1 application topically at bedtime. Applied to knees for arthritis   Yes Historical Provider, MD  ciprofloxacin (CIPRO) 500 MG tablet Take 500 mg by mouth 2 (two) times daily. 10 day therapy course patient completed on 05/30/2013. 05/21/13   Tonia Ghent, MD   BP 129/64  Pulse 77  Temp(Src) 97.4 F (36.3 C) (Oral)  Resp 18  Ht 6\' 1"  (1.854 m)  Wt 182 lb (82.555  kg)  BMI 24.02 kg/m2  SpO2 99% Physical Exam  Nursing note and vitals reviewed. Constitutional: No distress.  elderly  HENT:  Head: Normocephalic and atraumatic.  Eyes:  cataracts  Cardiovascular: Normal rate, regular rhythm and normal heart sounds.   No murmur heard. Pulmonary/Chest: Effort normal. No respiratory distress.  Abdominal: Soft. Bowel sounds are normal. He exhibits no distension. There is no tenderness. There is no rebound and no guarding.  Genitourinary: Penis normal.  Patient with diaper on, no crepitus or erythema noted  Lymphadenopathy:    He has no cervical adenopathy.  Neurological: He is alert.  Skin: Skin is warm and dry.  Psychiatric: He has a normal mood and affect.    ED Course  Procedures (including critical care time) Labs Review Labs Reviewed  URINALYSIS, ROUTINE W REFLEX MICROSCOPIC    Imaging Review No results found.   EKG Interpretation None      MDM   Final diagnoses:  History of recurrent UTI (urinary tract infection)    Patient presents with family with concerns for continued buring the urination.  VS reassuring and exam benign.  Patient with dementia but denies any other complaints.  UA without evidence of acute UTI.  Discussed with family.  They have no other concerns at this time and state patient is otherwise at baseline.  Will d/c home with PCP follow-up.  After history, exam, and medical workup I feel the patient has been appropriately medically screened and is safe for discharge home. Pertinent diagnoses were discussed with the patient. Patient was given return precautions.    Merryl Hacker, MD 06/01/13 [phone]

## 2013-06-01 NOTE — Discharge Instructions (Signed)
You were evaluated for a urinary tract infection. At this time there does not appear to be any infection. You need to followup with her primary Dr. If you have any new concerning symptoms including fevers, abdominal pain, or worsening of current symptoms you should be reevaluated.

## 2013-06-01 NOTE — ED Notes (Signed)
Pt presented by family from home, report that pt called them reporting burning sensation with urination. Pt just finished a round of abx for UTI. Pt in NAD this time.

## 2013-06-16 ENCOUNTER — Encounter: Payer: Self-pay | Admitting: Cardiology

## 2013-06-18 ENCOUNTER — Telehealth: Payer: Self-pay | Admitting: Internal Medicine

## 2013-06-18 NOTE — Telephone Encounter (Addendum)
1-0-31 sent certified letter re past due device ck/mt Certified letter signed by Fanny Bien, appt made 08-12-13/mt

## 2013-08-12 ENCOUNTER — Ambulatory Visit (INDEPENDENT_AMBULATORY_CARE_PROVIDER_SITE_OTHER): Payer: Medicare Other | Admitting: Internal Medicine

## 2013-08-12 ENCOUNTER — Encounter: Payer: Self-pay | Admitting: Internal Medicine

## 2013-08-12 VITALS — BP 114/69 | HR 88 | Ht 74.0 in | Wt 186.0 lb

## 2013-08-12 DIAGNOSIS — I442 Atrioventricular block, complete: Secondary | ICD-10-CM

## 2013-08-12 DIAGNOSIS — Z950 Presence of cardiac pacemaker: Secondary | ICD-10-CM | POA: Diagnosis not present

## 2013-08-12 LAB — MDC_IDC_ENUM_SESS_TYPE_INCLINIC
Battery Remaining Longevity: 40 mo
Battery Voltage: 2.76 V
Brady Statistic AS VS Percent: 2 %
Lead Channel Impedance Value: 401 Ohm
Lead Channel Impedance Value: 480 Ohm
Lead Channel Pacing Threshold Amplitude: 1 V
Lead Channel Pacing Threshold Pulse Width: 0.4 ms
Lead Channel Sensing Intrinsic Amplitude: 4 mV
Lead Channel Setting Pacing Amplitude: 2 V
Lead Channel Setting Pacing Pulse Width: 0.4 ms
Lead Channel Setting Sensing Sensitivity: 2 mV
MDC IDC MSMT BATTERY IMPEDANCE: 1371 Ohm
MDC IDC MSMT LEADCHNL RA PACING THRESHOLD AMPLITUDE: 0.75 V
MDC IDC MSMT LEADCHNL RA PACING THRESHOLD PULSEWIDTH: 0.4 ms
MDC IDC SESS DTM: 20150728094850
MDC IDC SET LEADCHNL RV PACING AMPLITUDE: 2.5 V
MDC IDC STAT BRADY AP VP PERCENT: 14 %
MDC IDC STAT BRADY AP VS PERCENT: 0 %
MDC IDC STAT BRADY AS VP PERCENT: 84 %

## 2013-08-12 NOTE — Patient Instructions (Signed)

## 2013-08-12 NOTE — Progress Notes (Signed)
Patient Care Team: Tonia Ghent, MD as PCP - General Jeremy Johann Haskins-Scott, RN as Registered Nurse   HPI  Seth Hunter is a 80 y.o. male Seen in pacemaker followup for the first time since January 2013. He underwent pacemaker implantation 2000 for complete heart block  Past Medical History  Diagnosis Date  . Syncope     Syncope in the setting of complete heart block status post permanent pacemaker placement on September 27, 2007  . Hypertension   . Dementia     thought this appears to be mild based on reports of family as of 8/11  . Elevated TSH     Mildly elevated TSH  . Atrioventricular block, complete   . Pacemaker -MDT   . COPD (chronic obstructive pulmonary disease) 04/1995    X-ray  . BPH (benign prostatic hyperplasia)   . Tremor   . Pneumonia 04/1995    Bilateral upper lobes  . Anemia 04/1995    Post op, 2nd to hemorrhage, left leg  . UTI (urinary tract infection) 05/1995  . DJD (degenerative joint disease), lumbar 04/1995    Spine, severe.   X-ray  . DJD (degenerative joint disease) 04/1995    Both shoulders, severe  . HOH (hard of hearing)   . Inguinal hernia   . Hip fracture     Past Surgical History  Procedure Laterality Date  . Pacemaker insertion    . Cholecystectomy  1988  . Hernia repair  Years ago    Left inguinal  . Ventral hernia repair  04/1995    with intra abdominal adhesions, lysis incidental appendectomy  . Pulmonary embolism surgery  04/1995    Post-op  . 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 Hematoma, left leg  05/1995    Probably 2nd to anticoag  . 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 Fractured left wrist  1990    with old right posterior rib fracture  . Syncope comp heart block  9/10- 09/30/2007    HOSP Perm Pacer placed, HTN, dementia, mildly elevated TSH  . Pacer placement (dr. Lovena Le)  09/27/2007  . Ct angio chest  09/27/07    No PE, No acute prob  . Femur im  nail Left 10/21/2012    Procedure: INTRAMEDULLARY (IM) NAIL FEMORAL;  Surgeon: Meredith Pel, MD;  Location: [facility];  Service: Orthopedics;  Laterality: Left;    Current Outpatient Prescriptions  Medication Sig Dispense Refill  . aspirin EC 81 MG tablet Take 81 mg by mouth daily.      . finasteride (PROSCAR) 5 MG tablet TAKE ONE (1) TABLET BY MOUTH EVERY DAY  90 tablet  0  . Garlic 10 MG CAPS Take 1 capsule by mouth daily.      . hydrochlorothiazide (MICROZIDE) 12.5 MG capsule Take 12.5 mg by mouth every morning.      . Menthol, Topical Analgesic, (BIOFREEZE) 4 % GEL Apply 1 application topically at bedtime. Applied to knees for arthritis       No current facility-administered medications for this visit.    Allergies  Allergen Reactions  . Penicillins Other (See Comments)    unknown    Review of Systems negative except from HPI and PMH  Physical Exam BP 114/69  Pulse 88  Ht 6\' 2"  (1.88 m)  Wt 186 lb (84.369 kg)  BMI 23.87 kg/m2 Well developed and well nourished in no  acute distress HENT normal E scleral and icterus clear Neck Supple Clear to ausculation Device pocket well healed; without hematoma or erythema.  There is no tethering Regular rate and rhythm, no murmurs gallops or rub Soft with active bowel sounds No clubbing cyanosis 2+ Edema Alert sitting in wheel chair tremor Skin Warm and Dry  ECG demonstrates AV pacing at 90 Intervals 33/16/38  Assessment and  Plan  Complete heart block  Syncope  Pacemaker-[employer]  Peripheral edema/HFpEF  Device function is stable. There has been no intercurrent syncope. No programming changes were made. We will not adjust his diuretics as it is difficult for him to get to the bathroom. We'll try and use gravity to help mobilize his fluid  Blood work last December demonstrated normal potassium and renal function.

## 2013-08-20 ENCOUNTER — Other Ambulatory Visit: Payer: Self-pay | Admitting: Family Medicine

## 2013-09-02 ENCOUNTER — Encounter: Payer: Self-pay | Admitting: Family Medicine

## 2013-09-02 ENCOUNTER — Ambulatory Visit (INDEPENDENT_AMBULATORY_CARE_PROVIDER_SITE_OTHER): Payer: Medicare Other | Admitting: Family Medicine

## 2013-09-02 VITALS — BP 104/64 | HR 93 | Temp 97.8°F | Wt 187.8 lb

## 2013-09-02 DIAGNOSIS — R1319 Other dysphagia: Secondary | ICD-10-CM | POA: Diagnosis not present

## 2013-09-02 MED ORDER — FINASTERIDE 5 MG PO TABS: ORAL_TABLET | ORAL | Status: DC

## 2013-09-02 MED ORDER — CIPROFLOXACIN HCL 500 MG PO TABS: 500.0000 mg | ORAL_TABLET | Freq: Two times a day (BID) | ORAL | Status: DC

## 2013-09-02 MED ORDER — HYDROCHLOROTHIAZIDE 12.5 MG PO CAPS: 12.5000 mg | ORAL_CAPSULE | ORAL | Status: DC

## 2013-09-02 NOTE — Progress Notes (Signed)
Pre visit review using our clinic review tool, if applicable. No additional management support is needed unless otherwise documented below in the visit note.  Dysphagia with solids > liquids.  Possible aspiration event last week.  Prev with swallowing eval done.  He has chopped foods, is using aspiration precautions, supervision.    Possible UTI.  Burning with urination.  H/o similar with UTIs prev.  No hallucinations currently but has had them with UTI in the past.  Family brought in patient as soon as sx were suggestive, given his hx.  Unable to get urine specimen at OV.   Until recent sx above, patient had made progress at home, walking some with assistance, had been able to go to some events out of the home recently.    Meds, vitals, and allergies reviewed.   ROS: See HPI.  Otherwise, noncontributory.  nad Hard of hearing, at baseline.  Mmm Tremor in jaw and hands at baseline rrr ctab Ext with trace edema abd soft, not ttp No cva pain

## 2013-09-02 NOTE — Patient Instructions (Signed)
Keep your legs propped up and start the antibiotics today.   Small bites, chopped food, sitting up after eating.  Notify me if worse.

## 2013-09-03 DIAGNOSIS — R1319 Other dysphagia: Secondary | ICD-10-CM | POA: Insufficient documentation

## 2013-09-03 NOTE — Assessment & Plan Note (Signed)
Prev swallowing eval done.  Repeat testing at this point not likely to change mgmt, would continue asp precautions and food prep as is.  D/w pt and family.  Asp risk is known, discussed. With ctab lungs today, wouldn't intervene o/w.  Will be on cipro for presumed UTI.  Call back as needed.

## 2013-09-03 NOTE — Assessment & Plan Note (Signed)
Presumed, start abx given his hx even w/o u/a done.  Benefit > risk, d/w pt and family.  All agree, call back as needed.  Nontoxic.

## 2013-10-01 ENCOUNTER — Other Ambulatory Visit: Payer: Self-pay | Admitting: Family Medicine

## 2014-02-19 ENCOUNTER — Encounter: Payer: Self-pay | Admitting: *Deleted

## 2014-02-27 ENCOUNTER — Encounter (HOSPITAL_COMMUNITY): Payer: Self-pay | Admitting: *Deleted

## 2014-02-27 ENCOUNTER — Inpatient Hospital Stay (HOSPITAL_COMMUNITY)
Admission: EM | Admit: 2014-02-27 | Discharge: 2014-03-01 | DRG: 378 | Disposition: A | Discharge status: Home Health | Payer: Medicare Other | Attending: Internal Medicine | Admitting: Internal Medicine

## 2014-02-27 ENCOUNTER — Emergency Department (HOSPITAL_COMMUNITY): Payer: Medicare Other

## 2014-02-27 DIAGNOSIS — N400 Enlarged prostate without lower urinary tract symptoms: Secondary | ICD-10-CM | POA: Diagnosis present

## 2014-02-27 DIAGNOSIS — K625 Hemorrhage of anus and rectum: Secondary | ICD-10-CM | POA: Diagnosis not present

## 2014-02-27 DIAGNOSIS — Z66 Do not resuscitate: Secondary | ICD-10-CM | POA: Diagnosis present

## 2014-02-27 DIAGNOSIS — K922 Gastrointestinal hemorrhage, unspecified: Secondary | ICD-10-CM

## 2014-02-27 DIAGNOSIS — H9190 Unspecified hearing loss, unspecified ear: Secondary | ICD-10-CM | POA: Diagnosis present

## 2014-02-27 DIAGNOSIS — Z950 Presence of cardiac pacemaker: Secondary | ICD-10-CM

## 2014-02-27 DIAGNOSIS — I10 Essential (primary) hypertension: Secondary | ICD-10-CM | POA: Diagnosis present

## 2014-02-27 DIAGNOSIS — R000 Tachycardia, unspecified: Secondary | ICD-10-CM | POA: Diagnosis present

## 2014-02-27 DIAGNOSIS — R031 Nonspecific low blood-pressure reading: Secondary | ICD-10-CM | POA: Diagnosis not present

## 2014-02-27 DIAGNOSIS — D62 Acute posthemorrhagic anemia: Secondary | ICD-10-CM | POA: Diagnosis present

## 2014-02-27 DIAGNOSIS — K5791 Diverticulosis of intestine, part unspecified, without perforation or abscess with bleeding: Principal | ICD-10-CM | POA: Diagnosis present

## 2014-02-27 DIAGNOSIS — R9431 Abnormal electrocardiogram [ECG] [EKG]: Secondary | ICD-10-CM | POA: Diagnosis not present

## 2014-02-27 DIAGNOSIS — D72829 Elevated white blood cell count, unspecified: Secondary | ICD-10-CM | POA: Diagnosis not present

## 2014-02-27 DIAGNOSIS — J449 Chronic obstructive pulmonary disease, unspecified: Secondary | ICD-10-CM | POA: Diagnosis present

## 2014-02-27 DIAGNOSIS — R0602 Shortness of breath: Secondary | ICD-10-CM | POA: Diagnosis not present

## 2014-02-27 DIAGNOSIS — J811 Chronic pulmonary edema: Secondary | ICD-10-CM | POA: Diagnosis not present

## 2014-02-27 LAB — APTT: APTT: 32 s (ref 24–37)

## 2014-02-27 LAB — CBC
HCT: 38.1 % — ABNORMAL LOW (ref 39.0–52.0)
HEMATOCRIT: 38 % — AB (ref 39.0–52.0)
HEMOGLOBIN: 12.7 g/dL — AB (ref 13.0–17.0)
Hemoglobin: 12.4 g/dL — ABNORMAL LOW (ref 13.0–17.0)
MCH: 31 pg (ref 26.0–34.0)
MCH: 31.4 pg (ref 26.0–34.0)
MCHC: 32.5 g/dL (ref 30.0–36.0)
MCHC: 33.4 g/dL (ref 30.0–36.0)
MCV: 95.3 fL (ref 78.0–100.0)
MCV: 93.8 fL (ref 78.0–100.0)
PLATELETS: 438 10*3/uL — AB (ref 150–400)
Platelets: 378 10*3/uL (ref 150–400)
RBC: 4 MIL/uL — ABNORMAL LOW (ref 4.22–5.81)
RBC: 4.05 MIL/uL — AB (ref 4.22–5.81)
RDW: 14.5 % (ref 11.5–15.5)
RDW: 15 % (ref 11.5–15.5)
WBC: 17.3 10*3/uL — ABNORMAL HIGH (ref 4.0–10.5)
WBC: 14.5 10*3/uL — AB (ref 4.0–10.5)

## 2014-02-27 LAB — CBC WITH DIFFERENTIAL/PLATELET
BASOS ABS: 0 10*3/uL (ref 0.0–0.1)
Basophils Relative: 0 % (ref 0–1)
EOS PCT: 3 % (ref 0–5)
Eosinophils Absolute: 0.5 10*3/uL (ref 0.0–0.7)
HCT: 41.9 % (ref 39.0–52.0)
Hemoglobin: 13.6 g/dL (ref 13.0–17.0)
LYMPHS PCT: 27 % (ref 12–46)
Lymphs Abs: 4.7 10*3/uL — ABNORMAL HIGH (ref 0.7–4.0)
MCH: 30.9 pg (ref 26.0–34.0)
MCHC: 32.5 g/dL (ref 30.0–36.0)
MCV: 95.2 fL (ref 78.0–100.0)
Monocytes Absolute: 1.9 10*3/uL — ABNORMAL HIGH (ref 0.1–1.0)
Monocytes Relative: 11 % (ref 3–12)
NEUTROS ABS: 10.4 10*3/uL — AB (ref 1.7–7.7)
Neutrophils Relative %: 60 % (ref 43–77)
PLATELETS: 483 10*3/uL — AB (ref 150–400)
RBC: 4.4 MIL/uL (ref 4.22–5.81)
RDW: 14.5 % (ref 11.5–15.5)
WBC: 17.5 10*3/uL — AB (ref 4.0–10.5)

## 2014-02-27 LAB — COMPREHENSIVE METABOLIC PANEL
ALBUMIN: 3.4 g/dL — AB (ref 3.5–5.2)
ALT: 21 U/L (ref 0–53)
AST: 28 U/L (ref 0–37)
Alkaline Phosphatase: 78 U/L (ref 39–117)
Anion gap: 10 (ref 5–15)
BUN: 28 mg/dL — AB (ref 6–23)
CO2: 25 mmol/L (ref 19–32)
Calcium: 8.8 mg/dL (ref 8.4–10.5)
Chloride: 104 mmol/L (ref 96–112)
Creatinine, Ser: 1.08 mg/dL (ref 0.50–1.35)
GFR calc Af Amer: 66 mL/min — ABNORMAL LOW (ref >90)
GFR calc non Af Amer: 57 mL/min — ABNORMAL LOW (ref >90)
Glucose, Bld: 104 mg/dL — ABNORMAL HIGH (ref 70–99)
Potassium: 4 mmol/L (ref 3.5–5.1)
Sodium: 139 mmol/L (ref 135–145)
Total Bilirubin: 0.6 mg/dL (ref 0.3–1.2)
Total Protein: 6.9 g/dL (ref 6.0–8.3)

## 2014-02-27 LAB — PROTIME-INR
INR: 1.02 (ref 0.00–1.49)
PROTHROMBIN TIME: 13.5 s (ref 11.6–15.2)

## 2014-02-27 LAB — LACTIC ACID, PLASMA: Lactic Acid, Venous: 0.8 mmol/L (ref 0.5–2.0)

## 2014-02-27 LAB — MRSA PCR SCREENING: MRSA BY PCR: NEGATIVE

## 2014-02-27 LAB — PREPARE RBC (CROSSMATCH)

## 2014-02-27 LAB — ABO/RH: ABO/RH(D): AB POS

## 2014-02-27 MED ORDER — FINASTERIDE 5 MG PO TABS
5.0000 mg | ORAL_TABLET | Freq: Every day | ORAL | Status: DC
  Administered 2014-02-28 – 2014-03-01 (×2): 5 mg via ORAL
  Filled 2014-02-27 (×2): qty 1

## 2014-02-27 MED ORDER — PANTOPRAZOLE SODIUM 40 MG IV SOLR
40.0000 mg | INTRAVENOUS | Status: DC
  Administered 2014-02-27 – 2014-02-28 (×2): 40 mg via INTRAVENOUS
  Filled 2014-02-27 (×2): qty 40

## 2014-02-27 MED ORDER — SODIUM CHLORIDE 0.9 % IV SOLN
Freq: Once | INTRAVENOUS | Status: AC
  Administered 2014-02-27: 05:00:00 via INTRAVENOUS

## 2014-02-27 MED ORDER — SODIUM CHLORIDE 0.9 % IV BOLUS (SEPSIS)
1500.0000 mL | Freq: Once | INTRAVENOUS | Status: AC
  Administered 2014-02-27: 1500 mL via INTRAVENOUS

## 2014-02-27 MED ORDER — CHLORHEXIDINE GLUCONATE 0.12 % MT SOLN
15.0000 mL | Freq: Two times a day (BID) | OROMUCOSAL | Status: DC
  Administered 2014-02-27 – 2014-02-28 (×3): 15 mL via OROMUCOSAL
  Filled 2014-02-27 (×3): qty 15

## 2014-02-27 MED ORDER — SODIUM CHLORIDE 0.9 % IV SOLN
INTRAVENOUS | Status: DC
  Administered 2014-02-27 – 2014-03-01 (×3): via INTRAVENOUS

## 2014-02-27 MED ORDER — CETYLPYRIDINIUM CHLORIDE 0.05 % MT LIQD
7.0000 mL | Freq: Two times a day (BID) | OROMUCOSAL | Status: DC
  Administered 2014-02-28 (×2): 7 mL via OROMUCOSAL

## 2014-02-27 MED ORDER — SODIUM CHLORIDE 0.9 % IJ SOLN
3.0000 mL | Freq: Two times a day (BID) | INTRAMUSCULAR | Status: DC
  Administered 2014-02-27 (×2): 3 mL via INTRAVENOUS

## 2014-02-27 NOTE — ED Notes (Signed)
Pt cleaned and bedding/pad changed under pt, pt had large bright red stool.

## 2014-02-27 NOTE — ED Provider Notes (Addendum)
CSN: [id number]     Arrival date & time 02/27/14  0144 History   First MD Initiated Contact with Patient 02/27/14 0151     Chief Complaint  Patient presents with  . Rectal Bleeding      HPI Patient presents with frank lower GI bleed which is red in color which began abruptly tonight.  He is continue to have ongoing bleeding from his rectum.  He denies use of anticoagulants.  No known history of diverticulosis.  He's never had this happen before.  He denies nausea vomiting.  Denies abdominal pain.  Was brought to the emergency department by EMS.  He has a tremor arrest which is making his pulse  Ox and HR read out abnormally.  He has a documented pulse to 225 however while I was in the room his heart rate is 101.  His oxygen saturation was 94%   Past Medical History  Diagnosis Date  . UTI (urinary tract infection)   . COPD (chronic obstructive pulmonary disease)    Past Surgical History  Procedure Laterality Date  . Cholecystectomy    . Fracture surgery  2014    L femur   No family history on file. History  Substance Use Topics  . Smoking status: Never Smoker   . Smokeless tobacco: Not on file  . Alcohol Use: No    Review of Systems  All other systems reviewed and are negative.     Allergies  Review of patient's allergies indicates no known allergies.  Home Medications   Prior to Admission medications   Not on File   BP 189/50 mmHg  Temp(Src) 98.3 F (36.8 C) (Oral)  Resp 18  SpO2 90% Physical Exam  Constitutional: He is oriented to person, place, and time. He appears well-developed and well-nourished.  HENT:  Head: Normocephalic and atraumatic.  Eyes: EOM are normal.  Neck: Normal range of motion.  Cardiovascular: Normal rate, regular rhythm, normal heart sounds and intact distal pulses.   Pulmonary/Chest: Effort normal and breath sounds normal. No respiratory distress.  Abdominal: Soft. He exhibits no distension. There is no tenderness.  Genitourinary:   Gross blood slowly leaking from rectum.  Diaper filled with a large amount of clotted blood  Musculoskeletal: Normal range of motion.  Neurological: He is alert and oriented to person, place, and time.  Resting tremor of his bilateral hands  Skin: Skin is warm and dry.  Psychiatric: He has a normal mood and affect. Judgment normal.  Nursing note and vitals reviewed.   ED Course  Procedures (including critical care time)  CRITICAL CARE Performed by: Hoy Morn Total critical care time: 32 Critical care time was exclusive of separately billable procedures and treating other patients. Critical care was necessary to treat or prevent imminent or life-threatening deterioration. Critical care was time spent personally by me on the following activities: development of treatment plan with patient and/or surrogate as well as nursing, discussions with consultants, evaluation of patient's response to treatment, examination of patient, obtaining history from patient or surrogate, ordering and performing treatments and interventions, ordering and review of laboratory studies, ordering and review of radiographic studies, pulse oximetry and re-evaluation of patient's condition.   Labs Review Labs Reviewed  CBC WITH DIFFERENTIAL/PLATELET - Abnormal; Notable for the following:    WBC 17.5 (*)    Platelets 483 (*)    Neutro Abs 10.4 (*)    Lymphs Abs 4.7 (*)    Monocytes Absolute 1.9 (*)    All other components  within normal limits  COMPREHENSIVE METABOLIC PANEL - Abnormal; Notable for the following:    Glucose, Bld 104 (*)    BUN 28 (*)    Albumin 3.4 (*)    GFR calc non Af Amer 57 (*)    GFR calc Af Amer 66 (*)    All other components within normal limits  CBC - Abnormal; Notable for the following:    WBC 17.3 (*)    RBC 4.00 (*)    Hemoglobin 12.4 (*)    HCT 38.1 (*)    Platelets 438 (*)    All other components within normal limits  PROTIME-INR  CBC  TYPE AND SCREEN  ABO/RH   PREPARE RBC (CROSSMATCH)    Imaging Review Dg Chest Port 1 View  02/27/2014   CLINICAL DATA:  Shortness of breath for several hr. Rectal bleeding.  EXAM: PORTABLE CHEST - 1 VIEW  COMPARISON:  None.  FINDINGS: Cardiac pacemaker lead tips over the RA and RV regions. Shallow inspiration. Mild cardiac enlargement. Mild pulmonary vascular prominence suggesting mild congestion. No focal airspace disease or consolidation. No blunting of costophrenic angles. No pneumothorax. Calcified and tortuous aorta. Degenerative changes in the spine and shoulders.  IMPRESSION: Mild cardiac enlargement and mild pulmonary vascular congestion. No focal consolidation or edema.   Electronically Signed   By: Lucienne Capers M.D.   On: 02/27/2014 02:41     EKG Interpretation   Date/Time:  Friday February 27 2014 02:08:21 EST Ventricular Rate:  101 PR Interval:  39 QRS Duration: 127 QT Interval:  388 QTC Calculation: 503 R Axis:   -69 Text Interpretation:  Ventricular-paced complexes No further analysis  attempted due to paced rhythm No old tracing to compare Confirmed by  Gregroy Dombkowski  MD, Tensley Wery ([id number]) on 02/27/2014 3:31:51 AM      MDM   Final diagnoses:  Lower GI bleed    Significant lower GI bleed.  Hemodynamically stable at this time.  He'll need to be admitted to stepdown.  He will need serial CBCs.  If his bleeding does not improve he may benefit from interventional radiology after a red blood cell tag study.  He is a DO NOT RESUSCITATE per his family  Dear time of admission the patient's blood pressure dropped to around 92 systolic.  He has return of a significant amount of blood and clotted blood in his bed.  I suspect he is bleeding rather briskly.  Decision was made to transfuse 2 units of blood at this time.  Also be given a 500 cc fluid bolus as I ordered that is hemoglobin has not equilibrated yet.  Again his heart rate is documented as 225 as well as 200 but these are not correct and this was  secondary to his tremor.  His pulse oximetry readings are the same as well.  I've inform nursing staff and they will make some corrections.  Vital signs at 5:00 in the morning are as noted below   Filed Vitals:   02/27/14 0500  BP: 111/40  Pulse: 101  Temp: 98.5 F (36.9 C)  Resp: 18     5:52 AM Spoke with GI, Dr. Deatra Ina who will evaluate in the hospital this morning.   Hoy Morn, MD 02/27/14 [place], MD 02/27/14 [phone]

## 2014-02-27 NOTE — Progress Notes (Addendum)
Patient ID: Seth Hunter, male   DOB: [birth year], 81 y.o.   MRN: [id number] [facility] PROGRESS NOTE  Sedrick Tober PIR:[id number] DOB: [birth year] DOA: 03/01/14 PCP: No primary care provider on file.  Brief narrative:    Addendum to admission note done 01-Mar-2014. 81 year old male with past medical history of COPD, hypertension, BPH who presented with large volume rectal bleed started couple of hours prior to this admission. Emergency room, patient continued to have ongoing bleeding. Patient is only on aspirin but not other blood thinners. On admission, blood pressure was 98/59 but subsequently elevated at 189/50 HR 90, afebrile and oxygen saturation recorded at 66% however subsequent record indicates 100%. The 12-lead EKG showed paced rhythm. Initial hemoglobin was 13.6 and repeat 12.4. Blood work also revealed leukocytosis of 17.5. X-ray did not reveal focal consolidation or edema.   Assessment/Plan:    Principal Problem: Acute lower GI bleed / acute blood loss anemia - Painless rectal bleed, likely diverticular. Hemoglobin was 13.6 on admission and subsequently 12.4. Blood transfusion ordered in ED because of ongoing bleed even though hemoglobin seems to be stable. - Gastroenterology consulted by ED, we'll follow-up on recommendations. - Hold aspirin. - Continue Protonix 40 mg IV every 12 hours. - Keep nothing by mouth until seen by GI. Supportive care with IV fluids. - Palliative care consulted for goals of care.  Active Problems:   Leukocytosis - Likely reactive. No evidence of pneumonia on chest x-ray. No fevers.   DVT Prophylaxis  - SCDs bilaterally because of risk of bleeding   Code Status: DNR/DNI Family Communication:  Family not at the bedside this morning Disposition Plan: admitted for rectal bleed, DNR but needs observation. GI and palliative to follow.  IV access:  Peripheral IV  Procedures and diagnostic studies:    Dg Chest Port 1 View 01-Mar-2014  Mild cardiac  enlargement and mild pulmonary vascular congestion. No focal consolidation or edema.     Medical Consultants:  Gastroenterology  Palliative care  Other Consultants:  Physical therapy   IAnti-Infectives:   None   Leisa Lenz, MD  [facility] Pager [phone]  If 7PM-7AM, please contact night-coverage [email or web address] Password [facility] 01-Mar-2014, 9:34 AM   LOS: 0 days    HPI/Subjective: No acute overnight events.  Objective: Filed Vitals:   03-01-2014 0850 03/01/2014 0850 03-01-14 0900 03/01/14 0902  BP: 110/58  109/68   Pulse:   86   Temp:  98.5 F (36.9 C)  98.3 F (36.8 C)  TempSrc:  Oral  Oral  Resp:   17   SpO2:   100%     Intake/Output Summary (Last 24 hours) at 2014/03/01 0934 Last data filed at Mar 01, 2014 0852  Gross per 24 hour  Intake    700 ml  Output      0 ml  Net    700 ml    Exam:   General:  Pt is not in acute distress  Cardiovascular: Regular rate and rhythm, S1/S2 (+)  Respiratory: no wheezing, no crackles, no rhonchi   Data Reviewed: Basic Metabolic Panel:  Recent Labs Lab March 01, 2014 0212  NA 139  K 4.0  CL 104  CO2 25  GLUCOSE 104*  BUN 28*  CREATININE 1.08  CALCIUM 8.8   Liver Function Tests:  Recent Labs Lab 2014/03/01 0212  AST 28  ALT 21  ALKPHOS 78  BILITOT 0.6  PROT 6.9  ALBUMIN 3.4*   No results for input(s): LIPASE, AMYLASE in the last 168 hours. No results for input(s):  AMMONIA in the last 168 hours. CBC:  Recent Labs Lab 02/27/14 0212 02/27/14 0521  WBC 17.5* 17.3*  NEUTROABS 10.4*  --   HGB 13.6 12.4*  HCT 41.9 38.1*  MCV 95.2 95.3  PLT 483* 438*   Cardiac Enzymes: No results for input(s): CKTOTAL, CKMB, CKMBINDEX, TROPONINI in the last 168 hours. BNP: Invalid input(s): POCBNP CBG: No results for input(s): GLUCAP in the last 168 hours.  No results found for this or any previous visit (from the past 240 hour(s)).   Scheduled Meds: . pantoprazole (PROTONIX) IV  40 mg Intravenous Q24H    Continuous Infusions: . sodium chloride 125 mL/hr at 02/27/14 [phone]

## 2014-02-27 NOTE — Progress Notes (Signed)
CARE MANAGEMENT NOTE 02/27/2014  Patient:  Seth Hunter, Seth Hunter   Account Number:  [id number]  Date Initiated:  02/27/2014  Documentation initiated by:  DAVIS,RHONDA  Subjective/Objective Assessment:   pt with massive rectal bleeding tachycardic,hypotensive,     Action/Plan:   home when stable   Anticipated DC Date:  03/02/2014   Anticipated DC Plan:  HOME/SELF CARE  In-house referral  NA      DC Planning Services  NA      [facility] Choice  NA   Choice offered to / List presented to:  NA      DME agency  NA        [place] agency  NA   Status of service:  In process, will continue to follow Medicare Important Message given?   (If response is "NO", the following Medicare IM given date fields will be blank) Date Medicare IM given:   Medicare IM given by:   Date Additional Medicare IM given:   Additional Medicare IM given by:    Discharge Disposition:    Per UR Regulation:  Reviewed for med. necessity/level of care/duration of stay  If discussed at [place] of Stay Meetings, dates discussed:    Comments:  Feb. 12 2016/Rhonda L. Rosana Hoes, RN, BSN, CCM/Case Management [place] [phone] No discharge needs present of time of review.

## 2014-02-27 NOTE — ED Notes (Signed)
Bed: WA07 Expected date:  Expected time:  Means of arrival:  Comments: Rectal bleed

## 2014-02-27 NOTE — H&P (Signed)
[facility] History and Physical  Keanon Bevins YOV:[id number] DOB: [birth year] DOA: 02/27/2014  Referring physician: ED physician PCP: No primary care provider on file.  Specialists:   Chief Complaint: Massive rectal bleeding  HPI: Seth Hunter is a 81 y.o. male with past medical history of COPD, hypertension, possible BPH, pacemaker placement, who presents with massive rectal bleeding.  Patient has been doing well until 5 to 6 hours ago when he started having rectal bleeding in large volume. He was brought to the emergency department by EMS. At arrival to ED, he continues to have ongoing bleeding from his rectum. No known history of diverticulosis. He denies use of anticoagulants.  He's never had this happen before. He has a documented pulse to 225, however, in ED he has a paced rhythm with HR of 100 to 120/min.   Patient denies fever, chills, cough, chest pain, SOB, abdominal pain, diarrhea, constipation, dysuria, urgency, frequency, hematuria, skin rashes or leg swelling. No unilateral weakness, numbness or tingling sensations. No vision change or hearing loss.  In ED, patient was found to have oxygen saturation 94%, tachycardia with heart rate 100 to 110,  blood pressure 95/35, leukocytosis with WBC 17.5, temperature 98.3. EKG showed a paced rhythm with QTC 503. INR 1.02. Electrolytes okay. Chest x-ray showed vascular congestion, but no pulmonary edema or infiltration. Patient is admitted to inpatient for further evaluation and treatment. GI was consulted to the ED.  Review of Systems: As presented in the history of presenting illness, rest negative.  Where does patient live?  At home Can patient participate in ADLs? little  Allergy:  Allergies  Allergen Reactions  . Penicillins Other (See Comments)    Childhood allergy    Past Medical History  Diagnosis Date  . UTI (urinary tract infection)   . COPD (chronic obstructive pulmonary disease)     Past Surgical History  Procedure  Laterality Date  . Cholecystectomy    . Fracture surgery  2014    L femur    Social History:  reports that he has never smoked. He does not have any smokeless tobacco history on file. He reports that he does not drink alcohol or use illicit drugs.  Family History: patient could not remember family medical hx.  Prior to Admission medications   Medication Sig Start Date End Date Taking? Authorizing Provider  aspirin 81 MG tablet Take 81 mg by mouth 2 (two) times daily.   Yes Historical Provider, MD  finasteride (PROSCAR) 5 MG tablet Take 5 mg by mouth daily.   Yes Historical Provider, MD  GARLIC PO Take 1 tablet by mouth daily.   Yes Historical Provider, MD  hydrochlorothiazide (HYDRODIURIL) 25 MG tablet Take 25 mg by mouth daily.   Yes Historical Provider, MD    Physical Exam: Filed Vitals:   02/27/14 0155 02/27/14 0330 02/27/14 0400 02/27/14 0500  BP: 189/50 100/58 98/59 111/40  Pulse:  225 200 101  Temp: 98.3 F (36.8 C)   98.5 F (36.9 C)  TempSrc: Oral   Oral  Resp: 18 23 17 18   SpO2: 90% 81% 66% 100%   General: Not in acute distress HEENT:       Eyes: PERRL, EOMI, no scleral icterus       ENT: No discharge from the ears and nose, no pharynx injection, no tonsillar enlargement.        Neck: No JVD, no bruit, no mass felt. Cardiac: S1/S2, RRR, No murmurs, No gallops or rubs Pulm: Good air movement bilaterally.  Clear to auscultation bilaterally. No rales, wheezing, rhonchi or rubs. Abd: Soft, nondistended, nontender, no rebound pain, no organomegaly, BS present. Gross blood slowly leaking from rectum.  Diaper filled with a large amount of clotted blood. Ext: No edema bilaterally. 2+DP/PT pulse bilaterally. Resting tremor of his bilateral hands Musculoskeletal: No joint deformities, erythema, or stiffness, ROM full Skin: No rashes.  Neuro: Alert and oriented X3, cranial nerves II-XII grossly intact, muscle strength 5/5 in all extremeties, sensation to light touch intact.   Psych: Patient is not psychotic.   Labs on Admission:  Basic Metabolic Panel:  Recent Labs Lab 02/27/14 0212  NA 139  K 4.0  CL 104  CO2 25  GLUCOSE 104*  BUN 28*  CREATININE 1.08  CALCIUM 8.8   Liver Function Tests:  Recent Labs Lab 02/27/14 0212  AST 28  ALT 21  ALKPHOS 78  BILITOT 0.6  PROT 6.9  ALBUMIN 3.4*   No results for input(s): LIPASE, AMYLASE in the last 168 hours. No results for input(s): AMMONIA in the last 168 hours. CBC:  Recent Labs Lab 02/27/14 0212  WBC 17.5*  NEUTROABS 10.4*  HGB 13.6  HCT 41.9  MCV 95.2  PLT 483*   Cardiac Enzymes: No results for input(s): CKTOTAL, CKMB, CKMBINDEX, TROPONINI in the last 168 hours.  BNP (last 3 results) No results for input(s): BNP in the last 8760 hours.  ProBNP (last 3 results) No results for input(s): PROBNP in the last 8760 hours.  CBG: No results for input(s): GLUCAP in the last 168 hours.  Radiological Exams on Admission: Dg Chest Port 1 View  02/27/2014   CLINICAL DATA:  Shortness of breath for several hr. Rectal bleeding.  EXAM: PORTABLE CHEST - 1 VIEW  COMPARISON:  None.  FINDINGS: Cardiac pacemaker lead tips over the RA and RV regions. Shallow inspiration. Mild cardiac enlargement. Mild pulmonary vascular prominence suggesting mild congestion. No focal airspace disease or consolidation. No blunting of costophrenic angles. No pneumothorax. Calcified and tortuous aorta. Degenerative changes in the spine and shoulders.  IMPRESSION: Mild cardiac enlargement and mild pulmonary vascular congestion. No focal consolidation or edema.   Electronically Signed   By: Lucienne Capers M.D.   On: 02/27/2014 02:41    EKG: Independently reviewed.   Assessment/Plan Principal Problem:   Lower GI bleed Active Problems:   Pacemaker   HTN (hypertension)   Leukocytosis  Lower GI: Etiology is not clear, but is likely due to diverticulosis given the painless feature. Other differential diagnosis may include  AVM and colon cancer. Patient is very tachycardic and with low blood pressure. Clinically he tends to become unstable. His initial Hgb was 13.6 which is likely falsely high due to lack of time to balance Hgb. Since he is still having ongoing bleeding in large volume, after discussed with Dr. Venora Maples in ED, we both agreed to transfuse patient with 2 units of blood. GI was consulted.  - will admit to SDU. - IVF: NS bolus before blood is available - GI consulted by Ed, will follow up recommendations - NPO for possible procedure - NS: 1500 bolus and followed 125 cc/h mL/hr - Start IV pantoprazole 40 mg daily - Avoid NSAIDs and SQ heparin - hold ASA and HCTZ - Maintain IV access (2 large bore IVs if possible). - Monitor closely and follow q6h cbc, transfuse as necessary. - LaB: INR, PTT, Lactate  HTN: now has low BP -hold HCTZ  Leukocytosis: WBC 17.5. No signs of infection. No fever. Chest x-ray  has no infiltration. No symptoms for UTI. Most likely due to stress secondary to GI bleeding -Check urinalysis -watch body temperature   DVT ppx: SCD   Code Status: DNR (Dr. Venora Maples discussed with patient's family) Family Communication: None at bed side.    Disposition Plan: Admit to inpatient   Date of Service 02/27/2014    Ivor Costa [facility] Pager [phone]  If 7PM-7AM, please contact night-coverage [email or web address] Password [facility] 02/27/2014, 5:26 AM

## 2014-02-27 NOTE — Consult Note (Signed)
Referring Provider: [facility] Primary Care Physician:  No PCP on file Primary Gastroenterologist:  unassugned  Reason for Consultation:  Rectal bleeding    HPI: Seth Hunter is a 81 y.o. male who was admitted through the ER this morning with rectal bleeding. Patient has a history of hypertension, BPH, pacemaker placement, and COPD. Patient reports that he was feeling well at home until 6 or so hours prior to coming to the ER when he started having rectal bleeding. He states he has someone at home who helps change his diapers and was noted to have a large amount of blood in his undergarments. Shortly thereafter he had another large bloody bowel movement and was brought to the emergency room via EMS. Upon arrival to the emergency room, he continued to have bleeding from the rectum. The patient is unaware of a prior history of diverticulosis and says he has never had a colonoscopy. He is not on anticoagulants. He says he has never had a similar episode of this bleeding in the past. He has no abdominal pain, nausea, or vomiting. He has no chest pain or shortness of breath. While in the emergency room the patient was noted to be tachycardic with a heart rate of 100-110, blood pressure 95/35, leukocytosis with white blood cell count of 17.5. Oxygen saturation was 94. Hemoglobin 13.6 platelets 483,000. BUN 28, creatinine 1.08.   Past Medical History  Diagnosis Date  . UTI (urinary tract infection)   . COPD (chronic obstructive pulmonary disease)     Past Surgical History  Procedure Laterality Date  . Cholecystectomy    . Fracture surgery  2014    L femur    Prior to Admission medications   Medication Sig Start Date End Date Taking? Authorizing Provider  aspirin 81 MG tablet Take 81 mg by mouth 2 (two) times daily.   Yes Historical Provider, MD  finasteride (PROSCAR) 5 MG tablet Take 5 mg by mouth daily.   Yes Historical Provider, MD  GARLIC PO Take 1 tablet by mouth daily.   Yes  Historical Provider, MD  hydrochlorothiazide (HYDRODIURIL) 25 MG tablet Take 25 mg by mouth daily.   Yes Historical Provider, MD    Current Facility-Administered Medications  Medication Dose Route Frequency Provider Last Rate Last Dose  . 0.9 %  sodium chloride infusion   Intravenous Continuous Robbie Lis, MD 125 mL/hr at 02/27/14 [phone]    . antiseptic oral rinse (CPC / CETYLPYRIDINIUM CHLORIDE 0.05%) solution 7 mL  7 mL Mouth Rinse q12n4p Robbie Lis, MD      . chlorhexidine (PERIDEX) 0.12 % solution 15 mL  15 mL Mouth Rinse BID Robbie Lis, MD      . finasteride (PROSCAR) tablet 5 mg  5 mg Oral Daily Ivor Costa, MD   5 mg at 02/27/14 1045  . pantoprazole (PROTONIX) injection 40 mg  40 mg Intravenous Q24H Ivor Costa, MD   40 mg at 02/27/14 0956  . sodium chloride 0.9 % injection 3 mL  3 mL Intravenous Q12H Ivor Costa, MD   3 mL at 02/27/14 1050    Allergies as of 02/27/2014 - Review Complete 02/27/2014  Allergen Reaction Noted  . Penicillins Other (See Comments) 02/27/2014    No family history on file.  History   Social History  . Marital Status: Single    Spouse Name: N/A  . Number of Children: N/A  . Years of Education: N/A   Occupational History  . Not on file.  Social History Main Topics  . Smoking status: Never Smoker   . Smokeless tobacco: Not on file  . Alcohol Use: No  . Drug Use: No  . Sexual Activity: Not on file   Other Topics Concern  . Not on file   Social History Narrative  . No narrative on file    Review of Systems: Gen: Denies any fever, chills, sweats, anorexia, fatigue, weakness, malaise, weight loss, and sleep disorder CV: Denies chest pain, angina, palpitations, syncope, orthopnea, PND, peripheral edema, and claudication. Resp: Denies dyspnea at rest, dyspnea with exercise, cough, sputum, wheezing, coughing up blood, and pleurisy. GI: Denies vomiting blood, jaundice, and fecal incontinence.   Denies dysphagia or odynophagia. GU : Denies  urinary burning, blood in urine, urinary frequency, urinary hesitancy, nocturnal urination, and urinary incontinence. MS: Denies joint pain, limitation of movement, and swelling, stiffness, low back pain, extremity pain. Denies muscle weakness, cramps, atrophy.  Derm: Denies rash, itching, dry skin, hives, moles, warts, or unhealing ulcers.  Psych: Denies depression, anxiety, memory loss, suicidal ideation, hallucinations, paranoia, and confusion. Heme: Denies bruising, bleeding, and enlarged lymph nodes. Neuro:  Denies any headaches, dizziness, paresthesias. Endo:  Denies any problems with DM, thyroid, adrenal function.  Physical Exam: Vital signs in last 24 hours: Temp:  [98.3 F (36.8 C)-98.5 F (36.9 C)] 98.4 F (36.9 C) (02/12 1055) Pulse Rate:  [76-225] 76 (02/12 1055) Resp:  [16-23] 21 (02/12 1055) BP: (98-254)/(30-215) 254/215 mmHg (02/12 1055) SpO2:  [66 %-100 %] 100 % (02/12 1055)   General:   Alert,  Well-developed, well-nourished, pleasant and cooperative in NAD Head:  Normocephalic and atraumatic. Eyes:  Sclera clear, no icterus.   Conjunctiva pink. Ears:  Normal auditory acuity. Nose:  No deformity, discharge,  or lesions. Mouth:  No deformity or lesions.   Neck:  Supple; no masses or thyromegaly. Lungs:  Clear throughout to auscultation.   No wheezes, crackles, or rhonchi . Heart:  Regular rate and rhythm; no murmurs, clicks, rubs,  or gallops. Abdomen:  Soft,nontender, BS active,nonpalp mass or hsm.   Rectal: bright red blood around anus Msk:  Symmetrical without gross deformities. . Pulses:  Normal pulses noted. Extremities:  Without clubbing or edema. Neurologic:  Alert and  oriented x4;  grossly normal neurologically. Skin:  Intact without significant lesions or rashes.. Psych:  Alert and cooperative. Normal mood and affect.  Intake/Output from previous day: 02/11 0701 - 02/12 0700 In: 350 [Blood:350] Out: -  Intake/Output this shift: Total I/O In: 703  [I.V.:3; Blood:700] Out: -   Lab Results:  Recent Labs  02/27/14 0212 02/27/14 0521  WBC 17.5* 17.3*  HGB 13.6 12.4*  HCT 41.9 38.1*  PLT 483* 438*   BMET  Recent Labs  02/27/14 0212  NA 139  K 4.0  CL 104  CO2 25  GLUCOSE 104*  BUN 28*  CREATININE 1.08  CALCIUM 8.8   LFT  Recent Labs  02/27/14 0212  PROT 6.9  ALBUMIN 3.4*  AST 28  ALT 21  ALKPHOS 78  BILITOT 0.6   PT/INR  Recent Labs  02/27/14 0212  LABPROT 13.5  INR 1.02        Studies/Results: Dg Chest Port 1 View  02/27/2014   CLINICAL DATA:  Shortness of breath for several hr. Rectal bleeding.  EXAM: PORTABLE CHEST - 1 VIEW  COMPARISON:  None.  FINDINGS: Cardiac pacemaker lead tips over the RA and RV regions. Shallow inspiration. Mild cardiac enlargement. Mild pulmonary vascular prominence suggesting mild  congestion. No focal airspace disease or consolidation. No blunting of costophrenic angles. No pneumothorax. Calcified and tortuous aorta. Degenerative changes in the spine and shoulders.  IMPRESSION: Mild cardiac enlargement and mild pulmonary vascular congestion. No focal consolidation or edema.   Electronically Signed   By: Lucienne Capers M.D.   On: 02/27/2014 02:41    IMPRESSION/PLAN: 81 year old male admitted with painless rectal bleed, likely diverticular in nature. Patient is currently tachycardic and hypotensive and needs to be stabilized before any procedures would be done for further evaluation. He had 2 units of blood ordered in the emergency room has his initial hemoglobin of 13.6 was likely falsely high due to lack of cooperation. Patient is to remain nothing by mouth. Continue IV PPI. Hold NSAIDs/heparin. Follow CBC closely. If bleeding fails to diminish, will obtain bleeding scan.  Patient noted to have elevation of white blood cell count but is afebrile with a normal chest x-ray. Elevated WBC may be stress related. Will monitor   Lavaris Sexson, Deloris Ping 02/27/2014,  Pager  [phone]

## 2014-02-27 NOTE — ED Notes (Addendum)
Per EMS, pt from home, reports right red blood in his stool.  Pt is incontinent, wears briefs. Denies any pain or n/v at this time.  Received 352ml en route.  Pt is A&O x 4.

## 2014-02-27 NOTE — Progress Notes (Signed)
PT Cancellation Note  Patient Details Name: Seth Hunter MRN: [id number] DOB: 1918-05-11   Cancelled Treatment:    Reason Eval/Treat Not Completed: Medical issues which prohibited therapy (note  patient  has strict bedrest orders, spoke to  RN who reports pt  has had bleeding per rectum. Will need  mobility orders  / Bedrest discontinued for PT to proceed with eval.willcheck tomorrow for  Activity orders.   Claretha Cooper 02/27/2014, 2:25 PM Tresa Endo PT [phone]

## 2014-02-27 NOTE — ED Notes (Signed)
Pt had large bright red stool.

## 2014-02-28 DIAGNOSIS — D72829 Elevated white blood cell count, unspecified: Secondary | ICD-10-CM

## 2014-02-28 LAB — CBC
HCT: 36.8 % — ABNORMAL LOW (ref 39.0–52.0)
HEMOGLOBIN: 12.2 g/dL — AB (ref 13.0–17.0)
MCH: 30.9 pg (ref 26.0–34.0)
MCHC: 33.2 g/dL (ref 30.0–36.0)
MCV: 93.2 fL (ref 78.0–100.0)
PLATELETS: 384 10*3/uL (ref 150–400)
RBC: 3.95 MIL/uL — ABNORMAL LOW (ref 4.22–5.81)
RDW: 14.9 % (ref 11.5–15.5)
WBC: 11.8 10*3/uL — AB (ref 4.0–10.5)

## 2014-02-28 LAB — COMPREHENSIVE METABOLIC PANEL
ALT: 19 U/L (ref 0–53)
AST: 24 U/L (ref 0–37)
Albumin: 2.8 g/dL — ABNORMAL LOW (ref 3.5–5.2)
Alkaline Phosphatase: 71 U/L (ref 39–117)
Anion gap: 5 (ref 5–15)
BUN: 27 mg/dL — ABNORMAL HIGH (ref 6–23)
CO2: 25 mmol/L (ref 19–32)
CREATININE: 0.84 mg/dL (ref 0.50–1.35)
Calcium: 8.1 mg/dL — ABNORMAL LOW (ref 8.4–10.5)
Chloride: 110 mmol/L (ref 96–112)
GFR calc Af Amer: 85 mL/min — ABNORMAL LOW (ref >90)
GFR calc non Af Amer: 73 mL/min — ABNORMAL LOW (ref >90)
GLUCOSE: 87 mg/dL (ref 70–99)
POTASSIUM: 4 mmol/L (ref 3.5–5.1)
Sodium: 140 mmol/L (ref 135–145)
Total Bilirubin: 0.9 mg/dL (ref 0.3–1.2)
Total Protein: 5.7 g/dL — ABNORMAL LOW (ref 6.0–8.3)

## 2014-02-28 LAB — TSH: TSH: 5.094 u[IU]/mL — AB (ref 0.350–4.500)

## 2014-02-28 LAB — GLUCOSE, CAPILLARY: Glucose-Capillary: 88 mg/dL (ref 70–99)

## 2014-02-28 MED ORDER — PANTOPRAZOLE SODIUM 40 MG PO TBEC
40.0000 mg | DELAYED_RELEASE_TABLET | Freq: Every day | ORAL | Status: DC
  Administered 2014-03-01: 40 mg via ORAL
  Filled 2014-02-28: qty 1

## 2014-02-28 MED ORDER — HYDROCOD POLST-CHLORPHEN POLST 10-8 MG/5ML PO LQCR
5.0000 mL | Freq: Once | ORAL | Status: AC
  Administered 2014-02-28: 5 mL via ORAL
  Filled 2014-02-28: qty 5

## 2014-02-28 NOTE — Progress Notes (Signed)
Daily Rounding Note  02/28/2014, 10:20 AM  LOS: 1 day   SUBJECTIVE:       Soft diet ordered today. No stools or bleeding.  C/o non-productive cough.  Not SOB.  Other family members also have cough. bil ankle pain is now resolved  OBJECTIVE:         Vital signs in last 24 hours:    Temp:  [98 F (36.7 C)-98.9 F (37.2 C)] 98.1 F (36.7 C) (02/13 0400) Pulse Rate:  [74-126] 87 (02/13 0800) Resp:  [13-23] 18 (02/13 0500) BP: (107-254)/(38-215) 119/49 mmHg (02/13 0800) SpO2:  [83 %-100 %] 96 % (02/13 0800) Weight:  [192 lb 0.3 oz (87.1 kg)] 192 lb 0.3 oz (87.1 kg) (02/13 0400) Last BM Date: 02/27/14 Filed Weights   02/28/14 0400  Weight: 192 lb 0.3 oz (87.1 kg)   General: elderly,  Tremulous.  Comfortable alert   Heart: RRR.  No mrg Chest: clear bil in front.  + cough.  No dyspnea Abdomen: soft, NT, ND.  No mass or HSM  Extremities: no CCE Neuro/Psych:  Oriented to year, self, place.  Extremely HOH.  Intake/Output from previous day: 02/12 0701 - 02/13 0700 In: 2372.2 [P.O.:960; I.V.:712.2; Blood:700] Out: 1450 [Urine:1450]  Intake/Output this shift: Total I/O In: 100 [I.V.:100] Out: -   Lab Results:  Recent Labs  02/27/14 0521 02/27/14 1300 02/28/14 0846  WBC 17.3* 14.5* 11.8*  HGB 12.4* 12.7* 12.2*  HCT 38.1* 38.0* 36.8*  PLT 438* 378 384   BMET  Recent Labs  02/27/14 0212 02/28/14 0500  NA 139 140  K 4.0 4.0  CL 104 110  CO2 25 25  GLUCOSE 104* 87  BUN 28* 27*  CREATININE 1.08 0.84  CALCIUM 8.8 8.1*   LFT  Recent Labs  02/27/14 0212 02/28/14 0500  PROT 6.9 5.7*  ALBUMIN 3.4* 2.8*  AST 28 24  ALT 21 19  ALKPHOS 78 71  BILITOT 0.6 0.9   PT/INR  Recent Labs  02/27/14 0212  LABPROT 13.5  INR 1.02   Hepatitis Panel No results for input(s): HEPBSAG, HCVAB, HEPAIGM, HEPBIGM in the last 72 hours.  Studies/Results: Dg Chest Port 1 View  02/27/2014   CLINICAL DATA:   Shortness of breath for several hr. Rectal bleeding.  EXAM: PORTABLE CHEST - 1 VIEW  COMPARISON:  None.  FINDINGS: Cardiac pacemaker lead tips over the RA and RV regions. Shallow inspiration. Mild cardiac enlargement. Mild pulmonary vascular prominence suggesting mild congestion. No focal airspace disease or consolidation. No blunting of costophrenic angles. No pneumothorax. Calcified and tortuous aorta. Degenerative changes in the spine and shoulders.  IMPRESSION: Mild cardiac enlargement and mild pulmonary vascular congestion. No focal consolidation or edema.   Electronically Signed   By: Lucienne Capers M.D.   On: 02/27/2014 02:41   Scheduled Meds: . antiseptic oral rinse  7 mL Mouth Rinse q12n4p  . chlorhexidine  15 mL Mouth Rinse BID  . finasteride  5 mg Oral Daily  . pantoprazole (PROTONIX) IV  40 mg Intravenous Q24H  . sodium chloride  3 mL Intravenous Q12H   Continuous Infusions: . sodium chloride 50 mL/hr at 02/28/14 0700   PRN Meds:.  ASSESMENT:   *  Painless rectal bleeding. 81 ASA on hold.    *  S/p 2 units PRBCs due to symptoms of tachycardia Hgb only dropped to 12.4 however was tachy and hypotensive in setting of LGIB. Hgb stable with the transfusions.   *  Hx cardiac pacemaker.   *  Hearing loss.    PLAN   *  Observation, supportive care.  Planning transfer to floor.  Does he need tele? *  Cbc in AM. Switch to po Protonix (empiric, not on at home)     Azucena Freed  02/28/2014, 10:20 AM Pager: [phone]

## 2014-02-28 NOTE — Progress Notes (Addendum)
Progress Note   [name] EQA:[id number] DOB: [birth year] DOA: 02/27/2014 PCP: Elsie Stain, MD   Brief Narrative:   Seth Hunter is an 81 y.o. male with a PMH of COPD, hypertension, BPH, pacemaker who was admitted 02/27/14 with large volume rectal bleeding.  Assessment/Plan:   Principal Problem:   Lower GI bleed / probable diverticular hemorrhage  Thought to be diverticular in the setting of treatment with aspirin.  Aspirin discontinued.  S/P 2 units of PRBCs. Posttransfusion hemoglobin stable.  Transfer to floor.  Continue IV PPI.  Consider bleeding scan if hemoglobin continues to drop and the patient continues to have significant rectal bleeding.  GI following.  Continue clear liquids.  Active Problems:   Pacemaker  Paced rhythm on 12-lead EKG.    HTN (hypertension)  HCTZ on hold.    Leukocytosis  Likely a stress reaction. No evidence of infection.    DVT Prophylaxis  SCDs only.  Code Status: DNR Family Communication: No family at the bedside.  Niece, [name] updated by telephone. Disposition Plan: From home.  ? Safe to return.  Has family support and niece reports that someone goes to check on him every hour & provides his meals.  PT/OT evaluations requested to determine disposition.   IV Access:    Peripheral IV   Procedures and diagnostic studies:   Dg Chest Port 1 View 02/27/2014: Mild cardiac enlargement and mild pulmonary vascular congestion. No focal consolidation or edema.     Medical Consultants:    Dr. Delfin Edis, GI.  Anti-Infectives:    None.  Subjective:    Seth Hunter is pleasantly confused.  Reports a cough and some dyspnea.  Hungry.  RN reports no further rectal bleeding.  Has a condom cath on.  Objective:    Filed Vitals:   02/28/14 0222 02/28/14 0400 02/28/14 0500 02/28/14 0800  BP: 117/56 124/85  119/49  Pulse: 88 91 77 87  Temp:  98.1 F (36.7 C)    TempSrc:  Oral    Resp: 23 21 18    Weight:  87.1 kg (192 lb  0.3 oz)    SpO2: 97% 97% 96% 96%    Intake/Output Summary (Last 24 hours) at 02/28/14 1017 Last data filed at 02/28/14 0900  Gross per 24 hour  Intake 2122.17 ml  Output   1450 ml  Net 672.17 ml    Exam: Gen:  NAD, confused, resting tremor bilateral hands Cardiovascular:  RRR, No M/R/G Respiratory:  Lungs CTAB Gastrointestinal:  Abdomen soft, NT/ND, + BS Extremities:  No C/E/C   Data Reviewed:    Labs: Basic Metabolic Panel:  Recent Labs Lab 02/27/14 0212 02/28/14 0500  NA 139 140  K 4.0 4.0  CL 104 110  CO2 25 25  GLUCOSE 104* 87  BUN 28* 27*  CREATININE 1.08 0.84  CALCIUM 8.8 8.1*   GFR CrCl cannot be calculated (Unknown ideal weight.). Liver Function Tests:  Recent Labs Lab 02/27/14 0212 02/28/14 0500  AST 28 24  ALT 21 19  ALKPHOS 78 71  BILITOT 0.6 0.9  PROT 6.9 5.7*  ALBUMIN 3.4* 2.8*   Coagulation profile  Recent Labs Lab 02/27/14 0212  INR 1.02    CBC:  Recent Labs Lab 02/27/14 0212 02/27/14 0521 02/27/14 1300 02/28/14 0846  WBC 17.5* 17.3* 14.5* 11.8*  NEUTROABS 10.4*  --   --   --   HGB 13.6 12.4* 12.7* 12.2*  HCT 41.9 38.1* 38.0* 36.8*  MCV 95.2 95.3 93.8 93.2  PLT 483* 438* 378 384   Sepsis Labs:  Recent Labs Lab 02/27/14 0212 02/27/14 0521 02/27/14 1300 02/27/14 1328 02/28/14 0846  WBC 17.5* 17.3* 14.5*  --  11.8*  LATICACIDVEN  --   --   --  0.8  --    Microbiology Recent Results (from the past 240 hour(s))  MRSA PCR Screening     Status: None   Collection Time: 02/27/14  1:29 PM  Result Value Ref Range Status   MRSA by PCR NEGATIVE NEGATIVE Final    Comment:        The GeneXpert MRSA Assay (FDA approved for NASAL specimens only), is one component of a comprehensive MRSA colonization surveillance program. It is not intended to diagnose MRSA infection nor to guide or monitor treatment for MRSA infections.      Medications:   . antiseptic oral rinse  7 mL Mouth Rinse q12n4p  . chlorhexidine  15  mL Mouth Rinse BID  . finasteride  5 mg Oral Daily  . pantoprazole (PROTONIX) IV  40 mg Intravenous Q24H  . sodium chloride  3 mL Intravenous Q12H   Continuous Infusions: . sodium chloride 50 mL/hr at 02/28/14 0700    Time spent: 25 minutes.   LOS: 1 day   [name]  [facility] Pager [phone]. If unable to reach me by pager, please call my cell phone at [phone].  *Please refer to [email or web address], password [email or web address] to get updated schedule on who will round on this patient, as hospitalists switch teams weekly. If 7PM-7AM, please contact night-coverage at [email or web address], password [email or web address] for any overnight needs.  02/28/2014, 10:17 AM

## 2014-02-28 NOTE — Progress Notes (Signed)
PT Cancellation Note  Patient Details Name: Seth Hunter MRN: [id number] DOB: 03-Jan-1919   Cancelled Treatment:    Reason Eval/Treat Not Completed: Medical issues which prohibited therapy--pt remains on strict bedrest in order set. Please update activity level status for participation with PT. Thanks.    Weston Anna, MPT Pager: [phone]

## 2014-03-01 LAB — BASIC METABOLIC PANEL
ANION GAP: 6 (ref 5–15)
BUN: 23 mg/dL (ref 6–23)
CALCIUM: 8.7 mg/dL (ref 8.4–10.5)
CHLORIDE: 108 mmol/L (ref 96–112)
CO2: 25 mmol/L (ref 19–32)
CREATININE: 1.04 mg/dL (ref 0.50–1.35)
GFR, EST AFRICAN AMERICAN: 69 mL/min — AB (ref >90)
GFR, EST NON AFRICAN AMERICAN: 60 mL/min — AB (ref >90)
GLUCOSE: 118 mg/dL — AB (ref 70–99)
Potassium: 3.9 mmol/L (ref 3.5–5.1)
Sodium: 139 mmol/L (ref 135–145)

## 2014-03-01 LAB — TYPE AND SCREEN
ABO/RH(D): AB POS
ANTIBODY SCREEN: NEGATIVE
UNIT DIVISION: 0
UNIT DIVISION: 0

## 2014-03-01 LAB — CBC
HCT: 35.8 % — ABNORMAL LOW (ref 39.0–52.0)
HEMATOCRIT: 39.7 % (ref 39.0–52.0)
HEMOGLOBIN: 11.8 g/dL — AB (ref 13.0–17.0)
HEMOGLOBIN: 13.3 g/dL (ref 13.0–17.0)
MCH: 30.9 pg (ref 26.0–34.0)
MCH: 31.7 pg (ref 26.0–34.0)
MCHC: 33 g/dL (ref 30.0–36.0)
MCHC: 33.5 g/dL (ref 30.0–36.0)
MCV: 93.7 fL (ref 78.0–100.0)
MCV: 94.7 fL (ref 78.0–100.0)
Platelets: 348 10*3/uL (ref 150–400)
Platelets: 390 10*3/uL (ref 150–400)
RBC: 3.82 MIL/uL — AB (ref 4.22–5.81)
RBC: 4.19 MIL/uL — AB (ref 4.22–5.81)
RDW: 14.9 % (ref 11.5–15.5)
RDW: 14.9 % (ref 11.5–15.5)
WBC: 10.8 10*3/uL — ABNORMAL HIGH (ref 4.0–10.5)
WBC: 13 10*3/uL — AB (ref 4.0–10.5)

## 2014-03-01 LAB — GLUCOSE, CAPILLARY: GLUCOSE-CAPILLARY: 87 mg/dL (ref 70–99)

## 2014-03-01 NOTE — Evaluation (Addendum)
Physical Therapy Evaluation Patient Details Name: Seth Hunter MRN: [id number] DOB: 1918/05/04 Today's Date: 03/01/2014   History of Present Illness  Seth Hunter is a 81 y.o. male who was admitted through the ER 02/27/14  with rectal bleeding. Patient has a history of hypertension, BPH, pacemaker placement, and COPD. Patient has a history of hypertension, BPH, pacemaker placement, and COPD. s/p transfusions of blood during admission.  Clinical Impression  Patient is very hard of hearing, unable to get info related to caregiver availability, no family present. Previous MD note and RN reports patient lives alone and family checks on him frequently.. At this time patient is requiring moderate assist for standing and has not ambulated. Pt does report that he has a 4 wheeled RW.  Patient  Will require 24/7 caregivers until he returns to his baseline. Patient could benefit from [facility] rehab.  Patient will benefit from PT to address problems listed in note below.    Follow Up Recommendations SNF;Supervision/Assistance - 24 hour (no family present to discuss  24/7 availability)HHPT if has caregiver support.    Equipment Recommendations  None recommended by PT    Recommendations for Other Services       Precautions / Restrictions Precautions Precautions: Fall Restrictions Weight Bearing Restrictions: No      Mobility  Bed Mobility Overal bed mobility: Needs Assistance Bed Mobility: Supine to Sit     Supine to sit: Min assist;HOB elevated     General bed mobility comments: extra time to get upright, use of rails  Transfers Overall transfer level: Needs assistance Equipment used: Rolling walker (2 wheeled) Transfers: Sit to/from [facility] Sit to Stand: Max assist;From elevated surface Stand pivot transfers: Mod assist       General transfer comment: 2 trials to stand from bed, pivot steps to recliner,  barely made the turn before sitting down.  Ambulation/Gait              General Gait Details: NT at this time as patient  barely sttod to pivot.  Stairs            Wheelchair Mobility    Modified Rankin (Stroke Patients Only)       Balance Overall balance assessment: Needs assistance Sitting-balance support: Feet supported;Bilateral upper extremity supported Sitting balance-Leahy Scale: Fair   Postural control: Posterior lean Standing balance support: During functional activity;Bilateral upper extremity supported Standing balance-Leahy Scale: Poor                               Pertinent Vitals/Pain Pain Assessment: No/denies pain    Home Living Family/patient expects to be discharged to:: Private residence Living Arrangements: Alone             Home Equipment: Walker - 4 wheels Additional Comments: pt is too HOH to get info, no family present, RN states patien lives alone and niece checks on him.    Prior Function Level of Independence: Independent with assistive device(s)               Hand Dominance        Extremity/Trunk Assessment   Upper Extremity Assessment:  (patient unable to  fed self due to tremors, abble to drink from covered cup w/ straw.)           Lower Extremity Assessment: Generalized weakness      Cervical / Trunk Assessment: Normal  Communication   Communication: HOH (oriented to hospital  and that it may snow.)  Cognition Arousal/Alertness: Awake/alert Behavior During Therapy: WFL for tasks assessed/performed Overall Cognitive Status: Difficult to assess                      General Comments      Exercises        Assessment/Plan    PT Assessment Patient needs continued PT services  PT Diagnosis Difficulty walking;Generalized weakness   PT Problem List Decreased strength;Decreased activity tolerance;Decreased balance;Decreased mobility;Decreased knowledge of precautions;Decreased safety awareness;Decreased knowledge of use of DME  PT Treatment  Interventions DME instruction;Gait training;Functional mobility training;Therapeutic activities;Therapeutic exercise;Patient/family education   PT Goals (Current goals can be found in the Care Plan section) Acute Rehab PT Goals PT Goal Formulation: Patient unable to participate in goal setting Time For Goal Achievement: 03/15/14 Potential to Achieve Goals: Good    Frequency Min 3X/week   Barriers to discharge Decreased caregiver support      Co-evaluation               End of Session Equipment Utilized During Treatment: Gait belt Activity Tolerance: Patient tolerated treatment well Patient left: in chair;with call bell/phone within reach;with chair alarm set Nurse Communication: Mobility status         Time: 6606-3016 PT Time Calculation (min) (ACUTE ONLY): 35 min   Charges:   PT Evaluation $Initial PT Evaluation Tier I: 1 Procedure PT Treatments $Therapeutic Activity: 8-22 mins   PT G Codes:        Claretha Cooper 03/01/2014, 9:33 AM Tresa Endo PT [phone]

## 2014-03-01 NOTE — Progress Notes (Signed)
          Daily Rounding Note  03/01/2014, 10:13 AM  LOS: 2 days   SUBJECTIVE:       Eating well, no complaints.  Smear of non-bloody stool overnight.   OBJECTIVE:         Vital signs in last 24 hours:    Temp:  [98 F (36.7 C)-99 F (37.2 C)] 99 F (37.2 C) (02/14 0400) Pulse Rate:  [74-89] 80 (02/14 0800) Resp:  [11-27] 19 (02/14 0800) BP: (88-150)/(39-85) 119/80 mmHg (02/14 0800) SpO2:  [92 %-98 %] 97 % (02/14 0800) Weight:  [191 lb 12.8 oz (87 kg)] 191 lb 12.8 oz (87 kg) (02/14 0400) Last BM Date: 03/01/14 Filed Weights   02/28/14 0400 03/01/14 0400  Weight: 192 lb 0.3 oz (87.1 kg) 191 lb 12.8 oz (87 kg)   General: aged, NAD, frail.   Heart: RRR Chest: some fine rales in base, slightly diminished sounds on left base.  No dyspnea Abdomen: soft, NT, ND.  BS active  Extremities: no CCE Neuro/Psych:  Pleasant, appropriated, very HOH  Intake/Output from previous day: 02/13 0701 - 02/14 0700 In: 1183.3 [P.O.:80; I.V.:1103.3] Out: 2020 [Urine:2020]  Intake/Output this shift:    Lab Results:  Recent Labs  02/27/14 1300 02/28/14 0846 03/01/14 0414  WBC 14.5* 11.8* 10.8*  HGB 12.7* 12.2* 11.8*  HCT 38.0* 36.8* 35.8*  PLT 378 384 348   BMET  Recent Labs  02/27/14 0212 02/28/14 0500  NA 139 140  K 4.0 4.0  CL 104 110  CO2 25 25  GLUCOSE 104* 87  BUN 28* 27*  CREATININE 1.08 0.84  CALCIUM 8.8 8.1*   LFT  Recent Labs  02/27/14 0212 02/28/14 0500  PROT 6.9 5.7*  ALBUMIN 3.4* 2.8*  AST 28 24  ALT 21 19  ALKPHOS 78 71  BILITOT 0.6 0.9   PT/INR  Recent Labs  02/27/14 0212  LABPROT 13.5  INR 1.02   Hepatitis Panel No results for input(s): HEPBSAG, HCVAB, HEPAIGM, HEPBIGM in the last 72 hours.  Studies/Results: No results found.  ASSESMENT:   * Painless rectal bleeding. 81 ASA on hold.   * S/p 2 units PRBCs 02/27/14 due to symptoms of tachycardia Hgb only dropped to 12.4 however was  tachy and hypotensive in setting of LGIB. Hgb stable with the transfusions.  Hemodynamically stable for >24 hours.   * Hx cardiac pacemaker.   * Hearing loss.    PLAN   *   Note discharge summary written and plans for discharge today. .   *  GI follow up PRN.   Dr Olevia Perches is GU primary of record. Regular diet.  *  Would not resume bid 81 mg ASA.  If resumed, once per day and resume ~ 10 days on 03/11/14.     Azucena Freed  03/01/2014, 10:13 AM Pager: [phone]

## 2014-03-01 NOTE — Progress Notes (Signed)
Occupational Therapy Evaluation Patient Details Name: Seth Hunter MRN: [id number] DOB: 1918-10-26 Today's Date: 03/01/2014    History of Present Illness Seth Hunter is a 81 y.o. male who was admitted through the ER 2/1`2/16  with rectal bleeding. Patient has a history of hypertension, BPH, pacemaker placement, and COPDJohn Hunter is a 81 y.o. male who was admitted through the ER this morning with rectal bleeding. Patient has a history of hypertension, BPH, pacemaker placement, and COPD s/p transfusions of blood,   Clinical Impression   Family called regarding PLOF. Pt with decline in function at this time. Educated niece on techniques to use to compensate for increased weakness. Niece very Patent attorney. Recommend D/C home with family with 24/7 assistance and HHOT/PT. Family prefers [place].  No new DME needs. Family in agreement. Text page sent to Dr. Rockne Menghini asking for [facility]. All further OT to be addressed by [facility].     Follow Up Recommendations  Home health OT;Supervision/Assistance - 24 hour    Equipment Recommendations  None recommended by OT    Recommendations for Other Services       Precautions / Restrictions Precautions Precautions: Fall Restrictions Weight Bearing Restrictions: No      Mobility Bed Mobility Overal bed mobility: Needs Assistance Bed Mobility: Supine to Sit     Supine to sit: Min assist;HOB elevated     General bed mobility comments: pt up in chair  Transfers Overall transfer level: Needs assistance Equipment used: Rolling walker (2 wheeled);1 person hand held assist Transfers: Sit to/from Stand Sit to Stand: Mod assist Stand pivot transfers: Mod assist       General transfer comment: 2 trials to stand from bed, pivot steps to recliner,  barely made the turn before sitting down.    Balance Overall balance assessment: Needs assistance Sitting-balance support: Feet supported Sitting balance-Leahy Scale: Fair   Postural control: Posterior  lean Standing balance support: During functional activity Standing balance-Leahy Scale: Poor                              ADL Overall ADL's : Needs assistance/impaired                                     Functional mobility during ADLs: Moderate assistance General ADL Comments: At baseline, pt requires assistance with ADL, however, is able to perform some of his ADL per niece. Pt requires increased assistance with mobility for ADL.  Recommended family use gait belt given pt's decreased ability /balance with funcitonal mobility. Also recommended using RW for ambulation and following with 4 wheeled RW for pt to sit as pt tends to sit unexpectedly at times. Also recommended to increase height of walkers to increase posture with mobility. Family verbalized understanding.                     Pertinent Vitals/Pain Pain Assessment: No/denies pain     Hand Dominance     Extremity/Trunk Assessment Upper Extremity Assessment Upper Extremity Assessment: Generalized weakness   Lower Extremity Assessment Lower Extremity Assessment: Generalized weakness   Cervical / Trunk Assessment Cervical / Trunk Assessment: Kyphotic   Communication Communication Communication: HOH   Cognition Arousal/Alertness: Awake/alert Behavior During Therapy: WFL for tasks assessed/performed Overall Cognitive Status: No family/caregiver present to determine baseline cognitive functioning  General Comments       Exercises Exercises: Other exercises Other Exercises Other Exercises: encouraged BUE general strengthening exercises with family   Shoulder Instructions      Home Living Family/patient expects to be discharged to:: Private residence Living Arrangements: Alone Available Help at Discharge: Family;Available PRN/intermittently Type of Home: House       Home Layout: One level               Home Equipment: [place] - 4  wheels;Bedside commode; RW        Prior Functioning/Environment Level of Independence: Needs assistance  Gait / Transfers Assistance Needed: family assists. Pt uses 4 wheeled RW and family supervises ADL's / Homemaking Assistance Needed: family assists with ADL        OT Diagnosis: Generalized weakness   OT Problem List: Decreased strength;Decreased activity tolerance;Impaired balance (sitting and/or standing);Decreased safety awareness   OT Treatment/Interventions:      OT Goals(Current goals can be found in the care plan section) Acute Rehab OT Goals Patient Stated Goal: to go home OT Goal Formulation: All assessment and education complete, DC therapy   End of Session Equipment Utilized During Treatment: Gait belt;Rolling walker Nurse Communication: Mobility status;Other (comment) (D/C needs)  Activity Tolerance: Patient tolerated treatment well Patient left: in chair;with call bell/phone within reach;with chair alarm set;with family/visitor present   Time: 1005-1035 OT Time Calculation (min): 30 min Charges:  OT General Charges $OT Visit: 1 Procedure OT Evaluation $Initial OT Evaluation Tier I: 1 Procedure OT Treatments $Self Care/Home Management : 8-22 mins G-Codes:    Binnie Droessler,HILLARY 2014-03-30, 10:43 AM   Maurie Boettcher, OTR/L  [phone] 03-30-2014

## 2014-03-01 NOTE — Discharge Summary (Addendum)
Physician Discharge Summary  Seth Hunter XAJ:[id number] DOB: 03-22-1918 DOA: 03-21-14  PCP: Elsie Stain, MD  Admit date: 2014-03-21 Discharge date: 03/01/2014   Recommendations for Outpatient Follow-Up:   1. Home PT set up prior to D/C. 2. Recommend repeat CBC in 1 week.  TSH mildly elevated. Consider further thyroid testing if deemed appropriate by PCP. 3. Recommend outpatient referral to GI if deemed appropriate by PCP for consideration of non-emergent colonoscopy.   Discharge Diagnosis:   Principal Problem:    Lower GI bleed, probable diverticular Active Problems:    Pacemaker    HTN (hypertension)    Leukocytosis    Elevated TSH    Generalized weakness   Discharge Condition: Improved.  Diet recommendation: Low sodium, heart healthy.    History of Present Illness:   Seth Hunter is an 81 y.o. male with a PMH of COPD, hypertension, BPH, pacemaker who was admitted 03/21/14 with large volume rectal bleeding.  Hospital Course by Problem:   Principal Problem:  Lower GI bleed / probable diverticular hemorrhage  Thought to be diverticular in the setting of treatment with aspirin. Aspirin discontinued.  The patient's niece was instructed to hold aspirin for 1 week, and to resume it then if there is no further blood in the stools.  S/P 2 units of PRBCs. Posttransfusion hemoglobin stable x 48 hours with no recurrent rectal bleeding.   Diet successfully advanced.    Evaluated by GI with no further plans for inpatient evaluation.    Active Problems:   Generalized weakness  Physical therapy set up.   Pacemaker  Paced rhythm on 12-lead EKG.   HTN (hypertension)  HCTZ on hold.  OK to resume post-discharge.   Leukocytosis  Likely a stress reaction. No evidence of infection.    Medical Consultants:    Dr. Delfin Edis, GI.   Discharge Exam:   Filed Vitals:   03/01/14 0600  BP: 96/55  Pulse: 74  Temp:   Resp: 16   Filed Vitals:   03/01/14 0300 03/01/14 0400 03/01/14 0423 03/01/14 0600  BP:  102/42 112/60 96/55  Pulse:  78 80 74  Temp:  99 F (37.2 C)    TempSrc:  Axillary    Resp:  21 27 16   Height: 5\' 9"  (1.753 m)     Weight:  87 kg (191 lb 12.8 oz)    SpO2:  93% 93% 94%    Gen:  NAD, resting tremor bilateral hands Cardiovascular:  RRR, No M/R/G Respiratory: Lungs CTAB Gastrointestinal: Abdomen soft, NT/ND with normal active bowel sounds. Extremities: No C/E/C   The results of significant diagnostics from this hospitalization (including imaging, microbiology, ancillary and laboratory) are listed below for reference.     Procedures and Diagnostic Studies:    Dg Chest Port 1 View 03-21-14: Mild cardiac enlargement and mild pulmonary vascular congestion. No focal consolidation or edema.   Labs:   Basic Metabolic Panel:  Recent Labs Lab 21-Mar-2014 0212 02/28/14 0500  NA 139 140  K 4.0 4.0  CL 104 110  CO2 25 25  GLUCOSE 104* 87  BUN 28* 27*  CREATININE 1.08 0.84  CALCIUM 8.8 8.1*   GFR Estimated Creatinine Clearance: 60 mL/min (by C-G formula based on Cr of 0.84). Liver Function Tests:  Recent Labs Lab 2014-03-21 0212 02/28/14 0500  AST 28 24  ALT 21 19  ALKPHOS 78 71  BILITOT 0.6 0.9  PROT 6.9 5.7*  ALBUMIN 3.4* 2.8*   Coagulation profile  Recent Labs Lab 21-Mar-2014  1856  INR 1.02    CBC:  Recent Labs Lab 02/27/14 0212 02/27/14 0521 02/27/14 1300 02/28/14 0846 03/01/14 0414  WBC 17.5* 17.3* 14.5* 11.8* 10.8*  NEUTROABS 10.4*  --   --   --   --   HGB 13.6 12.4* 12.7* 12.2* 11.8*  HCT 41.9 38.1* 38.0* 36.8* 35.8*  MCV 95.2 95.3 93.8 93.2 93.7  PLT 483* 438* 378 384 348   CBG:  Recent Labs Lab 02/28/14 0837  GLUCAP 88   Thyroid function studies  Recent Labs  02/28/14 0500  TSH 5.094*   Microbiology Recent Results (from the past 240 hour(s))  MRSA PCR Screening     Status: None   Collection Time: 02/27/14  1:29 PM  Result Value Ref Range Status   MRSA  by PCR NEGATIVE NEGATIVE Final    Comment:        The GeneXpert MRSA Assay (FDA approved for NASAL specimens only), is one component of a comprehensive MRSA colonization surveillance program. It is not intended to diagnose MRSA infection nor to guide or monitor treatment for MRSA infections.      Discharge Instructions:   Discharge Instructions    Call MD for:  difficulty breathing, headache or visual disturbances    Complete by:  As directed      Call MD for:  extreme fatigue    Complete by:  As directed      Call MD for:    Complete by:  As directed   Recurrent blood in the stools.     Diet - low sodium heart healthy    Complete by:  As directed      Discharge instructions    Complete by:  As directed   You may resume taking 81 mg of aspirin daily in 1 week if there is no further rectal bleeding.  Your PCP may want to set you up to see a GI specialist for a colonoscopy if you have not had one in the last 5 years.  See your PCP in 1 week to re-check your blood counts to ensure they remain stable.     Face-to-face encounter (required for Medicare/Medicaid patients)    Complete by:  As directed   I RAMA,CHRISTINA certify that this patient is under my care and that I, or a nurse practitioner or physician's assistant working with me, had a face-to-face encounter that meets the physician face-to-face encounter requirements with this patient on 03/01/2014. The encounter with the patient was in whole, or in part for the following medical condition(s) which is the primary reason for home health care (List medical condition): GIB, anemia, weakness post hospitalization.  The encounter with the patient was in whole, or in part, for the following medical condition, which is the primary reason for home health care:  GIB, weakness  I certify that, based on my findings, the following services are medically necessary home health services:  Physical therapy  Reason for Medically Necessary Home  Health Services:   Therapy- Home Adaptation to Facilitate Safety Therapy- Therapeutic Exercises to Increase Strength and Endurance    My clinical findings support the need for the above services:  Unable to leave home safely without assistance and/or assistive device  Further, I certify that my clinical findings support that this patient is homebound due to:  Unable to leave home safely without assistance     Home Health    Complete by:  As directed   To provide the following care/treatments:  PT  Increase activity slowly    Complete by:  As directed             Medication List    STOP taking these medications        aspirin 81 MG tablet      TAKE these medications        finasteride 5 MG tablet  Commonly known as:  PROSCAR  Take 5 mg by mouth daily.     GARLIC PO  Take 1 tablet by mouth daily.     hydrochlorothiazide 25 MG tablet  Commonly known as:  HYDRODIURIL  Take 25 mg by mouth daily.           Follow-up Information    Follow up with Elsie Stain, MD. Schedule an appointment as soon as possible for a visit in 1 week.   Specialty:  Family Medicine   Why:  For a re-check of your blood counts, consideration for a GI referral.   Contact information:   [place] [address] [phone]        Time coordinating discharge: 35 minutes.  Signed:  RAMA,CHRISTINA  Pager [phone] [facility] 03/01/2014, 8:56 AM

## 2014-03-01 NOTE — Progress Notes (Addendum)
CARE MANAGEMENT NOTE 03/01/2014  Patient:  Seth Hunter, Seth Hunter   Account Number:  [id number]  Date Initiated:  02/27/2014  Documentation initiated by:  DAVIS,RHONDA  Subjective/Objective Assessment:   pt with massive rectal bleeding tachycardic,hypotensive,     Action/Plan:   home when stable   Anticipated DC Date:  03/02/2014   Anticipated DC Plan:  [place] referral  NA      DC Planning Services  CM consult      [facility] Choice  HOME HEALTH   Choice offered to / List presented to:  C-4 Adult Children      DME agency  NA     [place] arranged  HH-2 PT      [place]   Status of service:  Completed, signed off Medicare Important Message given?  NA - LOS <3 / Initial given by admissions (If response is "NO", the following Medicare IM given date fields will be blank) Date Medicare IM given:   Medicare IM given by:   Date Additional Medicare IM given:   Additional Medicare IM given by:    Discharge Disposition:  [place]  Per UR Regulation:  Reviewed for med. necessity/level of care/duration of stay  If discussed at [place] of Stay Meetings, dates discussed:    Comments:  03/01/2014 1000 NCM spoke to pt and gave permission to speak to niece, Baltazar Apo # [phone]. Offered choice to niece for [facility]. Contacted [facility] with new referral. Niece states pt has Rollator, RW, 3n1 and wheelchair. Jonnie Finner RN CCM Case Mgmt phone [phone]  Feb. 12 2016/Rhonda L. Rosana Hoes, RN, BSN, CCM/Case Management [place] [phone] No discharge needs present of time of review.

## 2014-03-01 NOTE — Discharge Instructions (Signed)
Bloody Stools Bloody stools often mean that there is a problem in the digestive tract. Your caregiver may use the term "melena" to describe black, tarry, and bad smelling stools or "hematochezia" to describe red or maroon-colored stools. Blood seen in the stool can be caused by bleeding anywhere along the intestinal tract.  A black stool usually means that blood is coming from the upper part of the gastrointestinal tract (esophagus, stomach, or small bowel). Passing maroon-colored stools or bright red blood usually means that blood is coming from lower down in the large bowel or the rectum. However, sometimes massive bleeding in the stomach or small intestine can cause bright red bloody stools.  Consuming black licorice, lead, iron pills, medicines containing bismuth subsalicylate, or blueberries can also cause black stools. Your caregiver can test black stools to see if blood is present. It is important that the cause of the bleeding be found. Treatment can then be started, and the problem can be corrected. Rectal bleeding may not be serious, but you should not assume everything is okay until you know the cause.It is very important to follow up with your caregiver or a specialist in gastrointestinal problems. CAUSES  Blood in the stools can come from various underlying causes.Often, the cause is not found during your first visit. Testing is often needed to discover the cause of bleeding in the gastrointestinal tract. Causes range from simple to serious or even life-threatening.Possible causes include:  Hemorrhoids.These are veins that are full of blood (engorged) in the rectum. They cause pain, inflammation, and may bleed.  Anal fissures.These are areas of painful tearing which may bleed. They are often caused by passing hard stool.  Diverticulosis.These are pouches that form on the colon over time, with age, and may bleed significantly.  Diverticulitis.This is inflammation in areas with  diverticulosis. It can cause pain, fever, and bloody stools, although bleeding is rare.  Proctitis and colitis. These are inflamed areas of the rectum or colon. They may cause pain, fever, and bloody stools.  Polyps and cancer. Colon cancer is a leading cause of preventable cancer death.It often starts out as precancerous polyps that can be removed during a colonoscopy, preventing progression into cancer. Sometimes, polyps and cancer may cause rectal bleeding.  Gastritis and ulcers.Bleeding from the upper gastrointestinal tract (near the stomach) may travel through the intestines and produce black, sometimes tarry, often bad smelling stools. In certain cases, if the bleeding is fast enough, the stools may not be black, but red and the condition may be life-threatening. SYMPTOMS  You may have stools that are bright red and bloody, that are normal color with blood on them, or that are dark black and tarry. In some cases, you may only have blood in the toilet bowl. Any of these cases need medical care. You may also have:  Pain at the anus or anywhere in the rectum.  Lightheadedness or feeling faint.  Extreme weakness.  Nausea or vomiting.  Fever. DIAGNOSIS Your caregiver may use the following methods to find the cause of your bleeding:  Taking a medical history. Age is important. Older people tend to develop polyps and cancer more often. If there is anal pain and a hard, large stool associated with bleeding, a tear of the anus may be the cause. If blood drips into the toilet after a bowel movement, bleeding hemorrhoids may be the problem. The color and frequency of the bleeding are additional considerations. In most cases, the medical history provides clues, but seldom the final  answer.  A visual and finger (digital) exam. Your caregiver will inspect the anal area, looking for tears and hemorrhoids. A finger exam can provide information when there is tenderness or a growth inside. In men, the  prostate is also examined.  Endoscopy. Several types of small, long scopes (endoscopes) are used to view the colon.  In the office, your caregiver may use a rigid, or more commonly, a flexible viewing sigmoidoscope. This exam is called flexible sigmoidoscopy. It is performed in 5 to 10 minutes.  A more thorough exam is accomplished with a colonoscope. It allows your caregiver to view the entire 5 to 6 foot long colon. Medicine to help you relax (sedative) is usually given for this exam. Frequently, a bleeding lesion may be present beyond the reach of the sigmoidoscope. So, a colonoscopy may be the best exam to start with. Both exams are usually done on an outpatient basis. This means the patient does not stay overnight in the hospital or surgery center.  An upper endoscopy may be needed to examine your stomach. Sedation is used and a flexible endoscope is put in your mouth, down to your stomach.  A barium enema X-ray. This is an X-ray exam. It uses liquid barium inserted by enema into the rectum. This test alone may not identify an actual bleeding point. X-rays highlight abnormal shadows, such as those made by lumps (tumors), diverticuli, or colitis. TREATMENT  Treatment depends on the cause of your bleeding.   For bleeding from the stomach or colon, the caregiver doing your endoscopy or colonoscopy may be able to stop the bleeding as part of the procedure.  Inflammation or infection of the colon can be treated with medicines.  Many rectal problems can be treated with creams, suppositories, or warm baths.  Surgery is sometimes needed.  Blood transfusions are sometimes needed if you have lost a lot of blood.  For any bleeding problem, let your caregiver know if you take aspirin or other blood thinners regularly. HOME CARE INSTRUCTIONS   Take any medicines exactly as prescribed.  Keep your stools soft by eating a diet high in fiber. Prunes (1 to 3 a day) work well for many people.  Drink  enough water and fluids to keep your urine clear or pale yellow.  Take sitz baths if advised. A sitz bath is when you sit in a bathtub with warm water for 10 to 15 minutes to soak, soothe, and cleanse the rectal area.  If enemas or suppositories are advised, be sure you know how to use them. Tell your caregiver if you have problems with this.  Monitor your bowel movements to look for signs of improvement or worsening. SEEK MEDICAL CARE IF:   You do not improve in the time expected.  Your condition worsens after initial improvement.  You develop any new symptoms. SEEK IMMEDIATE MEDICAL CARE IF:   You develop severe or prolonged rectal bleeding.  You vomit blood.  You feel weak or faint.  You have a fever. MAKE SURE YOU:  Understand these instructions.  Will watch your condition.  Will get help right away if you are not doing well or get worse. Document Released: 12/23/2001 Document Revised: 03/27/2011 Document Reviewed: 05/20/2010 [facility] Patient Information 2015 [place], [place]. This information is not intended to replace advice given to you by your health care provider. Make sure you discuss any questions you have with your health care provider.  Rectal Bleeding Rectal bleeding is when blood passes out of the anus.  It is usually a sign that something is wrong. It may not be serious, but it should always be evaluated. Rectal bleeding may present as bright red blood or extremely dark stools. The color may range from dark red or maroon to black (like tar). It is important that the cause of rectal bleeding be identified so treatment can be started and the problem corrected. CAUSES   Hemorrhoids. These are enlarged (dilated) blood vessels or veins in the anal or rectal area.  Fistulas. Theseare abnormal, burrowing channels that usually run from inside the rectum to the skin around the anus. They can bleed.  Anal fissures. This is a tear in the tissue of the anus. Bleeding  occurs with bowel movements.  Diverticulosis. This is a condition in which pockets or sacs project from the bowel wall. Occasionally, the sacs can bleed.  Diverticulitis. Thisis an infection involving diverticulosis of the colon.  Proctitis and colitis. These are conditions in which the rectum, colon, or both, can become inflamed and pitted (ulcerated).  Polyps and cancer. Polyps are non-cancerous (benign) growths in the colon that may bleed. Certain types of polyps turn into cancer.  Protrusion of the rectum. Part of the rectum can project from the anus and bleed.  Certain medicines.  Intestinal infections.  Blood vessel abnormalities. HOME CARE INSTRUCTIONS  Eat a high-fiber diet to keep your stool soft.  Limit activity.  Drink enough fluids to keep your urine clear or pale yellow.  Warm baths may be useful to soothe rectal pain.  Follow up with your caregiver as directed. SEEK IMMEDIATE MEDICAL CARE IF:  You develop increased bleeding.  You have black or dark red stools.  You vomit blood or material that looks like coffee grounds.  You have abdominal pain or tenderness.  You have a fever.  You feel weak, nauseous, or you faint.  You have severe rectal pain or you are unable to have a bowel movement. MAKE SURE YOU:  Understand these instructions.  Will watch your condition.  Will get help right away if you are not doing well or get worse. Document Released: 06/24/2001 Document Revised: 03/27/2011 Document Reviewed: 06/19/2010 [facility] Patient Information 2015 [place], [place]. This information is not intended to replace advice given to you by your health care provider. Make sure you discuss any questions you have with your health care provider.

## 2014-03-04 ENCOUNTER — Telehealth: Payer: Self-pay | Admitting: Family Medicine

## 2014-03-04 DIAGNOSIS — R269 Unspecified abnormalities of gait and mobility: Secondary | ICD-10-CM | POA: Diagnosis not present

## 2014-03-04 DIAGNOSIS — R251 Tremor, unspecified: Secondary | ICD-10-CM | POA: Diagnosis not present

## 2014-03-04 DIAGNOSIS — J449 Chronic obstructive pulmonary disease, unspecified: Secondary | ICD-10-CM | POA: Diagnosis not present

## 2014-03-04 DIAGNOSIS — I10 Essential (primary) hypertension: Secondary | ICD-10-CM | POA: Diagnosis not present

## 2014-03-04 DIAGNOSIS — Z9181 History of falling: Secondary | ICD-10-CM | POA: Diagnosis not present

## 2014-03-04 DIAGNOSIS — Z7982 Long term (current) use of aspirin: Secondary | ICD-10-CM | POA: Diagnosis not present

## 2014-03-04 NOTE — Telephone Encounter (Signed)
Erin @ [facility] called She needs verbal order for home PT for twice a week for 2 weeks. Fall prevention and strengthen after hospital

## 2014-03-04 NOTE — Telephone Encounter (Signed)
Order given by telephone to [facility] as instructed.

## 2014-03-04 NOTE — Telephone Encounter (Signed)
Left message on voice mail  to call back

## 2014-03-04 NOTE — Telephone Encounter (Signed)
Please give the order.  Thanks.   

## 2014-03-06 DIAGNOSIS — I10 Essential (primary) hypertension: Secondary | ICD-10-CM | POA: Diagnosis not present

## 2014-03-06 DIAGNOSIS — Z9181 History of falling: Secondary | ICD-10-CM | POA: Diagnosis not present

## 2014-03-06 DIAGNOSIS — J449 Chronic obstructive pulmonary disease, unspecified: Secondary | ICD-10-CM | POA: Diagnosis not present

## 2014-03-06 DIAGNOSIS — R269 Unspecified abnormalities of gait and mobility: Secondary | ICD-10-CM | POA: Diagnosis not present

## 2014-03-06 DIAGNOSIS — R251 Tremor, unspecified: Secondary | ICD-10-CM | POA: Diagnosis not present

## 2014-03-06 DIAGNOSIS — Z7982 Long term (current) use of aspirin: Secondary | ICD-10-CM | POA: Diagnosis not present

## 2014-03-09 DIAGNOSIS — Z9181 History of falling: Secondary | ICD-10-CM | POA: Diagnosis not present

## 2014-03-09 DIAGNOSIS — I10 Essential (primary) hypertension: Secondary | ICD-10-CM | POA: Diagnosis not present

## 2014-03-09 DIAGNOSIS — Z7982 Long term (current) use of aspirin: Secondary | ICD-10-CM | POA: Diagnosis not present

## 2014-03-09 DIAGNOSIS — R269 Unspecified abnormalities of gait and mobility: Secondary | ICD-10-CM | POA: Diagnosis not present

## 2014-03-09 DIAGNOSIS — R251 Tremor, unspecified: Secondary | ICD-10-CM | POA: Diagnosis not present

## 2014-03-09 DIAGNOSIS — J449 Chronic obstructive pulmonary disease, unspecified: Secondary | ICD-10-CM | POA: Diagnosis not present

## 2014-03-11 DIAGNOSIS — R269 Unspecified abnormalities of gait and mobility: Secondary | ICD-10-CM | POA: Diagnosis not present

## 2014-03-11 DIAGNOSIS — J449 Chronic obstructive pulmonary disease, unspecified: Secondary | ICD-10-CM | POA: Diagnosis not present

## 2014-03-11 DIAGNOSIS — R251 Tremor, unspecified: Secondary | ICD-10-CM | POA: Diagnosis not present

## 2014-03-11 DIAGNOSIS — Z9181 History of falling: Secondary | ICD-10-CM | POA: Diagnosis not present

## 2014-03-11 DIAGNOSIS — Z7982 Long term (current) use of aspirin: Secondary | ICD-10-CM | POA: Diagnosis not present

## 2014-03-11 DIAGNOSIS — I10 Essential (primary) hypertension: Secondary | ICD-10-CM | POA: Diagnosis not present

## 2014-03-12 ENCOUNTER — Encounter: Payer: Self-pay | Admitting: *Deleted

## 2014-03-13 ENCOUNTER — Encounter: Payer: Self-pay | Admitting: Family Medicine

## 2014-03-16 ENCOUNTER — Encounter: Payer: Self-pay | Admitting: Family Medicine

## 2014-03-16 ENCOUNTER — Ambulatory Visit (INDEPENDENT_AMBULATORY_CARE_PROVIDER_SITE_OTHER): Payer: Medicare Other | Admitting: Family Medicine

## 2014-03-16 VITALS — BP 110/56 | HR 84 | Temp 98.8°F | Wt 228.2 lb

## 2014-03-16 DIAGNOSIS — K5791 Diverticulosis of intestine, part unspecified, without perforation or abscess with bleeding: Secondary | ICD-10-CM

## 2014-03-16 DIAGNOSIS — K922 Gastrointestinal hemorrhage, unspecified: Secondary | ICD-10-CM | POA: Diagnosis not present

## 2014-03-16 DIAGNOSIS — H6123 Impacted cerumen, bilateral: Secondary | ICD-10-CM

## 2014-03-16 DIAGNOSIS — R251 Tremor, unspecified: Secondary | ICD-10-CM | POA: Diagnosis not present

## 2014-03-16 DIAGNOSIS — Z7982 Long term (current) use of aspirin: Secondary | ICD-10-CM | POA: Diagnosis not present

## 2014-03-16 DIAGNOSIS — R7989 Other specified abnormal findings of blood chemistry: Secondary | ICD-10-CM | POA: Diagnosis not present

## 2014-03-16 DIAGNOSIS — I10 Essential (primary) hypertension: Secondary | ICD-10-CM | POA: Diagnosis not present

## 2014-03-16 DIAGNOSIS — R269 Unspecified abnormalities of gait and mobility: Secondary | ICD-10-CM | POA: Diagnosis not present

## 2014-03-16 DIAGNOSIS — J449 Chronic obstructive pulmonary disease, unspecified: Secondary | ICD-10-CM | POA: Diagnosis not present

## 2014-03-16 DIAGNOSIS — Z9181 History of falling: Secondary | ICD-10-CM | POA: Diagnosis not present

## 2014-03-16 NOTE — Assessment & Plan Note (Signed)
No goiter on exam, consider recheck episodically. >25 minutes spent in face to face time with patient, >50% spent in counselling or coordination of care.

## 2014-03-16 NOTE — Assessment & Plan Note (Signed)
Resolved, R canal irritation was from wax adherence, not just from removal per se.   Use a cotton ball if needed, recheck mult times w/o no bleed at end of OV. TM intact B.  Hearing improved.

## 2014-03-16 NOTE — Progress Notes (Signed)
Pre visit review using our clinic review tool, if applicable. No additional management support is needed unless otherwise documented below in the visit note.  Hospitalized, transfused, no more bleeding in meantime.  Off ASA for now.  No abd pain.  Eating well.  No fevers.  Here for f/u, due for f/u CBC.  D/w pt about possible interventions at this point .  Mildly elevated TSH prev noted as inpatient, d/w pt, see plan.   H/o HTN.  Off HCTZ recently.  Not SOB.    Hard of hearing, worse recently.  B cerumen impaction noted.   PMH and SH reviewed  ROS: See HPI, otherwise noncontributory.  Meds, vitals, and allergies reviewed.   nad Hard of hearing, B cerumen impaction noted.  Both removed with curette.  B ears with improved hearing afterward.   In removing the R ear wax, the hard pug was adherent to the canal wall.  In removing the plug (w/o the curette at the canal skin- ie in the middle of the plug) some irritated skin oozed a very small amount blood.  No pain per patient.  Not actively bleeding.  B TMs intact.  OP wnl, MMM Neck supple, no LA rrr ctab abd soft, not ttp Ext w/ 1+ BLE edema.

## 2014-03-16 NOTE — Assessment & Plan Note (Signed)
Off HCTZ for now, continue off med unless sig inc in edema or inc in BP.  Patient and niece agree.

## 2014-03-16 NOTE — Patient Instructions (Signed)
Stay off the aspirin for now.  Go to the lab on the way out.  We'll contact you with your lab report. Stay off the HCTZ for now. If you BP is elevated or the swelling gets a lot worse, then let me know.  If needed, the put a cotton ball in the right ear canal (with some neosporin on it).  That should help.  Take care.  Glad to see you.

## 2014-03-16 NOTE — Assessment & Plan Note (Signed)
Recheck CBC today.  Hold ASA for now.  Depending on CBC, we may be able to restart QOD dosing, d/w pt and niece.  Risk benefit d/w pt.  I will defer final decision to them.  I see the point in stopping totally vs QOD dosing.  If more bleeding, would likely cease totally.  At this point, given his conditions, would avoid other interventions, ie colonoscopy.  Patient and niece agree.

## 2014-03-17 ENCOUNTER — Telehealth: Payer: Self-pay | Admitting: Family Medicine

## 2014-03-17 LAB — CBC WITH DIFFERENTIAL/PLATELET
BASOS PCT: 0.3 % (ref 0.0–3.0)
Basophils Absolute: 0 10*3/uL (ref 0.0–0.1)
EOS PCT: 2.6 % (ref 0.0–5.0)
Eosinophils Absolute: 0.3 10*3/uL (ref 0.0–0.7)
HEMATOCRIT: 40.8 % (ref 39.0–52.0)
HEMOGLOBIN: 13.7 g/dL (ref 13.0–17.0)
LYMPHS ABS: 3.8 10*3/uL (ref 0.7–4.0)
LYMPHS PCT: 35.6 % (ref 12.0–46.0)
MCHC: 33.6 g/dL (ref 30.0–36.0)
MCV: 93.4 fl (ref 78.0–100.0)
MONOS PCT: 8.7 % (ref 3.0–12.0)
Monocytes Absolute: 0.9 10*3/uL (ref 0.1–1.0)
NEUTROS ABS: 5.6 10*3/uL (ref 1.4–7.7)
Neutrophils Relative %: 52.8 % (ref 43.0–77.0)
Platelets: 383 10*3/uL (ref 150.0–400.0)
RBC: 4.37 Mil/uL (ref 4.22–5.81)
RDW: 15.4 % (ref 11.5–15.5)
WBC: 10.7 10*3/uL — ABNORMAL HIGH (ref 4.0–10.5)

## 2014-03-17 NOTE — Telephone Encounter (Signed)
emmi emailed °

## 2014-03-19 DIAGNOSIS — R269 Unspecified abnormalities of gait and mobility: Secondary | ICD-10-CM | POA: Diagnosis not present

## 2014-03-19 DIAGNOSIS — Z7982 Long term (current) use of aspirin: Secondary | ICD-10-CM | POA: Diagnosis not present

## 2014-03-19 DIAGNOSIS — Z9181 History of falling: Secondary | ICD-10-CM | POA: Diagnosis not present

## 2014-03-19 DIAGNOSIS — J449 Chronic obstructive pulmonary disease, unspecified: Secondary | ICD-10-CM | POA: Diagnosis not present

## 2014-03-19 DIAGNOSIS — I10 Essential (primary) hypertension: Secondary | ICD-10-CM | POA: Diagnosis not present

## 2014-03-19 DIAGNOSIS — R251 Tremor, unspecified: Secondary | ICD-10-CM | POA: Diagnosis not present

## 2014-03-23 DIAGNOSIS — J449 Chronic obstructive pulmonary disease, unspecified: Secondary | ICD-10-CM | POA: Diagnosis not present

## 2014-03-23 DIAGNOSIS — R251 Tremor, unspecified: Secondary | ICD-10-CM | POA: Diagnosis not present

## 2014-03-23 DIAGNOSIS — Z7982 Long term (current) use of aspirin: Secondary | ICD-10-CM

## 2014-03-23 DIAGNOSIS — I10 Essential (primary) hypertension: Secondary | ICD-10-CM | POA: Diagnosis not present

## 2014-03-23 DIAGNOSIS — Z9181 History of falling: Secondary | ICD-10-CM

## 2014-03-23 DIAGNOSIS — R269 Unspecified abnormalities of gait and mobility: Secondary | ICD-10-CM | POA: Diagnosis not present

## 2014-03-25 DIAGNOSIS — R251 Tremor, unspecified: Secondary | ICD-10-CM | POA: Diagnosis not present

## 2014-03-25 DIAGNOSIS — I10 Essential (primary) hypertension: Secondary | ICD-10-CM | POA: Diagnosis not present

## 2014-03-25 DIAGNOSIS — R269 Unspecified abnormalities of gait and mobility: Secondary | ICD-10-CM | POA: Diagnosis not present

## 2014-03-25 DIAGNOSIS — Z9181 History of falling: Secondary | ICD-10-CM | POA: Diagnosis not present

## 2014-03-25 DIAGNOSIS — J449 Chronic obstructive pulmonary disease, unspecified: Secondary | ICD-10-CM | POA: Diagnosis not present

## 2014-03-25 DIAGNOSIS — Z7982 Long term (current) use of aspirin: Secondary | ICD-10-CM | POA: Diagnosis not present

## 2014-04-06 ENCOUNTER — Telehealth: Payer: Self-pay

## 2014-04-06 NOTE — Telephone Encounter (Signed)
Left message for pt to call back if he still wants flu vaccine

## 2014-04-16 ENCOUNTER — Encounter: Payer: Self-pay | Admitting: *Deleted

## 2014-05-13 ENCOUNTER — Encounter: Payer: Self-pay | Admitting: *Deleted

## 2014-06-19 ENCOUNTER — Encounter: Payer: Self-pay | Admitting: *Deleted

## 2014-07-14 ENCOUNTER — Encounter: Payer: Self-pay | Admitting: *Deleted

## 2014-08-11 ENCOUNTER — Encounter: Payer: Self-pay | Admitting: *Deleted

## 2014-08-17 ENCOUNTER — Emergency Department (HOSPITAL_COMMUNITY): Payer: Medicare Other

## 2014-08-17 ENCOUNTER — Emergency Department (HOSPITAL_BASED_OUTPATIENT_CLINIC_OR_DEPARTMENT_OTHER)
Admit: 2014-08-17 | Discharge: 2014-08-17 | Disposition: A | Discharge status: Home / Self Care | Payer: Medicare Other | Attending: Emergency Medicine | Admitting: Emergency Medicine

## 2014-08-17 ENCOUNTER — Telehealth: Payer: Self-pay | Admitting: Family Medicine

## 2014-08-17 ENCOUNTER — Emergency Department (HOSPITAL_COMMUNITY)
Admission: EM | Admit: 2014-08-17 | Discharge: 2014-08-17 | Disposition: A | Discharge status: Home / Self Care | Payer: Medicare Other | Attending: Emergency Medicine | Admitting: Emergency Medicine

## 2014-08-17 ENCOUNTER — Encounter (HOSPITAL_COMMUNITY): Payer: Self-pay | Admitting: Emergency Medicine

## 2014-08-17 DIAGNOSIS — I10 Essential (primary) hypertension: Secondary | ICD-10-CM | POA: Insufficient documentation

## 2014-08-17 DIAGNOSIS — R918 Other nonspecific abnormal finding of lung field: Secondary | ICD-10-CM | POA: Diagnosis not present

## 2014-08-17 DIAGNOSIS — N400 Enlarged prostate without lower urinary tract symptoms: Secondary | ICD-10-CM | POA: Diagnosis not present

## 2014-08-17 DIAGNOSIS — Z79899 Other long term (current) drug therapy: Secondary | ICD-10-CM | POA: Insufficient documentation

## 2014-08-17 DIAGNOSIS — Z950 Presence of cardiac pacemaker: Secondary | ICD-10-CM | POA: Insufficient documentation

## 2014-08-17 DIAGNOSIS — C771 Secondary and unspecified malignant neoplasm of intrathoracic lymph nodes: Secondary | ICD-10-CM | POA: Diagnosis not present

## 2014-08-17 DIAGNOSIS — Z8719 Personal history of other diseases of the digestive system: Secondary | ICD-10-CM | POA: Diagnosis not present

## 2014-08-17 DIAGNOSIS — Z8781 Personal history of (healed) traumatic fracture: Secondary | ICD-10-CM | POA: Diagnosis not present

## 2014-08-17 DIAGNOSIS — R2241 Localized swelling, mass and lump, right lower limb: Secondary | ICD-10-CM | POA: Diagnosis present

## 2014-08-17 DIAGNOSIS — J449 Chronic obstructive pulmonary disease, unspecified: Secondary | ICD-10-CM | POA: Diagnosis not present

## 2014-08-17 DIAGNOSIS — Z8739 Personal history of other diseases of the musculoskeletal system and connective tissue: Secondary | ICD-10-CM | POA: Diagnosis not present

## 2014-08-17 DIAGNOSIS — M7989 Other specified soft tissue disorders: Secondary | ICD-10-CM | POA: Diagnosis not present

## 2014-08-17 DIAGNOSIS — C801 Malignant (primary) neoplasm, unspecified: Secondary | ICD-10-CM | POA: Diagnosis not present

## 2014-08-17 DIAGNOSIS — Z862 Personal history of diseases of the blood and blood-forming organs and certain disorders involving the immune mechanism: Secondary | ICD-10-CM | POA: Diagnosis not present

## 2014-08-17 DIAGNOSIS — I82401 Acute embolism and thrombosis of unspecified deep veins of right lower extremity: Secondary | ICD-10-CM | POA: Diagnosis not present

## 2014-08-17 DIAGNOSIS — F0390 Unspecified dementia without behavioral disturbance: Secondary | ICD-10-CM | POA: Diagnosis not present

## 2014-08-17 DIAGNOSIS — Z8701 Personal history of pneumonia (recurrent): Secondary | ICD-10-CM | POA: Diagnosis not present

## 2014-08-17 DIAGNOSIS — Z87440 Personal history of urinary (tract) infections: Secondary | ICD-10-CM | POA: Insufficient documentation

## 2014-08-17 LAB — CBC WITH DIFFERENTIAL/PLATELET
Basophils Absolute: 0 10*3/uL (ref 0.0–0.1)
Basophils Relative: 0 % (ref 0–1)
EOS ABS: 0.6 10*3/uL (ref 0.0–0.7)
Eosinophils Relative: 5 % (ref 0–5)
HCT: 42.4 % (ref 39.0–52.0)
Hemoglobin: 13.8 g/dL (ref 13.0–17.0)
Lymphocytes Relative: 38 % (ref 12–46)
Lymphs Abs: 4.6 10*3/uL — ABNORMAL HIGH (ref 0.7–4.0)
MCH: 31 pg (ref 26.0–34.0)
MCHC: 32.5 g/dL (ref 30.0–36.0)
MCV: 95.3 fL (ref 78.0–100.0)
MONO ABS: 1 10*3/uL (ref 0.1–1.0)
Monocytes Relative: 8 % (ref 3–12)
Neutro Abs: 5.9 10*3/uL (ref 1.7–7.7)
Neutrophils Relative %: 49 % (ref 43–77)
PLATELETS: 377 10*3/uL (ref 150–400)
RBC: 4.45 MIL/uL (ref 4.22–5.81)
RDW: 15 % (ref 11.5–15.5)
WBC: 12 10*3/uL — ABNORMAL HIGH (ref 4.0–10.5)

## 2014-08-17 LAB — BASIC METABOLIC PANEL
ANION GAP: 5 (ref 5–15)
BUN: 16 mg/dL (ref 6–20)
CHLORIDE: 105 mmol/L (ref 101–111)
CO2: 27 mmol/L (ref 22–32)
Calcium: 8.7 mg/dL — ABNORMAL LOW (ref 8.9–10.3)
Creatinine, Ser: 0.95 mg/dL (ref 0.61–1.24)
GFR calc Af Amer: >60 mL/min (ref >60)
GFR calc non Af Amer: >60 mL/min (ref >60)
Glucose, Bld: 96 mg/dL (ref 65–99)
Potassium: 4.3 mmol/L (ref 3.5–5.1)
SODIUM: 137 mmol/L (ref 135–145)

## 2014-08-17 MED ORDER — XARELTO VTE STARTER PACK 15 & 20 MG PO TBPK: 15.0000 mg | ORAL_TABLET | ORAL | Status: DC

## 2014-08-17 MED ORDER — IOHEXOL 350 MG/ML SOLN
100.0000 mL | Freq: Once | INTRAVENOUS | Status: AC | PRN
  Administered 2014-08-17: 100 mL via INTRAVENOUS

## 2014-08-17 NOTE — Progress Notes (Signed)
VASCULAR LAB PRELIMINARY  PRELIMINARY  PRELIMINARY  PRELIMINARY  Bilateral lower extremity venous duplex  completed.    Preliminary report:  Right:  DVT noted in the CFV, FV, popliteal v .  No evidence of superficial thrombosis.  No Baker's cyst.  Left:  No evidence of DVT, superficial thrombosis, or Baker's cyst.  Seth Hunter, RVT 08/17/2014, 4:07 PM

## 2014-08-17 NOTE — ED Provider Notes (Signed)
CSN: [id number]     Arrival date & time 08/17/14  1232 History   First MD Initiated Contact with Patient 08/17/14 1306     Chief Complaint  Patient presents with  . Leg Swelling  . Rash     (Consider location/radiation/quality/duration/timing/severity/associated sxs/prior Treatment) HPI  81 year old male with a history of dementia presents with acute right leg swelling. Niece at the bedside does the history. She states that yesterday family notices leg was swelling. He complains of pain only when it is palpated but has not otherwise not brought it up. There is no shortness of breath or chest pain. No obvious trauma. Patient is also developed a rash to the anterior aspect of his leg. No further history available due to patient's dementia.  Past Medical History  Diagnosis Date  . Syncope     Syncope in the setting of complete heart block status post permanent pacemaker placement on September 27, 2007  . Hypertension   . Dementia     thought this appears to be mild based on reports of family as of 8/11  . Elevated TSH     Mildly elevated TSH  . Atrioventricular block, complete   . Pacemaker -MDT   . COPD (chronic obstructive pulmonary disease) 04/1995    X-ray  . BPH (benign prostatic hyperplasia)   . Tremor   . Pneumonia 04/1995    Bilateral upper lobes  . Anemia 04/1995    Post op, 2nd to hemorrhage, left leg  . UTI (urinary tract infection) 05/1995  . DJD (degenerative joint disease), lumbar 04/1995    Spine, severe.   X-ray  . DJD (degenerative joint disease) 04/1995    Both shoulders, severe  . HOH (hard of hearing)   . Inguinal hernia   . Hip fracture   . UTI (urinary tract infection)   . COPD (chronic obstructive pulmonary disease)   . GIB (gastrointestinal bleeding)     presumed diverticular bleed, 02/2014 admission   Past Surgical History  Procedure Laterality Date  . Pacemaker insertion    . Cholecystectomy  1988  . Hernia repair  Years ago    Left inguinal  .  Ventral hernia repair  04/1995    with intra abdominal adhesions, lysis incidental appendectomy  . Pulmonary embolism surgery  04/1995    Post-op  . 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 Hematoma, left leg  05/1995    Probably 2nd to anticoag  . 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 Fractured left wrist  1990    with old right posterior rib fracture  . Syncope comp heart block  9/10- 09/30/2007    HOSP Perm Pacer placed, HTN, dementia, mildly elevated TSH  . Pacer placement (dr. Lovena Le)  09/27/2007  . Ct angio chest  09/27/07    No PE, No acute prob  . Femur im nail Left 10/21/2012    Procedure: INTRAMEDULLARY (IM) NAIL FEMORAL;  Surgeon: Meredith Pel, MD;  Location: [facility];  Service: Orthopedics;  Laterality: Left;  . Cholecystectomy    . Fracture surgery  2014    L femur   Family History  Problem Relation Age of Onset  . Heart disease Father     Hardening of the arteries, heart trouble   History  Substance Use Topics  . Smoking status: Never Smoker   . Smokeless tobacco: Not on file  . Alcohol Use: No    Review  of Systems  Unable to perform ROS     Allergies  Penicillins  Home Medications   Prior to Admission medications   Medication Sig Start Date End Date Taking? Authorizing Provider  docusate sodium (COLACE) 100 MG capsule Take 100 mg by mouth daily.   Yes Historical Provider, MD  finasteride (PROSCAR) 5 MG tablet Take 5 mg by mouth daily.   Yes Historical Provider, MD  Garlic 10 MG CAPS Take 1 capsule by mouth daily.   Yes Historical Provider, MD  hydrochlorothiazide (MICROZIDE) 12.5 MG capsule Take 12.5 mg by mouth daily.   Yes Historical Provider, MD  Menthol, Topical Analgesic, (BIOFREEZE) 4 % GEL Apply 1 application topically at bedtime. Applied to knees for arthritis   Yes Historical Provider, MD   BP 140/75 mmHg  Pulse 77  Temp(Src) 98.5 F (36.9 C) (Oral)  Resp 16  Ht '6\' 1"'$  (1.854 m)  Wt  185 lb (83.915 kg)  BMI 24.41 kg/m2  SpO2 96% Physical Exam  Constitutional: He is oriented to person, place, and time. He appears well-developed and well-nourished.  HENT:  Head: Normocephalic and atraumatic.  Right Ear: External ear normal.  Left Ear: External ear normal.  Nose: Nose normal.  Eyes: Right eye exhibits no discharge. Left eye exhibits no discharge.  Neck: Neck supple.  Cardiovascular: Normal rate, regular rhythm, normal heart sounds and intact distal pulses.   Pulses:      Dorsalis pedis pulses are 2+ on the right side, and 2+ on the left side.  Pulmonary/Chest: Effort normal.  Abdominal: Soft. There is no tenderness.  Musculoskeletal: He exhibits edema (RLE from knee to foot symmetrically swollen. Mild warmth. Mild tenderness. Pitting edema. LLE with mild pitting edema).  Neurological: He is alert and oriented to person, place, and time. He displays tremor.  Skin: Skin is warm and dry. Rash (nonspecific rash over right anterior tibia. Some crusting but no obvious vesicles) noted.  Nursing note and vitals reviewed.   ED Course  Procedures (including critical care time) Labs Review Labs Reviewed  BASIC METABOLIC PANEL - Abnormal; Notable for the following:    Calcium 8.7 (*)    All other components within normal limits  CBC WITH DIFFERENTIAL/PLATELET - Abnormal; Notable for the following:    WBC 12.0 (*)    Lymphs Abs 4.6 (*)    All other components within normal limits    Imaging Review No results found.   EKG Interpretation None      MDM   Final diagnoses:  Right leg DVT    Patient has an acute right lower extremity DVT. The vascular  Ultrasound tech reported this to me verbally but no note has been placed as of this charting. When discussing this with family the niece notes that patient has had increased work of breathing while talking since yesterday. Given the acute DVT will get a CT scan to rule out pulmonary embolism. Patient does have a  nonspecific rash over his right anterior leg that could be consistent with zoster/varicella to find this unlikely to be occurring concomitantly with a DVT. Patient was admitted a few months ago for lower GI bleed attributed to diverticulosis. He has had no bleeding since and no bleeding before.  I discussed risks and benefits of anticoagulation for the acute clot and at this point family wants to proceed with anticoagulation and understands risk of possible bleeding. If the patient does not have a pulmonary moles and I feel the patient is stable for discharge home  with oral anticoagulation. If there is a pulmonary embolism he will need admission. Care transferred to Dr. Tyrone Nine with CT pending.    Sherwood Gambler, MD 08/17/14 [phone]

## 2014-08-17 NOTE — Telephone Encounter (Signed)
Will await ER notes  

## 2014-08-17 NOTE — Telephone Encounter (Signed)
SE NOTE: All timestamps contained within this report are represented as [place] Standard Time. CONFIDENTIALTY NOTICE: This fax transmission is intended only for the addressee. It contains information that is legally privileged, confidential or otherwise protected from use or disclosure. If you are not the intended recipient, you are strictly prohibited from reviewing, disclosing, copying using or disseminating any of this information or taking any action in reliance on or regarding this information. If you have received this fax in error, please notify us immediately by telephone so that we can arrange for its return to [place]. Phone: [phone], Toll-Free: [phone], Fax: [phone] Page: 1 of 1 Call Id: [id number] [place] Patient Name: Seth Hunter DOB: September 13, 1918 Initial Comment Caller states that over the weekend, her uncle started having leg swelling and rash, caller is also POA Nurse Assessment Nurse: Mechele Dawley, RN, Amy Date/Time (Eastern Time): 08/17/2014 11:18:34 AM Confirm and document reason for call. If symptomatic, describe symptoms. ---SWELLING IN RIGHT LEG FROM ABOVE KNEE DOWN TO THE ANKLE. HE HAS A RASH ON THE SHIN AREA. IT HAS BEEN A YEAR SINCE HE FELL AND THAT WAS ALL HEALED. SHE HAS BEEN PUTTING SOME BIOFREEZE ON HIS KNEES. ALL OF THE SUDDEN THIS STARTED. HE WAS OFF OF THE HCTZ AND SHE PUT HIM BACK ON IT. DR. Damita Dunnings HAD HIM COME OFF OF THE HCTZ AND ASA DUE TO THE DIVERTICULOSIS. SHE STATES THAT THE SWELLING AND THE RASH IS ONLY ON THE ONE LEG. SHE IS USING THE BIOFREEZE ON BOTH LEGS AND THE RASH AND SWELLING IS ONLY ON ONE LEG. HURTS TO TOUCH THE LEG, HE CAN WALK ON IT. Has the patient traveled out of the country within the last 30 days? ---Not Applicable Does the patient require triage? ---Yes Related visit to physician within the last 2 weeks? ---No Does the PT have any chronic  conditions? (i.e. diabetes, asthma, etc.) ---Yes List chronic conditions. ---DIVERTICULOSIS, ARTHRITIS, Guidelines Guideline Title Affirmed Question Affirmed Notes Knee Swelling [1] Thigh or calf pain AND [2] only 1 side AND [3] present > 1 hour Final Disposition User See Physician within 4 Hours (or PCP triage) [place], RN, Amy Comments SENDING HIM IN TO ED FOR POSSIBILITY OF BLOOD CLOT. SWELLING AND RASH ONLY ON ONE LEG. Referrals [facility] - ED [facility] - [facility] Disagree/Comply: Comply

## 2014-08-17 NOTE — Discharge Instructions (Signed)

## 2014-08-17 NOTE — ED Notes (Addendum)
Pt complaint of redness/swelling to right leg onset yesterday; referred here to rule out DVT. Upon assessment blistering rash noted to right leg with redness surrounding area.

## 2014-08-17 NOTE — ED Provider Notes (Addendum)
Received care from Dr. Regenia Skeeter, please see his note for further details. In short patient had a chief complaint of shortness of breath while talking. Found to have a large DVT. Initial blood work unremarkable awaiting PE scan.  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 Discussed results with family contacted oncology will follow closely. Will start on xarelto   I have discussed the diagnosis/risks/treatment options with the patient and believe the pt to be eligible for discharge home to follow-up with PCP. We also discussed returning to the ED immediately if new or worsening sx occur. We discussed the sx which are most concerning (e.g., worsening sob, chest pain) that necessitate immediate return. Medications administered to the patient during their visit and any new prescriptions provided to the patient are listed below.  Medications given during this visit Medications  iohexol (OMNIPAQUE) 350 MG/ML injection 100 mL (100 mLs Intravenous Contrast Given 08/17/14 1706)    Discharge Medication List as of 08/17/2014  6:50 PM    START taking these medications   Details  XARELTO STARTER PACK 15 & 20 MG TBPK Take 15-20 mg by mouth as directed. Take as directed on package: Start with one '15mg'$  tablet by mouth twice a day with food. On Day 22, switch to one '20mg'$  tablet once a day with food., Starting 08/17/2014, Until Discontinued, Print         The patient appears reasonably screen and/or stabilized for discharge and I doubt any other medical condition or other [facility] requiring further screening, evaluation, or treatment in the ED at this time prior to discharge.    Deno Etienne, DO 08/17/14 [place], DO 08/17/14 [phone]

## 2014-08-20 ENCOUNTER — Other Ambulatory Visit: Payer: Self-pay | Admitting: *Deleted

## 2014-08-20 ENCOUNTER — Telehealth: Payer: Self-pay | Admitting: Family Medicine

## 2014-08-20 DIAGNOSIS — R918 Other nonspecific abnormal finding of lung field: Secondary | ICD-10-CM | POA: Insufficient documentation

## 2014-08-20 NOTE — Telephone Encounter (Signed)
Called his niece about the lung mass and recent events.  Leg swelling is better on xarelto.  They were having trouble getting f/u with Dr. Julien Nordmann.  I'll route this to [facility] and Dr. Julien Nordmann.  Please let me know if a referral is needed.  App help of all involved.  Thanks.

## 2014-08-20 NOTE — Telephone Encounter (Signed)
I sent an in basket message  to Rhys Martini to ask her if we need to put in an Oncology referral to expedite the patient being seen. [facility] ED was supposed to start this process.

## 2014-08-20 NOTE — Telephone Encounter (Signed)
Thanks.  Please notify his family.

## 2014-08-20 NOTE — Telephone Encounter (Signed)
Called niece, left detailed message on machine that the [facility] is working on Mr [facility] consult appointment and that they would call the family directly with appointment info. Left my name and phone number for her to call back if she needs to.

## 2014-08-24 ENCOUNTER — Telehealth: Payer: Self-pay | Admitting: *Deleted

## 2014-08-24 NOTE — Telephone Encounter (Signed)
Oncology Nurse Navigator Documentation  Oncology Nurse Navigator Flowsheets 08/24/2014  Navigator Encounter Type Introductory phone call/I called patient to set up to see Dr. Julien Nordmann.  I spoke with niece and she is aware of appt time and place.  08/27/14 arrive at 1:30  Treatment Phase Abnormal Scans  Interventions Coordination of Care  Time Spent with Patient 15

## 2014-08-26 ENCOUNTER — Telehealth: Payer: Self-pay | Admitting: *Deleted

## 2014-08-26 NOTE — Telephone Encounter (Signed)
Called pt and spoke w/ Sandi and confirmed clinic appt for 08/27/14.

## 2014-08-27 ENCOUNTER — Ambulatory Visit: Payer: Medicare Other | Attending: Internal Medicine | Admitting: Physical Therapy

## 2014-08-27 ENCOUNTER — Telehealth: Payer: Self-pay | Admitting: Internal Medicine

## 2014-08-27 ENCOUNTER — Other Ambulatory Visit (HOSPITAL_BASED_OUTPATIENT_CLINIC_OR_DEPARTMENT_OTHER): Payer: Medicare Other

## 2014-08-27 ENCOUNTER — Ambulatory Visit (HOSPITAL_BASED_OUTPATIENT_CLINIC_OR_DEPARTMENT_OTHER): Payer: Medicare Other | Admitting: Internal Medicine

## 2014-08-27 ENCOUNTER — Encounter: Payer: Self-pay | Admitting: *Deleted

## 2014-08-27 ENCOUNTER — Encounter: Payer: Self-pay | Admitting: Internal Medicine

## 2014-08-27 VITALS — BP 211/176 | HR 88 | Temp 98.5°F | Resp 16 | Ht 73.0 in | Wt 193.7 lb

## 2014-08-27 DIAGNOSIS — R29818 Other symptoms and signs involving the nervous system: Secondary | ICD-10-CM | POA: Diagnosis not present

## 2014-08-27 DIAGNOSIS — R2689 Other abnormalities of gait and mobility: Secondary | ICD-10-CM

## 2014-08-27 DIAGNOSIS — Z7409 Other reduced mobility: Secondary | ICD-10-CM | POA: Diagnosis not present

## 2014-08-27 DIAGNOSIS — R918 Other nonspecific abnormal finding of lung field: Secondary | ICD-10-CM

## 2014-08-27 DIAGNOSIS — M2560 Stiffness of unspecified joint, not elsewhere classified: Secondary | ICD-10-CM | POA: Insufficient documentation

## 2014-08-27 LAB — CBC WITH DIFFERENTIAL/PLATELET
BASO%: 0.8 % (ref 0.0–2.0)
BASOS ABS: 0.1 10*3/uL (ref 0.0–0.1)
EOS%: 3.6 % (ref 0.0–7.0)
Eosinophils Absolute: 0.5 10*3/uL (ref 0.0–0.5)
HCT: 44.7 % (ref 38.4–49.9)
HEMOGLOBIN: 14.8 g/dL (ref 13.0–17.1)
LYMPH#: 5.2 10*3/uL — AB (ref 0.9–3.3)
LYMPH%: 38.5 % (ref 14.0–49.0)
MCH: 31.1 pg (ref 27.2–33.4)
MCHC: 33.1 g/dL (ref 32.0–36.0)
MCV: 93.8 fL (ref 79.3–98.0)
MONO#: 1.1 10*3/uL — ABNORMAL HIGH (ref 0.1–0.9)
MONO%: 8 % (ref 0.0–14.0)
NEUT%: 49.1 % (ref 39.0–75.0)
NEUTROS ABS: 6.6 10*3/uL — AB (ref 1.5–6.5)
Platelets: 430 10*3/uL — ABNORMAL HIGH (ref 140–400)
RBC: 4.77 10*6/uL (ref 4.20–5.82)
RDW: 14.8 % — ABNORMAL HIGH (ref 11.0–14.6)
WBC: 13.5 10*3/uL — AB (ref 4.0–10.3)

## 2014-08-27 LAB — COMPREHENSIVE METABOLIC PANEL (CC13)
ALT: 16 U/L (ref 0–55)
AST: 20 U/L (ref 5–34)
Albumin: 3.1 g/dL — ABNORMAL LOW (ref 3.5–5.0)
Alkaline Phosphatase: 98 U/L (ref 40–150)
Anion Gap: 8 mEq/L (ref 3–11)
BUN: 19.9 mg/dL (ref 7.0–26.0)
CALCIUM: 9.3 mg/dL (ref 8.4–10.4)
CHLORIDE: 103 meq/L (ref 98–109)
CO2: 27 mEq/L (ref 22–29)
Creatinine: 1 mg/dL (ref 0.7–1.3)
EGFR: 66 mL/min/{1.73_m2} — AB (ref >90)
GLUCOSE: 112 mg/dL (ref 70–140)
Potassium: 4.1 mEq/L (ref 3.5–5.1)
Sodium: 138 mEq/L (ref 136–145)
Total Bilirubin: 0.45 mg/dL (ref 0.20–1.20)
Total Protein: 7 g/dL (ref 6.4–8.3)

## 2014-08-27 NOTE — Progress Notes (Signed)
[facility] Clinical Social Work  Clinical Social Work met with patient/family at [employer] appointment to offer support and assess for psychosocial needs.  Patient was accompanied by his niece/POA.  Mr. Bufano [place] with many family members that live nearby.  Patient's niece reported her and farm employee provide patients meals, assist with bathing, and all other needs. Mr. Bayona enjoys listening to [place] and attending church on Sundays.  Mr. Salay is agreeable to the current plan to receive PET scan and has no concerns at this time.    Clinical Social Work briefly discussed Clinical Social Work role and [employer] support programs/services.  Clinical Social Work encouraged patient to call with any additional questions or concerns.   Polo Riley, MSW, LCSW, OSW-C Clinical Social Worker [facility] [phone]

## 2014-08-27 NOTE — Progress Notes (Signed)
[place] [phone]   [phone] Multidisciplinary thoracic oncology clinic  CONSULT NOTE  REFERRING PHYSICIAN: Dr. Elsie Stain  REASON FOR CONSULTATION:  81 years old white male with questionable lung cancer.  HPI Seth Hunter is a 81 y.o. male a never smoker with past medical history significant for hypertension, benign prostatic hypertrophy, pacemaker placement, history of pneumonia, lower GI bleed as well as history of dysphagia. The patient has been complaining of swelling of the right lower extremity and he was brought by his niece to the emergency department for evaluation of his condition. Gallbladder or the lower extremities showed the venous thrombosis noted in the right common femoral vein as well as the right femoral vein and right popliteal vein. No evidence for deep venous thrombosis or superficial phlebitis in the left. The patient was started on treatment with Xarelto followed by his primary care physician Dr. Damita Dunnings. His evaluation at the emergency department he was complaining of shortness of breath and CT angiogram of the chest was performed on 08/17/2014 and it showed no evidence for pulmonary embolism but there was 3.8 x 2.2 x 1.9 cm irregular right hilar mass with invasion of the adjacent distal right intersegmental pulmonary artery and proximal right middle lobe pulmonary arteries. Otherwise, no pulmonary emboli are seen. The mass is compatible with a primary lung carcinoma. Metastatic right hilar and mediastinal adenopathy. The patient was referred to me today for evaluation and recommendation regarding these findings. When seen today the patient has no specific complaints except for the arthritis in his knees as well as constipation. He denied having any significant chest pain but continues to have shortness breath at baseline and increased with exertion with occasional cough but no hemoptysis. He also has occasional aspiration. The  patient denied having any significant weight loss or night sweats. He has no headache or visual changes. Family history significant for father who died from heart attack in his 7 and mother died from old age at age 79. He also has a brother who died from lung cancer. The patient is single and has no children. He used to work in farming. He is still able to take care of some of his house activity. He has no history of smoking, alcohol or drug abuse.  HPI  Past Medical History  Diagnosis Date  . Syncope     Syncope in the setting of complete heart block status post permanent pacemaker placement on September 27, 2007  . Hypertension   . Dementia     thought this appears to be mild based on reports of family as of 8/11  . Elevated TSH     Mildly elevated TSH  . Atrioventricular block, complete   . Pacemaker -MDT   . COPD (chronic obstructive pulmonary disease) 04/1995    X-ray  . BPH (benign prostatic hyperplasia)   . Tremor   . Pneumonia 04/1995    Bilateral upper lobes  . Anemia 04/1995    Post op, 2nd to hemorrhage, left leg  . UTI (urinary tract infection) 05/1995  . DJD (degenerative joint disease), lumbar 04/1995    Spine, severe.   X-ray  . DJD (degenerative joint disease) 04/1995    Both shoulders, severe  . HOH (hard of hearing)   . Inguinal hernia   . Hip fracture   . UTI (urinary tract infection)   . COPD (chronic obstructive pulmonary disease)   . GIB (gastrointestinal bleeding)     presumed diverticular bleed,  02/2014 admission    Past Surgical History  Procedure Laterality Date  . Pacemaker insertion    . Cholecystectomy  1988  . Hernia repair  Years ago    Left inguinal  . Ventral hernia repair  04/1995    with intra abdominal adhesions, lysis incidental appendectomy  . Pulmonary embolism surgery  04/1995    Post-op  . 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 Hematoma, left leg  05/1995    Probably 2nd to anticoag  . Rbbb and left  anterior fascicular block & bifascular block  05/1995  . Fractured left wrist  1990    with old right posterior rib fracture  . Syncope comp heart block  9/10- 09/30/2007    HOSP Perm Pacer placed, HTN, dementia, mildly elevated TSH  . Pacer placement (dr. Lovena Le)  09/27/2007  . Ct angio chest  09/27/07    No PE, No acute prob  . Femur im nail Left 10/21/2012    Procedure: INTRAMEDULLARY (IM) NAIL FEMORAL;  Surgeon: Meredith Pel, MD;  Location: [facility];  Service: Orthopedics;  Laterality: Left;  . Cholecystectomy    . Fracture surgery  2014    L femur    Family History  Problem Relation Age of Onset  . Heart disease Father     Hardening of the arteries, heart trouble    Social History Social History  Substance Use Topics  . Smoking status: Never Smoker   . Smokeless tobacco: None  . Alcohol Use: No    Allergies  Allergen Reactions  . Penicillins Other (See Comments)    Childhood allergy    Current Outpatient Prescriptions  Medication Sig Dispense Refill  . docusate sodium (COLACE) 100 MG capsule Take 100 mg by mouth daily.    . finasteride (PROSCAR) 5 MG tablet Take 5 mg by mouth daily.    . Garlic 10 MG CAPS Take 1 capsule by mouth daily.    . hydrochlorothiazide (MICROZIDE) 12.5 MG capsule Take 12.5 mg by mouth daily.    . Menthol, Topical Analgesic, (BIOFREEZE) 4 % GEL Apply 1 application topically at bedtime. Applied to knees for arthritis    . XARELTO STARTER PACK 15 & 20 MG TBPK Take 15-20 mg by mouth as directed. Take as directed on package: Start with one '15mg'$  tablet by mouth twice a day with food. On Day 22, switch to one '20mg'$  tablet once a day with food. 51 each 0   No current facility-administered medications for this visit.    Review of Systems  Constitutional: positive for fatigue Eyes: negative Ears, nose, mouth, throat, and face: negative Respiratory: positive for cough and dyspnea on exertion Cardiovascular: negative Gastrointestinal:  negative Genitourinary:negative Integument/breast: negative Hematologic/lymphatic: negative Musculoskeletal:positive for arthralgias and muscle weakness Neurological: negative Behavioral/Psych: negative Endocrine: negative Allergic/Immunologic: negative  Physical Exam  HQI:ONGEX, healthy, no distress, well nourished and well developed SKIN: skin color, texture, turgor are normal, no rashes or significant lesions HEAD: Normocephalic, No masses, lesions, tenderness or abnormalities EYES: normal, PERRLA, Conjunctiva are pink and non-injected EARS: External ears normal, Canals clear OROPHARYNX:no exudate, no erythema and lips, buccal mucosa, and tongue normal  NECK: supple, no adenopathy, no JVD LYMPH:  no palpable lymphadenopathy, no hepatosplenomegaly LUNGS: clear to auscultation , and palpation HEART: regular rate & rhythm, no murmurs and no gallops ABDOMEN:abdomen soft, non-tender, obese, normal bowel sounds and no masses or organomegaly BACK: Back symmetric, no curvature., No CVA tenderness EXTREMITIES:no joint deformities, effusion,  or inflammation, no edema, no skin discoloration  NEURO: alert & oriented x 3 with fluent speech, no focal motor/sensory deficits  PERFORMANCE STATUS: ECOG 2  LABORATORY DATA: Lab Results  Component Value Date   WBC 13.5* 08/27/2014   HGB 14.8 08/27/2014   HCT 44.7 08/27/2014   MCV 93.8 08/27/2014   PLT 430* 08/27/2014      Chemistry      Component Value Date/Time   NA 138 08/27/2014 1342   NA 137 08/17/2014 1358   K 4.1 08/27/2014 1342   K 4.3 08/17/2014 1358   CL 105 08/17/2014 1358   CO2 27 08/27/2014 1342   CO2 27 08/17/2014 1358   BUN 19.9 08/27/2014 1342   BUN 16 08/17/2014 1358   CREATININE 1.0 08/27/2014 1342   CREATININE 0.95 08/17/2014 1358      Component Value Date/Time   CALCIUM 9.3 08/27/2014 1342   CALCIUM 8.7* 08/17/2014 1358   ALKPHOS 98 08/27/2014 1342   ALKPHOS 71 02/28/2014 0500   AST 20 08/27/2014 1342    AST 24 02/28/2014 0500   ALT 16 08/27/2014 1342   ALT 19 02/28/2014 0500   BILITOT 0.45 08/27/2014 1342   BILITOT 0.9 02/28/2014 0500       RADIOGRAPHIC STUDIES: Ct Angio Chest Pe W/cm &/or Wo Cm  08/17/2014   CLINICAL DATA:  Acute right leg swelling. No chest pain or shortness of breath.  EXAM: CT ANGIOGRAPHY CHEST WITH CONTRAST  TECHNIQUE: Multidetector CT imaging of the chest was performed using the standard protocol during bolus administration of intravenous contrast. Multiplanar CT image reconstructions and MIPs were obtained to evaluate the vascular anatomy.  CONTRAST:  169m OMNIPAQUE IOHEXOL 350 MG/ML SOLN  COMPARISON:  Chest radiographs dated 01/07/2013 and chest CTA dated 09/27/2007.  FINDINGS: There is an irregular, low density right hilar mass measuring 2.2 cm in transverse diameter on image number 48 of series 5 and 3.8 x 1.9 cm on sagittal image number 62 of series 9. This has small, focal extensions into the adjacent distal intersegmental pulmonary artery on the right and into the adjacent proximal right middle lobe pulmonary arteries. No other pulmonary arterial filling defects are seen.  Also demonstrated are enlarged mediastinal and right hilar lymph nodes. These include a right hilar node with a short axis diameter of 16 mm on image number 40 for, a lower right paratracheal node with a short axis diameter of 14 mm on image number 32 and and adjacent more superiorly located right paratracheal node with a short axis diameter of 15 mm on image number 29.  No lung nodules are identified. There is coarse prominence of the interstitial markings in both lungs. No pleural fluid. Atheromatous arterial calcifications are noted, including the coronary arteries. There is also a small sliding hiatal hernia.  Thoracic spine degenerative changes.  Unremarkable upper abdomen.  Review of the MIP images confirms the above findings.  IMPRESSION: 1. 3.8 x 2.2 x 1.9 cm irregular right hilar mass with  invasion of the adjacent distal right intersegmental pulmonary artery and proximal right middle lobe pulmonary arteries. Otherwise, no pulmonary emboli are seen. The mass is compatible with a primary lung carcinoma. 2. Metastatic right hilar and mediastinal adenopathy. 3. Chronic interstitial lung disease. 4. Small sliding hiatal hernia.   Electronically Signed   By: SClaudie ReveringM.D.   On: 08/17/2014 17:51    ASSESSMENT: This is a very pleasant 81years old white male with questionable stage III a non-small cell lung cancer  pending tissue diagnosis, but other etiology like infectious process cannot be ruled out at this point.   PLAN: I had a lengthy discussion with the patient and his niece today about his current condition. I showed them the images of the CT scan of the chest. I recommended for the patient to have a PET scan performed next week for further evaluation of his condition. If the lesions are hypermetabolic on the PET scan, I would refer the patient for consideration of bronchoscopy and endobronchial ultrasound for tissue diagnosis. I will arrange for the patient to come back for follow-up visit in 2 weeks for reevaluation and discussion of his treatment options based on the PET scan results. He was advised to call immediately if he has any concerning symptoms in the interval. The patient voices understanding of current disease status and treatment options and is in agreement with the current care plan.  All questions were answered. The patient knows to call the clinic with any problems, questions or concerns. We can certainly see the patient much sooner if necessary.  Thank you so much for allowing me to participate in the care of Seth Hunter. I will continue to follow up the patient with you and assist in his care.  I spent 40 minutes counseling the patient face to face. The total time spent in the appointment was 60 minutes.  Disclaimer: This note was dictated with voice  recognition software. Similar sounding words can inadvertently be transcribed and may not be corrected upon review.   Caylyn Tedeschi K. August 27, 2014, 2:50 PM

## 2014-08-27 NOTE — Therapy (Signed)
[place], [place], [address] Phone: [phone]   Fax:  [phone]  Physical Therapy Evaluation  Patient Details  Name: Seth Hunter MRN: [id number] Date of Birth: 1918/04/19 Referring Provider:  Curt Bears, MD  Encounter Date: 08/27/2014      PT End of Session - 08/27/14 1541    Visit Number 1   Number of Visits 1   PT Start Time 1450   PT Stop Time 1515   PT Time Calculation (min) 25 min   Activity Tolerance Patient tolerated treatment well   Behavior During Therapy [facility] for tasks assessed/performed      Past Medical History  Diagnosis Date  . Syncope     Syncope in the setting of complete heart block status post permanent pacemaker placement on September 27, 2007  . Hypertension   . Dementia     thought this appears to be mild based on reports of family as of 8/11  . Elevated TSH     Mildly elevated TSH  . Atrioventricular block, complete   . Pacemaker -MDT   . COPD (chronic obstructive pulmonary disease) 04/1995    X-ray  . BPH (benign prostatic hyperplasia)   . Tremor   . Pneumonia 04/1995    Bilateral upper lobes  . Anemia 04/1995    Post op, 2nd to hemorrhage, left leg  . UTI (urinary tract infection) 05/1995  . DJD (degenerative joint disease), lumbar 04/1995    Spine, severe.   X-ray  . DJD (degenerative joint disease) 04/1995    Both shoulders, severe  . HOH (hard of hearing)   . Inguinal hernia   . Hip fracture   . UTI (urinary tract infection)   . COPD (chronic obstructive pulmonary disease)   . GIB (gastrointestinal bleeding)     presumed diverticular bleed, 02/2014 admission    Past Surgical History  Procedure Laterality Date  . Pacemaker insertion    . Cholecystectomy  1988  . Hernia repair  Years ago    Left inguinal  . Ventral hernia repair  04/1995    with intra abdominal adhesions, lysis incidental appendectomy  . Pulmonary embolism surgery  04/1995     Post-op  . 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 Hematoma, left leg  05/1995    Probably 2nd to anticoag  . 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 Fractured left wrist  1990    with old right posterior rib fracture  . Syncope comp heart block  9/10- 09/30/2007    HOSP Perm Pacer placed, HTN, dementia, mildly elevated TSH  . Pacer placement (dr. Lovena Le)  09/27/2007  . Ct angio chest  09/27/07    No PE, No acute prob  . Femur im nail Left 10/21/2012    Procedure: INTRAMEDULLARY (IM) NAIL FEMORAL;  Surgeon: Meredith Pel, MD;  Location: [facility];  Service: Orthopedics;  Laterality: Left;  . Cholecystectomy    . Fracture surgery  2014    L femur    There were no vitals filed for this visit.  Visit Diagnosis:  Mobility impaired - Plan: PT plan of care cert/re-cert  Stiffness of joints, multiple sites - Plan: PT plan of care cert/re-cert  Balance problem - Plan: PT plan of care cert/re-cert      Subjective Assessment - 08/27/14 1527    Subjective Niece spoke for patient to some extent; has good family  support.  Needs assist for most ADLs.   Patient is accompained by: Family member  Niece   Pertinent History Patient presented to ED with right leg swelling; pulmonary nodule was found when CT was done.  Has right hilar mass; no pathology yet for that; has mediastinal adenopathy and nothing noted peripherally.  Never smoker.  HTN, some dementia, COPD, PBH, lumbar and both shoulder DJD; HOH, h/o of hip fracture.   Patient Stated Goals none today   Currently in Pain? No/denies            [facility] PT Assessment - 08/27/14 0001    Assessment   Medical Diagnosis right hilar mass with mediastinal adenopathy, not yet identified   Precautions   Precautions Fall;Other (comment)  HOH, cancer precautions   Restrictions   Weight Bearing Restrictions No   Balance Screen   Has the patient fallen in the past 6 months No   Has the  patient had a decrease in activity level because of a fear of falling?  Yes  patient gets assist from family for all mobility   Is the patient reluctant to leave their home because of a fear of falling?  No   Home Environment   Living Environment Private residence   Living Arrangements Alone   Available Help at Discharge Family  niece + other family all live nearby--down the street   Type of [place] Two level  uses only first floor   [place] Bedside commode   Prior Function   Level of Independence Needs assistance with ADLs;Needs assistance with homemaking;Needs assistance with gait;Needs assistance with transfers;Other (comment)  can transfer recliner to commode independently   Cognition   Overall Cognitive Status Difficult to assess   Observation/Other Assessments   Observations older gentleman looking his age of 81 years, in [place]; hard of hearing, but responds to questions appropriately; has a tremor in hands   Functional Tests   Functional tests Sit to Stand  needs assist to come to standing   Posture/Postural Control   Posture/Postural Control Postural limitations   Postural Limitations Forward head;Flexed trunk;Rounded Shoulders   Posture Comments needs assist to maintain standing   ROM / Strength   AROM / PROM / Strength AROM   AROM   Overall AROM Comments moderate limitations most joints on gross assessment; apparent arthritis limits motion   Ambulation/Gait   Ambulation/Gait Yes   Ambulation/Gait Assistance 4: Min assist   Assistive device Rollator;Straight cane   Balance   Balance Assessed Yes   Static Standing Balance   Static Standing - Level of Assistance 4: Min assist                           PT Education - 08/27/14 1539    Education provided Yes   Education Details posture, breathing, cough splinting, PT info   Person(s) Educated Patient;Other (comment)  niece   Methods Explanation;Demonstration;Handout    Comprehension Verbalized understanding               Lung Clinic Goals - 08/27/14 1545    Patient will be able to verbalize understanding of the benefit of exercise to decrease fatigue.   Status Deferred   Patient will be able to verbalize the importance of posture.   Status Achieved   Patient will be able to demonstrate diaphragmatic breathing for improved lung function.   Status Achieved   Patient will be able to verbalize  understanding of the role of physical therapy to prevent functional decline and who to contact if physical therapy is needed.   Status Achieved             Plan - 08/28/14 1542    Clinical Impression Statement 81 year old man who is hard of hearing, arthritic, and has difficulty with functional mobility and balance, now with probable lung cancer diagnosis (awaiting biopsy and pathology). He lives alone but has an [place] support network of family who all live nearby and come in to help him several times a day.     Pt will benefit from skilled therapeutic intervention in order to improve on the following deficits Decreased mobility   Rehab Potential Fair   PT Frequency One time visit   PT Treatment/Interventions Patient/family education   PT Next Visit Plan None at this time; family assist network seems to work well for this patient to help him with mobility.   PT Home Exercise Plan see education   Recommended Other Services none at this time   Consulted and Agree with Plan of Care Patient;Family member/caregiver   Family Member Consulted niece          G-Codes - 2014/08/28 1546    Functional Assessment Tool Used clinical judgement   Functional Limitation Mobility: Walking and moving around   Mobility: Walking and Moving Around Current Status [phone]) At least 60 percent but less than 80 percent impaired, limited or restricted   Mobility: Walking and Moving Around Goal Status [phone]) At least 60 percent but less than 80 percent impaired,  limited or restricted   Mobility: Walking and Moving Around Discharge Status [phone]) At least 60 percent but less than 80 percent impaired, limited or restricted       Problem List Patient Active Problem List   Diagnosis Date Noted  . Lung mass 08/20/2014  . Bilateral impacted cerumen 03/16/2014  . Abnormal TSH 03/16/2014  . Lower GI bleed 02/27/2014  . Pacemaker 02/27/2014  . Other dysphagia 09/03/2013  . PNA (pneumonia) 02/13/2013  . Unspecified vitamin D deficiency 02/13/2013  . BPH (benign prostatic hyperplasia) 10/21/2012  . Closed left hip fracture 10/20/2012  . Pacemaker -MDT   . Essential hypertension 09/26/2007    Foye Damron 08-28-14, 3:48 PM  [place], [place], [address] Phone: [phone]   Fax:  [place], PT 08-28-2014 3:48 PM

## 2014-08-27 NOTE — Progress Notes (Signed)
Oncology Nurse Navigator Documentation  Oncology Nurse Navigator Flowsheets 08/27/2014  Navigator Encounter Type Clinic/MDC/spoke with patient and family at thoracic clinic today.  Questions and concerns addressed  Patient Visit Type Medonc  Treatment Phase Abnormal Scans  Barriers/Navigation Needs Education  Interventions -  Time Spent with Patient 30

## 2014-08-27 NOTE — Telephone Encounter (Signed)
per pof to sch pt appt-sent Dr Delene Ruffini email to adv no openings on his sch-adv pt will call w/appt time & date oncwe reply-adv [facility] will call to [facility] scan. Pt understood

## 2014-08-28 ENCOUNTER — Telehealth: Payer: Self-pay | Admitting: Internal Medicine

## 2014-08-28 NOTE — Telephone Encounter (Signed)
per reply from Dr Julien Nordmann to sch  8/24-cld left message and gave pt appt time & date

## 2014-09-08 ENCOUNTER — Ambulatory Visit (HOSPITAL_COMMUNITY)
Admission: RE | Admit: 2014-09-08 | Discharge: 2014-09-08 | Disposition: A | Discharge status: Home / Self Care | Payer: Medicare Other | Source: Ambulatory Visit | Attending: Internal Medicine | Admitting: Internal Medicine

## 2014-09-08 DIAGNOSIS — N201 Calculus of ureter: Secondary | ICD-10-CM | POA: Diagnosis not present

## 2014-09-08 DIAGNOSIS — Z79899 Other long term (current) drug therapy: Secondary | ICD-10-CM | POA: Diagnosis not present

## 2014-09-08 DIAGNOSIS — R918 Other nonspecific abnormal finding of lung field: Secondary | ICD-10-CM | POA: Diagnosis not present

## 2014-09-08 DIAGNOSIS — K4090 Unilateral inguinal hernia, without obstruction or gangrene, not specified as recurrent: Secondary | ICD-10-CM | POA: Diagnosis not present

## 2014-09-08 DIAGNOSIS — R590 Localized enlarged lymph nodes: Secondary | ICD-10-CM | POA: Diagnosis not present

## 2014-09-08 LAB — GLUCOSE, CAPILLARY: Glucose-Capillary: 97 mg/dL (ref 65–99)

## 2014-09-08 MED ORDER — FLUDEOXYGLUCOSE F - 18 (FDG) INJECTION
9.5000 | Freq: Once | INTRAVENOUS | Status: DC | PRN
  Administered 2014-09-08: 9.5 via INTRAVENOUS
  Filled 2014-09-08: qty 9.5

## 2014-09-09 ENCOUNTER — Ambulatory Visit (HOSPITAL_BASED_OUTPATIENT_CLINIC_OR_DEPARTMENT_OTHER): Payer: Medicare Other | Admitting: Internal Medicine

## 2014-09-09 ENCOUNTER — Encounter: Payer: Self-pay | Admitting: Internal Medicine

## 2014-09-09 VITALS — BP 122/70 | HR 95 | Temp 98.4°F | Resp 17 | Ht 73.0 in | Wt 189.0 lb

## 2014-09-09 DIAGNOSIS — R918 Other nonspecific abnormal finding of lung field: Secondary | ICD-10-CM

## 2014-09-09 NOTE — Progress Notes (Signed)
[place] [phone]   [phone]) [phone]  OFFICE PROGRESS NOTE  Elsie Stain, MD [place] [address]  DIAGNOSIS: Highly suspicious of stage IIIA lung cancer, pending tissue diagnosis  PRIOR THERAPY: None  CURRENT THERAPY: None  INTERVAL HISTORY: Seth Hunter 81 y.o. male returns to the clinic today for follow-up visit accompanied by his niece Iran. The patient is feeling fine today was no specific complaints. He denied having any significant chest pain, shortness of breath, cough or hemoptysis. No significant weight loss or night sweats. He has no nausea or vomiting. He continues to have generalized weakness consistent with his age. He had recent PET scan and he is here for evaluation and discussion of his scan results and treatment options.  MEDICAL HISTORY: Past Medical History  Diagnosis Date  . Syncope     Syncope in the setting of complete heart block status post permanent pacemaker placement on September 27, 2007  . Hypertension   . Dementia     thought this appears to be mild based on reports of family as of 8/11  . Elevated TSH     Mildly elevated TSH  . Atrioventricular block, complete   . Pacemaker -MDT   . COPD (chronic obstructive pulmonary disease) 04/1995    X-ray  . BPH (benign prostatic hyperplasia)   . Tremor   . Pneumonia 04/1995    Bilateral upper lobes  . Anemia 04/1995    Post op, 2nd to hemorrhage, left leg  . UTI (urinary tract infection) 05/1995  . DJD (degenerative joint disease), lumbar 04/1995    Spine, severe.   X-ray  . DJD (degenerative joint disease) 04/1995    Both shoulders, severe  . HOH (hard of hearing)   . Inguinal hernia   . Hip fracture   . UTI (urinary tract infection)   . COPD (chronic obstructive pulmonary disease)   . GIB (gastrointestinal bleeding)     presumed diverticular bleed, 02/2014 admission    ALLERGIES:  is allergic to penicillins.  MEDICATIONS:    Current Outpatient Prescriptions  Medication Sig Dispense Refill  . docusate sodium (COLACE) 100 MG capsule Take 100 mg by mouth daily.    . finasteride (PROSCAR) 5 MG tablet Take 5 mg by mouth daily.    . Garlic 10 MG CAPS Take 1 capsule by mouth daily.    . hydrochlorothiazide (MICROZIDE) 12.5 MG capsule Take 12.5 mg by mouth daily.    . Menthol, Topical Analgesic, (BIOFREEZE) 4 % GEL Apply 1 application topically at bedtime. Applied to knees for arthritis    . XARELTO STARTER PACK 15 & 20 MG TBPK Take 15-20 mg by mouth as directed. Take as directed on package: Start with one '15mg'$  tablet by mouth twice a day with food. On Day 22, switch to one '20mg'$  tablet once a day with food. 51 each 0   No current facility-administered medications for this visit.   Facility-Administered Medications Ordered in Other Visits  Medication Dose Route Frequency Provider Last Rate Last Dose  . fludeoxyglucose F - 18 (FDG) injection 9.5 milli Curie  9.5 milli Curie Intravenous Once PRN Medication Radiologist, MD   9.5 milli Curie at 09/08/14 0850    SURGICAL HISTORY:  Past Surgical History  Procedure Laterality Date  . Pacemaker insertion    . Cholecystectomy  1988  . Hernia repair  Years ago    Left inguinal  . Ventral hernia repair  04/1995  with intra abdominal adhesions, lysis incidental appendectomy  . Pulmonary embolism surgery  04/1995    Post-op  . 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 Hematoma, left leg  05/1995    Probably 2nd to anticoag  . 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 Fractured left wrist  1990    with old right posterior rib fracture  . Syncope comp heart block  9/10- 09/30/2007    HOSP [facility] Pacer placed, HTN, dementia, mildly elevated TSH  . Pacer placement (dr. Lovena Le)  09/27/2007  . Ct angio chest  09/27/07    No PE, No acute prob  . Femur im nail Left 10/21/2012    Procedure: INTRAMEDULLARY (IM) NAIL FEMORAL;  Surgeon:  Meredith Pel, MD;  Location: [facility];  Service: Orthopedics;  Laterality: Left;  . Cholecystectomy    . Fracture surgery  2014    L femur    REVIEW OF SYSTEMS:  A comprehensive review of systems was negative except for: Musculoskeletal: positive for muscle weakness   PHYSICAL EXAMINATION: General appearance: alert, cooperative, fatigued and no distress Head: Normocephalic, without obvious abnormality, atraumatic Neck: no adenopathy, no JVD, supple, symmetrical, trachea midline and thyroid not enlarged, symmetric, no tenderness/mass/nodules Lymph nodes: Cervical, supraclavicular, and axillary nodes normal. Resp: clear to auscultation bilaterally Back: symmetric, no curvature. ROM normal. No CVA tenderness. Cardio: regular rate and rhythm, S1, S2 normal, no murmur, click, rub or gallop GI: soft, non-tender; bowel sounds normal; no masses,  no organomegaly Extremities: extremities normal, atraumatic, no cyanosis or edema  ECOG PERFORMANCE STATUS: 2 - Symptomatic, <50% confined to bed  Blood pressure 122/70, pulse 95, temperature 98.4 F (36.9 C), temperature source Oral, resp. rate 17, height '6\' 1"'$  (1.854 m), weight 189 lb (85.73 kg), SpO2 97 %.  LABORATORY DATA: Lab Results  Component Value Date   WBC 13.5* 08/27/2014   HGB 14.8 08/27/2014   HCT 44.7 08/27/2014   MCV 93.8 08/27/2014   PLT 430* 08/27/2014      Chemistry      Component Value Date/Time   NA 138 08/27/2014 1342   NA 137 08/17/2014 1358   K 4.1 08/27/2014 1342   K 4.3 08/17/2014 1358   CL 105 08/17/2014 1358   CO2 27 08/27/2014 1342   CO2 27 08/17/2014 1358   BUN 19.9 08/27/2014 1342   BUN 16 08/17/2014 1358   CREATININE 1.0 08/27/2014 1342   CREATININE 0.95 08/17/2014 1358      Component Value Date/Time   CALCIUM 9.3 08/27/2014 1342   CALCIUM 8.7* 08/17/2014 1358   ALKPHOS 98 08/27/2014 1342   ALKPHOS 71 02/28/2014 0500   AST 20 08/27/2014 1342   AST 24 02/28/2014 0500   ALT 16 08/27/2014 1342    ALT 19 02/28/2014 0500   BILITOT 0.45 08/27/2014 1342   BILITOT 0.9 02/28/2014 0500       RADIOGRAPHIC STUDIES: Ct Angio Chest Pe W/cm &/or Wo Cm  08/17/2014   CLINICAL DATA:  Acute right leg swelling. No chest pain or shortness of breath.  EXAM: CT ANGIOGRAPHY CHEST WITH CONTRAST  TECHNIQUE: Multidetector CT imaging of the chest was performed using the standard protocol during bolus administration of intravenous contrast. Multiplanar CT image reconstructions and MIPs were obtained to evaluate the vascular anatomy.  CONTRAST:  17m OMNIPAQUE IOHEXOL 350 MG/ML SOLN  COMPARISON:  Chest radiographs dated 01/07/2013 and chest CTA dated 09/27/2007.  FINDINGS: There is an  irregular, low density right hilar mass measuring 2.2 cm in transverse diameter on image number 48 of series 5 and 3.8 x 1.9 cm on sagittal image number 62 of series 9. This has small, focal extensions into the adjacent distal intersegmental pulmonary artery on the right and into the adjacent proximal right middle lobe pulmonary arteries. No other pulmonary arterial filling defects are seen.  Also demonstrated are enlarged mediastinal and right hilar lymph nodes. These include a right hilar node with a short axis diameter of 16 mm on image number 40 for, a lower right paratracheal node with a short axis diameter of 14 mm on image number 32 and and adjacent more superiorly located right paratracheal node with a short axis diameter of 15 mm on image number 29.  No lung nodules are identified. There is coarse prominence of the interstitial markings in both lungs. No pleural fluid. Atheromatous arterial calcifications are noted, including the coronary arteries. There is also a small sliding hiatal hernia.  Thoracic spine degenerative changes.  Unremarkable upper abdomen.  Review of the MIP images confirms the above findings.  IMPRESSION: 1. 3.8 x 2.2 x 1.9 cm irregular right hilar mass with invasion of the adjacent distal right intersegmental  pulmonary artery and proximal right middle lobe pulmonary arteries. Otherwise, no pulmonary emboli are seen. The mass is compatible with a primary lung carcinoma. 2. Metastatic right hilar and mediastinal adenopathy. 3. Chronic interstitial lung disease. 4. Small sliding hiatal hernia.   Electronically Signed   By: Claudie Revering M.D.   On: 08/17/2014 17:51   Nm Pet Image Initial (pi) Skull Base To Thigh  09/08/2014   CLINICAL DATA:  Initial treatment strategy for lung mass.  EXAM: NUCLEAR MEDICINE PET SKULL BASE TO THIGH  TECHNIQUE: 9.5 mCi F-18 FDG was injected intravenously. Full-ring PET imaging was performed from the skull base to thigh after the radiotracer. CT data was obtained and used for attenuation correction and anatomic localization.  FASTING BLOOD GLUCOSE:  Value: 97 mg/dl  COMPARISON:  Chest CT 08/17/2014  FINDINGS: NECK  No hypermetabolic lymph nodes in the neck.  CHEST  The right infrahilar mass and adjacent adenopathy shows hypermetabolism with SUV max of 4.5. The pretracheal adenopathy is also hypermetabolic with SUV max of 4.4. Borderline hypermetabolism in the prevascular nodes with SUV max of 2.6.  Underlying chronic lung disease and emphysema. No obvious hypermetabolic pulmonary nodules to suggest metastatic disease.  Diffuse uptake in the right shoulder joint likely due to inflammatory arthropathy.  ABDOMEN/PELVIS  No abnormal hypermetabolic activity within the liver, pancreas, adrenal glands, or spleen. No hypermetabolic lymph nodes in the abdomen or pelvis.  Chronic appearing left-sided hydronephrosis and multiple left renal calculi. There is a 10 mm calculus in the left ureter at the pelvic inlet which may be causing a mild chronic obstruction. The right kidney and ureter are normal.  Tortuous calcified abdominal aorta with branch vessel calcifications but no focal aneurysm.  Advanced diverticulosis involving the colon but no findings for acute diverticulitis.  Small anterior abdominal  wall are hernia containing a small bowel loop but no findings for obstruction. There are surgical changes involving the small bowel. There is also a right inguinal hernia containing a small bowel loop without obvious obstruction.  SKELETON  No findings to suggest osseous metastatic disease. Moderate degenerative changes involving the spine and hips.  IMPRESSION: 1. Right infrahilar mass and adjacent adenopathy are hypermetabolic and consistent with neoplasm. There is also hypermetabolic pretracheal adenopathy and borderline prevascular  nodes. 2. No findings for metastatic disease involving the abdomen/pelvis or osseous structures. 3. 10 mm left ureteral calculus at the pelvic inlet likely causing a chronic low-grade obstruction. 4. Anterior abdominal wall and right inguinal hernias containing small bowel loops without obvious incarceration or obstruction.   Electronically Signed   By: Marijo Sanes M.D.   On: 09/08/2014 11:46    ASSESSMENT AND PLAN: This is a very pleasant 81 years old white male with highly suspicious of stage IIIa lung cancer pending tissue diagnosis. The most recent PET scan showed the right infrahilar mass and adjacent lymphadenopathy that were hypermetabolic and consistent with neoplasm. I discussed the scan results with the patient and his niece.  I had a lengthy discussion with him about his current condition and further treatment options. I explained to the patient and his niece that if he is interested in receiving treatment which most likely would be in the form of palliative radiotherapy I would refer him to a pulmonologist for bronchoscopy and biopsy but with the patient has no interest in treatment, there will be no need to do further investigation and confirmation of his tissue diagnosis. The patient and his niece would like to have some time to think about that option and they will let me know if they decide to proceed with any treatment. I would not consider this patient for  treatment with systemic chemotherapy because of his age and comorbidities. The patient will continue his routine follow-up visit with his primary care physician at this point. The patient voices understanding of current disease status and treatment options and is in agreement with the current care plan.  All questions were answered. The patient knows to call the clinic with any problems, questions or concerns. We can certainly see the patient much sooner if necessary.  Disclaimer: This note was dictated with voice recognition software. Similar sounding words can inadvertently be transcribed and may not be corrected upon review.

## 2014-09-10 ENCOUNTER — Telehealth: Payer: Self-pay | Admitting: *Deleted

## 2014-09-10 NOTE — Telephone Encounter (Signed)
Oncology Nurse Navigator Documentation  Oncology Nurse Navigator Flowsheets 09/10/2014  Navigator Encounter Type Telephone/Called patient to check on him and his thoughts regarding tx.  The niece answered the phone and stated he did not want treatment.  I listened as she explained. I updated Dr. Julien Nordmann and he stated patient does not need a follow up appt.  I did state she can call the cancer center if needed.   Patient Visit Type Follow-up  Treatment Phase Abnormal Scans  Interventions Coordination of Care  Time Spent with Patient 15

## 2014-10-14 ENCOUNTER — Other Ambulatory Visit: Payer: Self-pay | Admitting: Family Medicine

## 2014-10-30 ENCOUNTER — Other Ambulatory Visit: Payer: Self-pay

## 2014-10-30 MED ORDER — HYDROCHLOROTHIAZIDE 12.5 MG PO CAPS: 12.5000 mg | ORAL_CAPSULE | Freq: Every day | ORAL | Status: DC

## 2014-10-30 NOTE — Telephone Encounter (Signed)
Have done prev, sent.  Haven't seen patient recently.  Can he have f/u here in the clinic? 30 min OV.  Thanks.

## 2014-10-30 NOTE — Telephone Encounter (Signed)
i did not see where this had ever been filled by you--please advise if okay to refill

## 2014-11-02 NOTE — Telephone Encounter (Signed)
Please schedule appointment as instructed. 

## 2014-11-05 NOTE — Telephone Encounter (Signed)
This medication was filled by Dr. Damita Dunnings on 10/30/14.

## 2014-11-05 NOTE — Telephone Encounter (Signed)
appt made for 11/10/14 at 12:15pm.  Does medication need to be filled or has this been done?

## 2014-11-10 ENCOUNTER — Ambulatory Visit (INDEPENDENT_AMBULATORY_CARE_PROVIDER_SITE_OTHER): Payer: Medicare Other | Admitting: Family Medicine

## 2014-11-10 ENCOUNTER — Encounter: Payer: Self-pay | Admitting: Family Medicine

## 2014-11-10 VITALS — BP 114/68 | HR 92 | Temp 97.9°F | Wt 185.0 lb

## 2014-11-10 DIAGNOSIS — I10 Essential (primary) hypertension: Secondary | ICD-10-CM

## 2014-11-10 DIAGNOSIS — L989 Disorder of the skin and subcutaneous tissue, unspecified: Secondary | ICD-10-CM | POA: Diagnosis not present

## 2014-11-10 DIAGNOSIS — Z66 Do not resuscitate: Secondary | ICD-10-CM | POA: Diagnosis not present

## 2014-11-10 MED ORDER — DOXYCYCLINE HYCLATE 100 MG PO TABS: 100.0000 mg | ORAL_TABLET | Freq: Two times a day (BID) | ORAL | Status: DC

## 2014-11-10 MED ORDER — HYDROCHLOROTHIAZIDE 12.5 MG PO CAPS: 12.5000 mg | ORAL_CAPSULE | Freq: Every day | ORAL | Status: DC

## 2014-11-10 NOTE — Progress Notes (Signed)
Pre visit review using our clinic review tool, if applicable. No additional management support is needed unless otherwise documented below in the visit note.  Hypertension:    Using medication without problems or lightheadedness: yes Chest pain with exertion:no Edema:much improved after DVT tx.  Short of breath:no  Lung mass.  No plan for bx or chemo.  Prev saw one.  He is aware of the lesion.  D/w pt, likely cancer.  He doesn't want treatment/biopsy.  Not SOB.  Still living at home.  Asp risk d/w pt.  Takes asp precautions.  D/w pt about possible PNA from a combination of factors.  Goals d/w pt.  He designated [employer] as HCPOA per our discussion.  He thought about options and said he "didn't want to be on machines.  If it's my time to go, it's my time to go."  D/w pt about comfort care given should his condition drastically worsen and he agreed that was reasonable.  I'll list him as DNR based on his expressed wishes.  Sandi was present for the discussion.    Skin lesions.  R hand, dorsum, near base of the thumb.  Papule.  Not ttp.   Lesion behind R ear, possibly rubbed from eyeglasses.  Tender.  Some occ drainage.    Meds, vitals, and allergies reviewed.   ROS: See HPI.  Otherwise, noncontributory.  nad Hard of hearing R posterior pinna with 2.5 cm raw area, not ulcerated but abraded with some slough.  No fluctuant mass.   Neck supple No LA rrr ctab abd soft Ext with trace edema R hand with 75m papule near the base of the thumb, dorsally.

## 2014-11-10 NOTE — Patient Instructions (Signed)
If the spot on your right hand isn't getting better, then we can try to freeze it off.  Start doxycycline for the irritation/infection on the right ear.  You can use hydrocortisone on the itchy spots on your face.  Don't change your regular meds for now.  Take care.  Glad to see you.

## 2014-11-11 DIAGNOSIS — Z66 Do not resuscitate: Secondary | ICD-10-CM | POA: Insufficient documentation

## 2014-11-11 DIAGNOSIS — L989 Disorder of the skin and subcutaneous tissue, unspecified: Secondary | ICD-10-CM | POA: Insufficient documentation

## 2014-11-11 NOTE — Assessment & Plan Note (Signed)
No plan for bx or chemo.  Prev saw one.  He is aware of the lesion.  D/w pt, likely cancer.  He doesn't want treatment/biopsy.  Not SOB.  Still living at home.  Asp risk d/w pt.  Takes asp precautions.  D/w pt about possible PNA from a combination of factors.  Goals d/w pt.  He designated [employer] as HCPOA per our discussion.  He thought about options and said he "didn't want to be on machines.  If it's my time to go, it's my time to go."  D/w pt about comfort care given should his condition drastically worsen and he agreed that was reasonable.  I'll list him as DNR based on his expressed wishes.  Sandi was present for the discussion.

## 2014-11-11 NOTE — Assessment & Plan Note (Signed)
Continue HCTZ as is.  >25 minutes spent in face to face time with patient, >50% spent in counselling or coordination of care.

## 2014-11-11 NOTE — Assessment & Plan Note (Signed)
R hand lesion could be a small SCC.  D/w pt. Wouldn't likely heal well with excision.  We may be able to freeze mult times to eliminate it.  Opts for observation for now, we can treat if not improved spontaneously in the next ~2 weeks.  Family to update me.  Start doxy for the superficial lesion on the R ear.  Okay for outpatient f/u.

## 2015-01-14 ENCOUNTER — Encounter: Payer: Self-pay | Admitting: *Deleted

## 2015-03-16 ENCOUNTER — Telehealth: Payer: Self-pay | Admitting: Family Medicine

## 2015-03-16 NOTE — Telephone Encounter (Signed)
I spoke with pts niece and POA; she has not seen pt today;Caregiver said before lunch pt was coughing really hard and could hardly keep his eyes open. Pt has not had a fever; pt has confusion on and off when first wakes up. Pt ate normally at lunch time but then began to nap again. Pts niece will leave work soon and ck on pt. She will take pt to ED if any sign of difficulty breathing, wheezing, fever, hard to arouse, or more confusion than usual. Pts niece will contact [facility] if needed to go to ED or will see Dr Damita Dunnings 03/17/15.FYI to Dr Damita Dunnings.

## 2015-03-16 NOTE — Telephone Encounter (Signed)
Pt has 30 min appt 03/17/15 at 6pm with Dr Damita Dunnings. Unable to reach pts niece or caregiver by phone to ck on pts status of going to ED.

## 2015-03-16 NOTE — Telephone Encounter (Signed)
Patient Name: Seth Hunter  DOB: 06-22-1918    Initial Comment Caller states her uncle is showing signs of the flu or pneumonia. Confused, can't keep his eyes open.    Nurse Assessment  Nurse: Mallie Mussel, RN, Alveta Heimlich Date/Time Eilene Ghazi Time): 03/16/2015 1:16:30 PM  Confirm and document reason for call. If symptomatic, describe symptoms. You must click the next button to save text entered. ---Caller states that her uncle may have the flu or pneumonia. He is confused at times. That is new and different for him. She is not with him. They have a care taker sitting with him while she is at work. 3 way call offered. The care taker's name is Cathren Harsh [phone]. I have been unable to reach the care taker. Caller advised. She wants to go ahead to schedule an appointment for him. Once she is back with him, she can make the determination if he needs to go to ER or not.  Has the patient traveled out of the country within the last 30 days? ---Not Applicable  Does the patient have any new or worsening symptoms? ---Yes  Will a triage be completed? ---No  Select reason for no triage. ---Other     Guidelines    Guideline Title Affirmed Question Affirmed Notes       Final Disposition User        Comments  Caller wanted to go ahead and schedule an appointment with Dr. Damita Dunnings for her uncle. I was able to schedule a 30 minute appointment for 6:00pm tomorrow evening. I had tried twice to reach the caregiver to do an assessment, but each time I received his voicemail. I advised caller that when she gets with him this evening, she can call us and we can do an assesssment at that time to see if we need to recommend he go to the ER. She verbalized understanding.

## 2015-03-16 NOTE — Telephone Encounter (Signed)
Patient's niece called back.  She has not taken the patient to the ER because his condition has not worsened.  She said she would be at [facility] tomorrow 03/17/15 at 6pm for her uncles appt.

## 2015-03-17 ENCOUNTER — Encounter: Payer: Self-pay | Admitting: Family Medicine

## 2015-03-17 ENCOUNTER — Ambulatory Visit (INDEPENDENT_AMBULATORY_CARE_PROVIDER_SITE_OTHER): Payer: Medicare Other | Admitting: Family Medicine

## 2015-03-17 VITALS — BP 130/80 | HR 60 | Temp 98.0°F

## 2015-03-17 DIAGNOSIS — L989 Disorder of the skin and subcutaneous tissue, unspecified: Secondary | ICD-10-CM

## 2015-03-17 DIAGNOSIS — R05 Cough: Secondary | ICD-10-CM

## 2015-03-17 DIAGNOSIS — R059 Cough, unspecified: Secondary | ICD-10-CM

## 2015-03-17 NOTE — Patient Instructions (Addendum)
I would stop the coricidin.   Try 5-'10mg'$  of plain claritin instead.   That may help without making you drowsy.  Take care.  Glad to see you.

## 2015-03-17 NOTE — Progress Notes (Signed)
Pre visit review using our clinic review tool, if applicable. No additional management support is needed unless otherwise documented below in the visit note.  Sx started about Sunday evening.   Sick contacts.  No fevers.   Some cough.   Family didn't know if this started as allergies- recent weather changes and some plants blooming.  Taking OTC cough med/coricidin.   Sig improvement in the last few days.   Yesterday AM- patient was doing well.  Yesterday lunchtime/PM- family contracted about cough, fatigue, disorientation.  Recheck at home by family was reassuring yesterday afternoon- no fever, normal speech.  He was back to baseline last night.   Ate normally last night.    It was unclear if the coricidin made him sedated and caused the sx from yesterday afternoon, prompting the phone call to clinic.    Still with some cough.  No fevers.  Some sputum.  Eating well.  Swallowing well.  Doesn't feel SOB.    Meds, vitals, and allergies reviewed.   ROS: See HPI.  Otherwise, noncontributory.  nad Hard of hearing at baseline ncat R ear posterior is still irritated, but better from prev.  His glasses aren't rubbing as much now.   Mmm OP wnl Neck supple no LA rrr Ctab, scant cough noted.  No rales abd soft Ext with trace edema

## 2015-03-17 NOTE — Telephone Encounter (Signed)
Noted. Thanks.

## 2015-03-18 DIAGNOSIS — R05 Cough: Secondary | ICD-10-CM | POA: Insufficient documentation

## 2015-03-18 DIAGNOSIS — R059 Cough, unspecified: Secondary | ICD-10-CM | POA: Insufficient documentation

## 2015-03-18 NOTE — Assessment & Plan Note (Signed)
Improved, nontoxic.  Ctab.  Okay for outpatient f/u.  Likely with sedation from coricidin.  Would change to plain claritin and f/u prn.  D/w family.  Fu prn.  Okay for outpatient f/u.

## 2015-03-18 NOTE — Assessment & Plan Note (Signed)
Continue topical tx. Some better now.  His glasses aren't rubbing as much as prev.

## 2015-04-12 ENCOUNTER — Other Ambulatory Visit: Payer: Self-pay | Admitting: *Deleted

## 2015-04-12 MED ORDER — FINASTERIDE 5 MG PO TABS: 5.0000 mg | ORAL_TABLET | Freq: Every day | ORAL | Status: DC

## 2015-08-12 ENCOUNTER — Other Ambulatory Visit: Payer: Self-pay | Admitting: Family Medicine

## 2015-08-12 NOTE — Telephone Encounter (Signed)
Sent. Thanks.   

## 2015-10-25 ENCOUNTER — Other Ambulatory Visit: Payer: Self-pay | Admitting: Family Medicine

## 2015-11-18 ENCOUNTER — Telehealth: Payer: Self-pay | Admitting: Family Medicine

## 2015-11-18 NOTE — Telephone Encounter (Signed)
LVM for pt to call back and schedule AWV + labs with Lesia and CPE with PCP. °

## 2016-01-19 ENCOUNTER — Telehealth: Payer: Self-pay

## 2016-01-19 NOTE — Telephone Encounter (Signed)
Amy with Hospice notified as instructed by telephone.

## 2016-01-19 NOTE — Telephone Encounter (Signed)
Amy in referrals with [facility] left v/m requesting cb to see if Dr Damita Dunnings will be the attending for pt.

## 2016-01-19 NOTE — Telephone Encounter (Signed)
Yes, please.  I want hospice help/input.  Thanks.

## 2016-01-20 ENCOUNTER — Telehealth: Payer: Self-pay

## 2016-01-20 DIAGNOSIS — K922 Gastrointestinal hemorrhage, unspecified: Secondary | ICD-10-CM | POA: Diagnosis not present

## 2016-01-20 DIAGNOSIS — R1310 Dysphagia, unspecified: Secondary | ICD-10-CM | POA: Diagnosis not present

## 2016-01-20 DIAGNOSIS — R634 Abnormal weight loss: Secondary | ICD-10-CM | POA: Diagnosis not present

## 2016-01-20 DIAGNOSIS — J449 Chronic obstructive pulmonary disease, unspecified: Secondary | ICD-10-CM | POA: Diagnosis not present

## 2016-01-20 DIAGNOSIS — F0280 Dementia in other diseases classified elsewhere without behavioral disturbance: Secondary | ICD-10-CM | POA: Diagnosis not present

## 2016-01-20 DIAGNOSIS — I4430 Unspecified atrioventricular block: Secondary | ICD-10-CM | POA: Diagnosis not present

## 2016-01-20 DIAGNOSIS — R05 Cough: Secondary | ICD-10-CM | POA: Diagnosis not present

## 2016-01-20 DIAGNOSIS — C3401 Malignant neoplasm of right main bronchus: Secondary | ICD-10-CM | POA: Diagnosis not present

## 2016-01-20 DIAGNOSIS — M259 Joint disorder, unspecified: Secondary | ICD-10-CM | POA: Diagnosis not present

## 2016-01-20 DIAGNOSIS — N401 Enlarged prostate with lower urinary tract symptoms: Secondary | ICD-10-CM | POA: Diagnosis not present

## 2016-01-20 DIAGNOSIS — I10 Essential (primary) hypertension: Secondary | ICD-10-CM | POA: Diagnosis not present

## 2016-01-20 DIAGNOSIS — G250 Essential tremor: Secondary | ICD-10-CM | POA: Diagnosis not present

## 2016-01-20 NOTE — Telephone Encounter (Signed)
Hall Busing with [facility] left v/m; per pts chart Dr Damita Dunnings has pt as DNR but there is no form in the home to that effect. Helene Kelp wants to know if can have hospice doctor to sign DNR so can be in the home the first time hospice goes out. Helene Kelp request cb.

## 2016-01-21 DIAGNOSIS — R1310 Dysphagia, unspecified: Secondary | ICD-10-CM | POA: Diagnosis not present

## 2016-01-21 DIAGNOSIS — F0280 Dementia in other diseases classified elsewhere without behavioral disturbance: Secondary | ICD-10-CM | POA: Diagnosis not present

## 2016-01-21 DIAGNOSIS — C3401 Malignant neoplasm of right main bronchus: Secondary | ICD-10-CM | POA: Diagnosis not present

## 2016-01-21 DIAGNOSIS — R05 Cough: Secondary | ICD-10-CM | POA: Diagnosis not present

## 2016-01-21 DIAGNOSIS — R634 Abnormal weight loss: Secondary | ICD-10-CM | POA: Diagnosis not present

## 2016-01-21 DIAGNOSIS — J449 Chronic obstructive pulmonary disease, unspecified: Secondary | ICD-10-CM | POA: Diagnosis not present

## 2016-01-21 NOTE — Telephone Encounter (Signed)
Please give the order for DNR.  Thanks.

## 2016-01-21 NOTE — Telephone Encounter (Signed)
Hall Busing with [facility] advised.

## 2016-01-26 DIAGNOSIS — R634 Abnormal weight loss: Secondary | ICD-10-CM | POA: Diagnosis not present

## 2016-01-26 DIAGNOSIS — F0280 Dementia in other diseases classified elsewhere without behavioral disturbance: Secondary | ICD-10-CM | POA: Diagnosis not present

## 2016-01-26 DIAGNOSIS — J449 Chronic obstructive pulmonary disease, unspecified: Secondary | ICD-10-CM | POA: Diagnosis not present

## 2016-01-26 DIAGNOSIS — R1310 Dysphagia, unspecified: Secondary | ICD-10-CM | POA: Diagnosis not present

## 2016-01-26 DIAGNOSIS — R05 Cough: Secondary | ICD-10-CM | POA: Diagnosis not present

## 2016-01-26 DIAGNOSIS — C3401 Malignant neoplasm of right main bronchus: Secondary | ICD-10-CM | POA: Diagnosis not present

## 2016-01-28 ENCOUNTER — Other Ambulatory Visit: Payer: Self-pay | Admitting: Family Medicine

## 2016-01-28 DIAGNOSIS — R05 Cough: Secondary | ICD-10-CM | POA: Diagnosis not present

## 2016-01-28 DIAGNOSIS — C3401 Malignant neoplasm of right main bronchus: Secondary | ICD-10-CM | POA: Diagnosis not present

## 2016-01-28 DIAGNOSIS — R634 Abnormal weight loss: Secondary | ICD-10-CM | POA: Diagnosis not present

## 2016-01-28 DIAGNOSIS — R1310 Dysphagia, unspecified: Secondary | ICD-10-CM | POA: Diagnosis not present

## 2016-01-28 DIAGNOSIS — J449 Chronic obstructive pulmonary disease, unspecified: Secondary | ICD-10-CM | POA: Diagnosis not present

## 2016-01-28 DIAGNOSIS — F0280 Dementia in other diseases classified elsewhere without behavioral disturbance: Secondary | ICD-10-CM | POA: Diagnosis not present

## 2016-01-31 DIAGNOSIS — C3401 Malignant neoplasm of right main bronchus: Secondary | ICD-10-CM | POA: Diagnosis not present

## 2016-01-31 DIAGNOSIS — R1310 Dysphagia, unspecified: Secondary | ICD-10-CM | POA: Diagnosis not present

## 2016-01-31 DIAGNOSIS — F0280 Dementia in other diseases classified elsewhere without behavioral disturbance: Secondary | ICD-10-CM | POA: Diagnosis not present

## 2016-01-31 DIAGNOSIS — R05 Cough: Secondary | ICD-10-CM | POA: Diagnosis not present

## 2016-01-31 DIAGNOSIS — J449 Chronic obstructive pulmonary disease, unspecified: Secondary | ICD-10-CM | POA: Diagnosis not present

## 2016-01-31 DIAGNOSIS — R634 Abnormal weight loss: Secondary | ICD-10-CM | POA: Diagnosis not present

## 2016-02-04 DIAGNOSIS — R1310 Dysphagia, unspecified: Secondary | ICD-10-CM | POA: Diagnosis not present

## 2016-02-04 DIAGNOSIS — R634 Abnormal weight loss: Secondary | ICD-10-CM | POA: Diagnosis not present

## 2016-02-04 DIAGNOSIS — F0280 Dementia in other diseases classified elsewhere without behavioral disturbance: Secondary | ICD-10-CM | POA: Diagnosis not present

## 2016-02-04 DIAGNOSIS — J449 Chronic obstructive pulmonary disease, unspecified: Secondary | ICD-10-CM | POA: Diagnosis not present

## 2016-02-04 DIAGNOSIS — C3401 Malignant neoplasm of right main bronchus: Secondary | ICD-10-CM | POA: Diagnosis not present

## 2016-02-04 DIAGNOSIS — R05 Cough: Secondary | ICD-10-CM | POA: Diagnosis not present

## 2016-02-07 DIAGNOSIS — C3401 Malignant neoplasm of right main bronchus: Secondary | ICD-10-CM | POA: Diagnosis not present

## 2016-02-07 DIAGNOSIS — R1310 Dysphagia, unspecified: Secondary | ICD-10-CM | POA: Diagnosis not present

## 2016-02-07 DIAGNOSIS — R634 Abnormal weight loss: Secondary | ICD-10-CM | POA: Diagnosis not present

## 2016-02-07 DIAGNOSIS — R05 Cough: Secondary | ICD-10-CM | POA: Diagnosis not present

## 2016-02-07 DIAGNOSIS — J449 Chronic obstructive pulmonary disease, unspecified: Secondary | ICD-10-CM | POA: Diagnosis not present

## 2016-02-07 DIAGNOSIS — F0280 Dementia in other diseases classified elsewhere without behavioral disturbance: Secondary | ICD-10-CM | POA: Diagnosis not present

## 2016-02-08 DIAGNOSIS — J449 Chronic obstructive pulmonary disease, unspecified: Secondary | ICD-10-CM | POA: Diagnosis not present

## 2016-02-08 DIAGNOSIS — C3401 Malignant neoplasm of right main bronchus: Secondary | ICD-10-CM | POA: Diagnosis not present

## 2016-02-08 DIAGNOSIS — R1310 Dysphagia, unspecified: Secondary | ICD-10-CM | POA: Diagnosis not present

## 2016-02-08 DIAGNOSIS — R634 Abnormal weight loss: Secondary | ICD-10-CM | POA: Diagnosis not present

## 2016-02-08 DIAGNOSIS — F0280 Dementia in other diseases classified elsewhere without behavioral disturbance: Secondary | ICD-10-CM | POA: Diagnosis not present

## 2016-02-08 DIAGNOSIS — R05 Cough: Secondary | ICD-10-CM | POA: Diagnosis not present

## 2016-02-09 DIAGNOSIS — R634 Abnormal weight loss: Secondary | ICD-10-CM | POA: Diagnosis not present

## 2016-02-09 DIAGNOSIS — C3401 Malignant neoplasm of right main bronchus: Secondary | ICD-10-CM | POA: Diagnosis not present

## 2016-02-09 DIAGNOSIS — J449 Chronic obstructive pulmonary disease, unspecified: Secondary | ICD-10-CM | POA: Diagnosis not present

## 2016-02-09 DIAGNOSIS — R05 Cough: Secondary | ICD-10-CM | POA: Diagnosis not present

## 2016-02-09 DIAGNOSIS — F0280 Dementia in other diseases classified elsewhere without behavioral disturbance: Secondary | ICD-10-CM | POA: Diagnosis not present

## 2016-02-09 DIAGNOSIS — R1310 Dysphagia, unspecified: Secondary | ICD-10-CM | POA: Diagnosis not present

## 2016-02-11 DIAGNOSIS — J449 Chronic obstructive pulmonary disease, unspecified: Secondary | ICD-10-CM | POA: Diagnosis not present

## 2016-02-11 DIAGNOSIS — R634 Abnormal weight loss: Secondary | ICD-10-CM | POA: Diagnosis not present

## 2016-02-11 DIAGNOSIS — F0280 Dementia in other diseases classified elsewhere without behavioral disturbance: Secondary | ICD-10-CM | POA: Diagnosis not present

## 2016-02-11 DIAGNOSIS — R05 Cough: Secondary | ICD-10-CM | POA: Diagnosis not present

## 2016-02-11 DIAGNOSIS — C3401 Malignant neoplasm of right main bronchus: Secondary | ICD-10-CM | POA: Diagnosis not present

## 2016-02-11 DIAGNOSIS — R1310 Dysphagia, unspecified: Secondary | ICD-10-CM | POA: Diagnosis not present

## 2016-02-13 DIAGNOSIS — J449 Chronic obstructive pulmonary disease, unspecified: Secondary | ICD-10-CM | POA: Diagnosis not present

## 2016-02-13 DIAGNOSIS — R1310 Dysphagia, unspecified: Secondary | ICD-10-CM | POA: Diagnosis not present

## 2016-02-13 DIAGNOSIS — R634 Abnormal weight loss: Secondary | ICD-10-CM | POA: Diagnosis not present

## 2016-02-13 DIAGNOSIS — C3401 Malignant neoplasm of right main bronchus: Secondary | ICD-10-CM | POA: Diagnosis not present

## 2016-02-13 DIAGNOSIS — F0280 Dementia in other diseases classified elsewhere without behavioral disturbance: Secondary | ICD-10-CM | POA: Diagnosis not present

## 2016-02-13 DIAGNOSIS — R05 Cough: Secondary | ICD-10-CM | POA: Diagnosis not present

## 2016-02-14 ENCOUNTER — Emergency Department (HOSPITAL_COMMUNITY)

## 2016-02-14 ENCOUNTER — Encounter (HOSPITAL_COMMUNITY): Payer: Self-pay | Admitting: Emergency Medicine

## 2016-02-14 ENCOUNTER — Inpatient Hospital Stay (HOSPITAL_COMMUNITY)
Admission: EM | Admit: 2016-02-14 | Discharge: 2016-02-18 | DRG: 535 | Disposition: A | Discharge status: Skilled Nursing Facility | Attending: Internal Medicine | Admitting: Internal Medicine

## 2016-02-14 ENCOUNTER — Telehealth: Payer: Self-pay

## 2016-02-14 DIAGNOSIS — R946 Abnormal results of thyroid function studies: Secondary | ICD-10-CM | POA: Diagnosis present

## 2016-02-14 DIAGNOSIS — J449 Chronic obstructive pulmonary disease, unspecified: Secondary | ICD-10-CM | POA: Diagnosis present

## 2016-02-14 DIAGNOSIS — W19XXXA Unspecified fall, initial encounter: Secondary | ICD-10-CM | POA: Diagnosis present

## 2016-02-14 DIAGNOSIS — G9340 Encephalopathy, unspecified: Secondary | ICD-10-CM | POA: Diagnosis present

## 2016-02-14 DIAGNOSIS — D72820 Lymphocytosis (symptomatic): Secondary | ICD-10-CM | POA: Diagnosis not present

## 2016-02-14 DIAGNOSIS — S32512A Fracture of superior rim of left pubis, initial encounter for closed fracture: Secondary | ICD-10-CM | POA: Diagnosis not present

## 2016-02-14 DIAGNOSIS — Z880 Allergy status to penicillin: Secondary | ICD-10-CM

## 2016-02-14 DIAGNOSIS — N390 Urinary tract infection, site not specified: Secondary | ICD-10-CM | POA: Diagnosis not present

## 2016-02-14 DIAGNOSIS — F0280 Dementia in other diseases classified elsewhere without behavioral disturbance: Secondary | ICD-10-CM | POA: Diagnosis not present

## 2016-02-14 DIAGNOSIS — S7002XA Contusion of left hip, initial encounter: Secondary | ICD-10-CM | POA: Diagnosis present

## 2016-02-14 DIAGNOSIS — T148XXA Other injury of unspecified body region, initial encounter: Secondary | ICD-10-CM | POA: Diagnosis not present

## 2016-02-14 DIAGNOSIS — Z79899 Other long term (current) drug therapy: Secondary | ICD-10-CM

## 2016-02-14 DIAGNOSIS — Z888 Allergy status to other drugs, medicaments and biological substances status: Secondary | ICD-10-CM

## 2016-02-14 DIAGNOSIS — N400 Enlarged prostate without lower urinary tract symptoms: Secondary | ICD-10-CM

## 2016-02-14 DIAGNOSIS — Z86718 Personal history of other venous thrombosis and embolism: Secondary | ICD-10-CM

## 2016-02-14 DIAGNOSIS — C3401 Malignant neoplasm of right main bronchus: Secondary | ICD-10-CM | POA: Diagnosis not present

## 2016-02-14 DIAGNOSIS — D72829 Elevated white blood cell count, unspecified: Secondary | ICD-10-CM | POA: Diagnosis present

## 2016-02-14 DIAGNOSIS — F0390 Unspecified dementia without behavioral disturbance: Secondary | ICD-10-CM | POA: Diagnosis present

## 2016-02-14 DIAGNOSIS — Y92230 Patient room in hospital as the place of occurrence of the external cause: Secondary | ICD-10-CM | POA: Diagnosis not present

## 2016-02-14 DIAGNOSIS — J9811 Atelectasis: Secondary | ICD-10-CM | POA: Diagnosis present

## 2016-02-14 DIAGNOSIS — I10 Essential (primary) hypertension: Secondary | ICD-10-CM | POA: Diagnosis not present

## 2016-02-14 DIAGNOSIS — Z950 Presence of cardiac pacemaker: Secondary | ICD-10-CM

## 2016-02-14 DIAGNOSIS — R918 Other nonspecific abnormal finding of lung field: Secondary | ICD-10-CM

## 2016-02-14 DIAGNOSIS — H9190 Unspecified hearing loss, unspecified ear: Secondary | ICD-10-CM | POA: Diagnosis present

## 2016-02-14 DIAGNOSIS — S32502A Unspecified fracture of left pubis, initial encounter for closed fracture: Secondary | ICD-10-CM | POA: Diagnosis not present

## 2016-02-14 DIAGNOSIS — S79912A Unspecified injury of left hip, initial encounter: Secondary | ICD-10-CM | POA: Diagnosis not present

## 2016-02-14 DIAGNOSIS — N179 Acute kidney failure, unspecified: Secondary | ICD-10-CM | POA: Diagnosis present

## 2016-02-14 DIAGNOSIS — R634 Abnormal weight loss: Secondary | ICD-10-CM | POA: Diagnosis not present

## 2016-02-14 DIAGNOSIS — M25559 Pain in unspecified hip: Secondary | ICD-10-CM | POA: Diagnosis not present

## 2016-02-14 DIAGNOSIS — M25552 Pain in left hip: Secondary | ICD-10-CM

## 2016-02-14 DIAGNOSIS — B964 Proteus (mirabilis) (morganii) as the cause of diseases classified elsewhere: Secondary | ICD-10-CM | POA: Diagnosis present

## 2016-02-14 DIAGNOSIS — Z8249 Family history of ischemic heart disease and other diseases of the circulatory system: Secondary | ICD-10-CM

## 2016-02-14 DIAGNOSIS — R05 Cough: Secondary | ICD-10-CM | POA: Diagnosis not present

## 2016-02-14 DIAGNOSIS — R1310 Dysphagia, unspecified: Secondary | ICD-10-CM | POA: Diagnosis not present

## 2016-02-14 DIAGNOSIS — S32592A Other specified fracture of left pubis, initial encounter for closed fracture: Principal | ICD-10-CM | POA: Diagnosis present

## 2016-02-14 DIAGNOSIS — W1830XA Fall on same level, unspecified, initial encounter: Secondary | ICD-10-CM | POA: Diagnosis present

## 2016-02-14 DIAGNOSIS — S299XXA Unspecified injury of thorax, initial encounter: Secondary | ICD-10-CM | POA: Diagnosis not present

## 2016-02-14 DIAGNOSIS — S329XXA Fracture of unspecified parts of lumbosacral spine and pelvis, initial encounter for closed fracture: Secondary | ICD-10-CM | POA: Diagnosis present

## 2016-02-14 DIAGNOSIS — N3000 Acute cystitis without hematuria: Secondary | ICD-10-CM | POA: Diagnosis not present

## 2016-02-14 DIAGNOSIS — Z66 Do not resuscitate: Secondary | ICD-10-CM | POA: Diagnosis present

## 2016-02-14 DIAGNOSIS — T402X5A Adverse effect of other opioids, initial encounter: Secondary | ICD-10-CM | POA: Diagnosis not present

## 2016-02-14 DIAGNOSIS — Z9049 Acquired absence of other specified parts of digestive tract: Secondary | ICD-10-CM

## 2016-02-14 DIAGNOSIS — Y92019 Unspecified place in single-family (private) house as the place of occurrence of the external cause: Secondary | ICD-10-CM

## 2016-02-14 LAB — CBC WITH DIFFERENTIAL/PLATELET
BASOS ABS: 0 10*3/uL (ref 0.0–0.1)
BASOS PCT: 0 %
Band Neutrophils: 0 %
Blasts: 0 %
EOS PCT: 0 %
Eosinophils Absolute: 0 10*3/uL (ref 0.0–0.7)
HCT: 39.5 % (ref 39.0–52.0)
Hemoglobin: 13 g/dL (ref 13.0–17.0)
LYMPHS ABS: 29.8 10*3/uL — AB (ref 0.7–4.0)
Lymphocytes Relative: 81 %
MCH: 31.6 pg (ref 26.0–34.0)
MCHC: 32.9 g/dL (ref 30.0–36.0)
MCV: 95.9 fL (ref 78.0–100.0)
METAMYELOCYTES PCT: 0 %
MONO ABS: 0.4 10*3/uL (ref 0.1–1.0)
Monocytes Relative: 1 %
Myelocytes: 0 %
Neutro Abs: 6.6 10*3/uL (ref 1.7–7.7)
Neutrophils Relative %: 18 %
Other: 0 %
PLATELETS: 276 10*3/uL (ref 150–400)
Promyelocytes Absolute: 0 %
RBC: 4.12 MIL/uL — ABNORMAL LOW (ref 4.22–5.81)
RDW: 15.1 % (ref 11.5–15.5)
WBC: 36.8 10*3/uL — AB (ref 4.0–10.5)
nRBC: 0 /100 WBC

## 2016-02-14 LAB — BASIC METABOLIC PANEL
ANION GAP: 8 (ref 5–15)
BUN: 28 mg/dL — ABNORMAL HIGH (ref 6–20)
CO2: 28 mmol/L (ref 22–32)
Calcium: 8.9 mg/dL (ref 8.9–10.3)
Chloride: 107 mmol/L (ref 101–111)
Creatinine, Ser: 1.26 mg/dL — ABNORMAL HIGH (ref 0.61–1.24)
GFR calc non Af Amer: 47 mL/min — ABNORMAL LOW (ref >60)
GFR, EST AFRICAN AMERICAN: 54 mL/min — AB (ref >60)
Glucose, Bld: 104 mg/dL — ABNORMAL HIGH (ref 65–99)
POTASSIUM: 4.6 mmol/L (ref 3.5–5.1)
SODIUM: 143 mmol/L (ref 135–145)

## 2016-02-14 LAB — URINALYSIS, ROUTINE W REFLEX MICROSCOPIC
Bilirubin Urine: NEGATIVE
Glucose, UA: NEGATIVE mg/dL
Ketones, ur: 5 mg/dL — AB
Nitrite: NEGATIVE
PROTEIN: 30 mg/dL — AB
Specific Gravity, Urine: 1.017 (ref 1.005–1.030)
Squamous Epithelial / LPF: NONE SEEN
pH: 6 (ref 5.0–8.0)

## 2016-02-14 MED ORDER — SODIUM CHLORIDE 0.9 % IV SOLN
Freq: Once | INTRAVENOUS | Status: AC
  Administered 2016-02-14: 21:00:00 via INTRAVENOUS

## 2016-02-14 MED ORDER — SODIUM CHLORIDE 0.9 % IV BOLUS (SEPSIS): 500.0000 mL | Freq: Once | INTRAVENOUS | Status: DC

## 2016-02-14 MED ORDER — FENTANYL CITRATE (PF) 100 MCG/2ML IJ SOLN
12.5000 ug | Freq: Once | INTRAMUSCULAR | Status: DC
  Filled 2016-02-14: qty 2

## 2016-02-14 MED ORDER — DEXTROSE 5 % IV SOLN
1.0000 g | Freq: Once | INTRAVENOUS | Status: AC
  Administered 2016-02-15: 1 g via INTRAVENOUS
  Filled 2016-02-14: qty 10

## 2016-02-14 NOTE — ED Notes (Signed)
Pts HCPOA and Niece, Lovey Newcomer, can be reached at [phone]

## 2016-02-14 NOTE — ED Triage Notes (Signed)
Per [facility] pt from home , fall 3 days ago, refused transport then.  Pt presents with shortening left leg per EMS.  Lives alone, hospice nurse called EMS today because of pain co.  Alert and oriented x 2.

## 2016-02-14 NOTE — ED Notes (Signed)
Pt to xray

## 2016-02-14 NOTE — ED Notes (Signed)
Bed: WA07 Expected date:  Expected time:  Means of arrival:  Comments: EMS- 83yo M, fall/hip pain/Hospice Pt

## 2016-02-14 NOTE — ED Notes (Signed)
IV start attempt

## 2016-02-14 NOTE — Discharge Instructions (Signed)
X-ray show no obvious fracture. Follow-up with your primary care doctor and/or hospice.

## 2016-02-14 NOTE — ED Provider Notes (Signed)
[facility] DEPT Provider Note   CSN: [id number] Arrival date & time: 02/14/16  1719     History   Chief Complaint Chief Complaint  Patient presents with  . Fall  . Hip Pain    left     HPI Seth Hunter is a 83 y.o. male.  Level V caveat for altered mental status/dementia. Most of history obtained from niece. Patient lives at home, and is also under the care of hospice. Patient complains of left hip pain secondary to fall 3 days ago. No cough, chest pain, fever, sweats, chills      Past Medical History:  Diagnosis Date  . Anemia 04/1995   Post op, 2nd to hemorrhage, left leg  . Atrioventricular block, complete ([place])   . BPH (benign prostatic hyperplasia)   . COPD (chronic obstructive pulmonary disease) ([place]) 04/1995   X-ray  . COPD (chronic obstructive pulmonary disease) ([place])   . Dementia    thought this appears to be mild based on reports of family as of 8/11  . DJD (degenerative joint disease) 04/1995   Both shoulders, severe  . DJD (degenerative joint disease), lumbar 04/1995   Spine, severe.   X-ray  . Elevated TSH    Mildly elevated TSH  . GIB (gastrointestinal bleeding)    presumed diverticular bleed, 02/2014 admission  . Hip fracture ([place])   . HOH (hard of hearing)   . Hypertension   . Inguinal hernia   . Pacemaker -MDT   . Pneumonia 04/1995   Bilateral upper lobes  . Syncope    Syncope in the setting of complete heart block status post permanent pacemaker placement on September 27, 2007  . Tremor   . UTI (urinary tract infection) 05/1995  . UTI (urinary tract infection)     Patient Active Problem List   Diagnosis Date Noted  . Leukocytosis 02/14/2016  . UTI (urinary tract infection) 02/14/2016  . Cough 03/18/2015  . DNR (do not resuscitate) 11/11/2014  . Skin lesion 11/11/2014  . Lung mass 08/20/2014  . Bilateral impacted cerumen 03/16/2014  . Abnormal TSH 03/16/2014  . Lower GI bleed 02/27/2014  . Pacemaker 02/27/2014  . Other  dysphagia 09/03/2013  . PNA (pneumonia) 02/13/2013  . Unspecified vitamin D deficiency 02/13/2013  . BPH (benign prostatic hyperplasia) 10/21/2012  . Closed left hip fracture ([place]) 10/20/2012  . Pacemaker -MDT   . Essential hypertension 09/26/2007    Past Surgical History:  Procedure Laterality Date  . CHOLECYSTECTOMY  1988  . CHOLECYSTECTOMY    . 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 CT Angio Chest  09/27/07   No PE, No acute prob  . FEMUR IM NAIL Left 10/21/2012   Procedure: INTRAMEDULLARY (IM) NAIL FEMORAL;  Surgeon: Meredith Pel, MD;  Location: [facility];  Service: Orthopedics;  Laterality: Left;  . FRACTURE SURGERY  2014   L femur  . Fractured left wrist  1990   with old right posterior rib fracture  . Hematoma, left leg  05/1995   Probably 2nd to anticoag  . HERNIA REPAIR  Years ago   Left inguinal  . PACEMAKER INSERTION    . Pacer Placement (Dr. Lovena Le)  09/27/2007  . PULMONARY EMBOLISM SURGERY  04/1995   Post-op  . 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 Syncope Comp Heart Block  9/10- 09/30/2007   HOSP Perm Pacer placed, HTN, dementia, mildly elevated TSH  . VENTRAL HERNIA REPAIR  04/1995   with intra abdominal adhesions, lysis incidental appendectomy       Home Medications    Prior to Admission medications   Medication Sig Start Date End Date Taking? Authorizing Provider  docusate sodium (COLACE) 100 MG capsule Take 100 mg by mouth daily.    Historical Provider, MD  finasteride (PROSCAR) 5 MG tablet TAKE 1 TABLET BY MOUTH DAILY 01/31/16   Tonia Ghent, MD  Garlic 10 MG CAPS Take 1 capsule by mouth daily.    Historical Provider, MD  guaiFENesin (ROBITUSSIN) 100 MG/5ML liquid Take 200 mg by mouth as needed for cough.    Historical Provider, MD  hydrochlorothiazide (MICROZIDE) 12.5 MG capsule Take 1 capsule (12.5 mg total) by mouth daily. 11/10/14   Tonia Ghent, MD  hydrochlorothiazide (MICROZIDE) 12.5 MG  capsule TAKE ONE CAPSULE BY MOUTH DAILY 08/12/15   Tonia Ghent, MD  loratadine (CLARITIN) 10 MG tablet Take 5-10 mg by mouth daily.    Historical Provider, MD  Menthol, Topical Analgesic, (BIOFREEZE) 4 % GEL Apply 1 application topically daily as needed. Applied to knees for arthritis    Historical Provider, MD    Family History Family History  Problem Relation Age of Onset  . Heart disease Father     Hardening of the arteries, heart trouble    Social History Social History  Substance Use Topics  . Smoking status: Never Smoker  . Smokeless tobacco: Never Used  . Alcohol use No     Allergies   Coricidin hbp cough-cold [chlorpheniramine-dm] and Penicillins   Review of Systems Review of Systems  All other systems reviewed and are negative.    Physical Exam Updated Vital Signs BP 118/63 (BP Location: Right Arm)   Pulse 87   Temp 97.7 F (36.5 C) (Oral)   Resp 18   SpO2 99%   Physical Exam  Constitutional:  Pleasant, demented  HENT:  Head: Normocephalic and atraumatic.  Eyes: Conjunctivae are normal.  Neck: Neck supple.  Cardiovascular: Normal rate and regular rhythm.   Pulmonary/Chest: Effort normal and breath sounds normal.  Abdominal: Soft. Bowel sounds are normal.  Musculoskeletal:  Tender left lateral hip  Neurological: He is alert.  Skin: Skin is warm and dry.  Psychiatric:  Unable  Nursing note and vitals reviewed.    ED Treatments / Results  Labs (all labs ordered are listed, but only abnormal results are displayed) Labs Reviewed  CBC WITH DIFFERENTIAL/PLATELET - Abnormal; Notable for the following:       Result Value   WBC 36.8 (*)    RBC 4.12 (*)    Lymphs Abs 29.8 (*)    All other components within normal limits  BASIC METABOLIC PANEL - Abnormal; Notable for the following:    Glucose, Bld 104 (*)    BUN 28 (*)    Creatinine, Ser 1.26 (*)    GFR calc non Af Amer 47 (*)    GFR calc Af Amer 54 (*)    All other components within normal  limits  URINALYSIS, ROUTINE W REFLEX MICROSCOPIC - Abnormal; Notable for the following:    APPearance HAZY (*)    Hgb urine dipstick MODERATE (*)    Ketones, ur 5 (*)    Protein, ur 30 (*)    Leukocytes, UA LARGE (*)    Bacteria, UA MANY (*)    All other components within normal limits  URINE CULTURE  PATHOLOGIST SMEAR REVIEW    EKG  EKG Interpretation None  Radiology Dg Chest 2 View  Result Date: 02/14/2016 CLINICAL DATA:  Weakness.  Fall today. EXAM: CHEST  2 VIEW COMPARISON:  01/07/2013 FINDINGS: Normal heart size. Double lead left subclavian pacemaker device and leads are stable and intact. Tips are in the right atrium and right ventricle. No pneumothorax or pleural effusion. Lungs are very under aerated and there is scattered atelectasis. IMPRESSION: Low volumes and scattered atelectasis. Electronically Signed   By: Marybelle Killings M.D.   On: 02/14/2016 19:50   Dg Hip Unilat With Pelvis 2-3 Views Left  Result Date: 02/14/2016 CLINICAL DATA:  Fall EXAM: DG HIP (WITH OR WITHOUT PELVIS) 2-3V LEFT COMPARISON:  None. FINDINGS: Dynamic compression screw and rod are present in the proximal left femur. No acute fracture. No dislocation. Osteopenia. Degenerative changes in the lower lumbar spine. No breakage or loosening of the hardware. IMPRESSION: No acute bony pathology. Chronic and postoperative changes are noted. Electronically Signed   By: Marybelle Killings M.D.   On: 02/14/2016 18:13    Procedures Procedures (including critical care time)  Medications Ordered in ED Medications  fentaNYL (SUBLIMAZE) injection 12.5 mcg (12.5 mcg Intravenous Not Given 02/14/16 1920)  0.9 %  sodium chloride infusion ( Intravenous New Bag/Given 02/14/16 2047)     Initial Impression / Assessment and Plan / ED Course  I have reviewed the triage vital signs and the nursing notes.  Pertinent labs & imaging results that were available during my care of the patient were reviewed by me and considered in  my medical decision making (see chart for details).     Plain films of left hip show no fracture. Urinalysis shows evidence of infection. Will Rx IV fluids, IV Rocephin, urine culture, CT left hip, admit.  Final Clinical Impressions(s) / ED Diagnoses   Final diagnoses:  Fall, initial encounter  Left hip pain  Urinary tract infection without hematuria, site unspecified    New Prescriptions New Prescriptions   No medications on file     Nat Christen, MD 02/14/16 2318

## 2016-02-14 NOTE — Telephone Encounter (Signed)
Agree. Thanks. Will await ER note.

## 2016-02-14 NOTE — Telephone Encounter (Signed)
Robin Nurse with [facility] is at the home of pt now; pt fell on 02/11/16 and EMS was called but pt refused to go. Shirlean Mylar said pt has been in bed since 02/11/16, cannot walk on lt leg due to pain and cannot raise lt leg while in bed. Shirlean Mylar wanted to verify if OK for pt to go to ED now; pt is willing to go now. Dr Damita Dunnings advised yes appropriate for pt to go to Ed for eval. Shirlean Mylar will arrange ambulance and will call niece to meet them at the hospital. FYI to Dr Damita Dunnings.

## 2016-02-14 NOTE — H&P (Signed)
Seth Hunter OVZ:[id number] DOB: July 22, 1918 DOA: 02/14/2016     PCP: Elsie Stain, MD   Outpatient Specialists: Annamaria Boots  Oncology Dr. Julien Nordmann, orthopedics Dr.Dean [facility] orthopedics Patient coming from:    home Lives alone,   Hospice comes out daily    Chief Complaint: Left leg pain  HPI: Seth Hunter is a 83 y.o. male with medical history significant of hypertension, abnormal TSH, status post pacemaker, history of lower GI bleed likely diverticular hemorrhage, COPD, HTN, BPH    Presented with unwitnessed fall. Family thinks it was due to walker dysfunction.  Patient has fallen on January 26 at that time EMS was called but he refused to go to the hospital he has been able to walk initially. Since that time patient was bed bound and cannot walk secondary to inability to tolerate any weightbearing. Patient is on hospice care but given inability to ambulate wish to be evaluated in emergency department EMS was called and he was brought into ER.Family denies fever, but has had some confusion reports groin pain.     Regarding pertinent Chronic problems: She have had diverticular hemorrhage in a setting of aspirin requiring 2 units of blood   in 2016 Patient has long-standing history of elevated white blood cell count that has gradually been increasing. Patient has hx of DVT and had CT chest in 2017 showing possible lung mass family at that time chose not to proseu that.   IN ER:  Temp (24hrs), Avg:97.7 F (36.5 C), Min:97.6 F (36.4 C), Max:97.7 F (36.5 C)    RR 18 99% RA Hr 87 BP 118/63 WBC 36.8 absolute lymphocytosis and lymphocyte count of 29.8 absolute neutrophil count 6.6  BUN 28 creatinine 1.26 which is slightly above baseline of 1.0  Chest x-ray low volumes and scattered atelectasis  Left Hip no fracture Following Medications were ordered in ER: Medications  fentaNYL (SUBLIMAZE) injection 12.5 mcg (12.5 mcg Intravenous Not Given 02/14/16 1920)  0.9 %  sodium  chloride infusion ( Intravenous New Bag/Given 02/14/16 2047)      Hospitalist was called for admission for UTI and lymphocytosis after unwitnessed fall resulting in significant left hip pain  Review of Systems:    Pertinent positives include: confusion, hip pain  Constitutional:  No weight loss, night sweats, Fevers, chills, fatigue, weight loss  HEENT:  No headaches, Difficulty swallowing,Tooth/dental problems,Sore throat,  No sneezing, itching, ear ache, nasal congestion, post nasal drip,  Cardio-vascular:  No chest pain, Orthopnea, PND, anasarca, dizziness, palpitations.no Bilateral lower extremity swelling  GI:  No heartburn, indigestion, abdominal pain, nausea, vomiting, diarrhea, change in bowel habits, loss of appetite, melena, blood in stool, hematemesis Resp:  no shortness of breath at rest. No dyspnea on exertion, No excess mucus, no productive cough, No non-productive cough, No coughing up of blood.No change in color of mucus.No wheezing. Skin:  no rash or lesions. No jaundice GU:  no dysuria, change in color of urine, no urgency or frequency. No straining to urinate.  No flank pain.  Musculoskeletal:  No joint pain or no joint swelling. No decreased range of motion. No back pain.  Psych:  No change in mood or affect. No depression or anxiety. No memory loss.  Neuro: no localizing neurological complaints, no tingling, no weakness, no double vision, no gait abnormality, no slurred speech,    As per HPI otherwise 10 point review of systems negative.   Past Medical History: Past Medical History:  Diagnosis Date  . Anemia  04/1995   Post op, 2nd to hemorrhage, left leg  . Atrioventricular block, complete ([place])   . BPH (benign prostatic hyperplasia)   . COPD (chronic obstructive pulmonary disease) ([place]) 04/1995   X-ray  . COPD (chronic obstructive pulmonary disease) ([place])   . Dementia    thought this appears to be mild based on reports of family as of 8/11  . DJD  (degenerative joint disease) 04/1995   Both shoulders, severe  . DJD (degenerative joint disease), lumbar 04/1995   Spine, severe.   X-ray  . Elevated TSH    Mildly elevated TSH  . GIB (gastrointestinal bleeding)    presumed diverticular bleed, 02/2014 admission  . Hip fracture ([place])   . HOH (hard of hearing)   . Hypertension   . Inguinal hernia   . Pacemaker -MDT   . Pneumonia 04/1995   Bilateral upper lobes  . Syncope    Syncope in the setting of complete heart block status post permanent pacemaker placement on September 27, 2007  . Tremor   . UTI (urinary tract infection) 05/1995  . UTI (urinary tract infection)    Past Surgical History:  Procedure Laterality Date  . CHOLECYSTECTOMY  1988  . CHOLECYSTECTOMY    . 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 CT Angio Chest  09/27/07   No PE, No acute prob  . FEMUR IM NAIL Left 10/21/2012   Procedure: INTRAMEDULLARY (IM) NAIL FEMORAL;  Surgeon: Meredith Pel, MD;  Location: [facility];  Service: Orthopedics;  Laterality: Left;  . FRACTURE SURGERY  2014   L femur  . Fractured left wrist  1990   with old right posterior rib fracture  . Hematoma, left leg  05/1995   Probably 2nd to anticoag  . HERNIA REPAIR  Years ago   Left inguinal  . PACEMAKER INSERTION    . Pacer Placement (Dr. Lovena Le)  09/27/2007  . PULMONARY EMBOLISM SURGERY  04/1995   Post-op  . 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 Syncope Comp Heart Block  9/10- 09/30/2007   HOSP Perm Pacer placed, HTN, dementia, mildly elevated TSH  . VENTRAL HERNIA REPAIR  04/1995   with intra abdominal adhesions, lysis incidental appendectomy     Social History:  Ambulatory  With walker at baseline    reports that he has never smoked. He has never used smokeless tobacco. He reports that he does not drink alcohol or use drugs.  Allergies:   Allergies  Allergen Reactions  . Coricidin Hbp Cough-Cold [Chlorpheniramine-Dm]  Other (See Comments)    Sedation.   Marland Kitchen Penicillins Other (See Comments)    Childhood allergy       Family History:   Family History  Problem Relation Age of Onset  . Heart disease Father     Hardening of the arteries, heart trouble    Medications: Prior to Admission medications   Medication Sig Start Date End Date Taking? Authorizing Provider  docusate sodium (COLACE) 100 MG capsule Take 100 mg by mouth daily.    Historical Provider, MD  finasteride (PROSCAR) 5 MG tablet TAKE 1 TABLET BY MOUTH DAILY 01/31/16   Tonia Ghent, MD  Garlic 10 MG CAPS Take 1 capsule by mouth daily.    Historical Provider, MD  guaiFENesin (ROBITUSSIN) 100 MG/5ML liquid Take 200 mg by mouth as needed for cough.    Historical Provider, MD  hydrochlorothiazide (MICROZIDE) 12.5 MG capsule Take 1 capsule (  12.5 mg total) by mouth daily. 11/10/14   Tonia Ghent, MD  hydrochlorothiazide (MICROZIDE) 12.5 MG capsule TAKE ONE CAPSULE BY MOUTH DAILY 08/12/15   Tonia Ghent, MD  loratadine (CLARITIN) 10 MG tablet Take 5-10 mg by mouth daily.    Historical Provider, MD  Menthol, Topical Analgesic, (BIOFREEZE) 4 % GEL Apply 1 application topically daily as needed. Applied to knees for arthritis    Historical Provider, MD    Physical Exam: Patient Vitals for the past 24 hrs:  BP Temp Temp src Pulse Resp SpO2  02/14/16 2136 118/63 - - 87 18 99 %  02/14/16 1944 137/75 97.7 F (36.5 C) Oral 86 19 98 %  02/14/16 1732 109/74 97.6 F (36.4 C) Oral 81 18 95 %  02/14/16 1727 - - - - - 97 %    1. General:  in No Acute distress 2. Psychological: Alert but not Oriented 3. Head/ENT:   Dry Mucous Membranes                          Head Non traumatic, neck supple                          Normal  Dentition 4. SKIN: decreased Skin turgor,  Skin clean Dry and intact no rash 5. Heart: Regular rate and rhythm systolic Murmur, Rub or gallop 6. Lungs:  no wheezes or crackles   7. Abdomen: Soft, non-tender, Non  distended 8. Lower extremities: no clubbing, cyanosis, or edema, left leg shortned 9. Neurologically strength 5 out of 5 in all 4 extremities cranial nerves II through XII intact 10. MSK: Normal range of motion   body mass index is unknown because there is no height or weight on file.  Labs on Admission:   Labs on Admission: I have personally reviewed following labs and imaging studies  CBC:  Recent Labs Lab 02/14/16 1753  WBC 36.8*  NEUTROABS 6.6  HGB 13.0  HCT 39.5  MCV 95.9  PLT 825   Basic Metabolic Panel:  Recent Labs Lab 02/14/16 1753  NA 143  K 4.6  CL 107  CO2 28  GLUCOSE 104*  BUN 28*  CREATININE 1.26*  CALCIUM 8.9   GFR: CrCl cannot be calculated (Unknown ideal weight.). Liver Function Tests: No results for input(s): AST, ALT, ALKPHOS, BILITOT, PROT, ALBUMIN in the last 168 hours. No results for input(s): LIPASE, AMYLASE in the last 168 hours. No results for input(s): AMMONIA in the last 168 hours. Coagulation Profile: No results for input(s): INR, PROTIME in the last 168 hours. Cardiac Enzymes: No results for input(s): CKTOTAL, CKMB, CKMBINDEX, TROPONINI in the last 168 hours. BNP (last 3 results) No results for input(s): PROBNP in the last 8760 hours. HbA1C: No results for input(s): HGBA1C in the last 72 hours. CBG: No results for input(s): GLUCAP in the last 168 hours. Lipid Profile: No results for input(s): CHOL, HDL, LDLCALC, TRIG, CHOLHDL, LDLDIRECT in the last 72 hours. Thyroid Function Tests: No results for input(s): TSH, T4TOTAL, FREET4, T3FREE, THYROIDAB in the last 72 hours. Anemia Panel: No results for input(s): VITAMINB12, FOLATE, FERRITIN, TIBC, IRON, RETICCTPCT in the last 72 hours. Urine analysis:    Component Value Date/Time   COLORURINE YELLOW 02/14/2016 2050   APPEARANCEUR HAZY (A) 02/14/2016 2050   LABSPEC 1.017 02/14/2016 2050   PHURINE 6.0 02/14/2016 2050   GLUCOSEU NEGATIVE 02/14/2016 2050   HGBUR MODERATE (A)  02/14/2016 2050   HGBUR small 07/30/2006 [place] 02/14/2016 2050   KETONESUR 5 (A) 02/14/2016 2050   PROTEINUR 30 (A) 02/14/2016 2050   UROBILINOGEN 0.2 06/01/2013 0349   NITRITE NEGATIVE 02/14/2016 2050   LEUKOCYTESUR LARGE (A) 02/14/2016 2050   Sepsis Labs: '@LABRCNTIP'$ (procalcitonin:4,lacticidven:4) )No results found for this or any previous visit (from the past 240 hour(s)).    UA  evidence of UTI   Lab Results  Component Value Date   HGBA1C  09/27/2007    5.7 (NOTE)   The ADA recommends the following therapeutic goal for glycemic   control related to Hgb A1C measurement:   Goal of Therapy:   < 7.0% Hgb A1C   Reference: American Diabetes Association: Clinical Practice   Recommendations 2008, Diabetes Care,  2008, 31:(Suppl 1).    CrCl cannot be calculated (Unknown ideal weight.).  BNP (last 3 results) No results for input(s): PROBNP in the last 8760 hours.   ECG REPORT Not obtained   There were no vitals filed for this visit.   Cultures:    Component Value Date/Time   SDES URINE, CATHETERIZED 01/07/2013 0332   SPECREQUEST ADDED 0420 01/07/2013 0332   CULT  01/07/2013 0332    ENTEROCOCCUS SPECIES Performed at [facility] 01/09/2013 FINAL 01/07/2013 0332     Radiological Exams on Admission: Dg Chest 2 View  Result Date: 02/14/2016 CLINICAL DATA:  Weakness.  Fall today. EXAM: CHEST  2 VIEW COMPARISON:  01/07/2013 FINDINGS: Normal heart size. Double lead left subclavian pacemaker device and leads are stable and intact. Tips are in the right atrium and right ventricle. No pneumothorax or pleural effusion. Lungs are very under aerated and there is scattered atelectasis. IMPRESSION: Low volumes and scattered atelectasis. Electronically Signed   By: Marybelle Killings M.D.   On: 02/14/2016 19:50   Dg Hip Unilat With Pelvis 2-3 Views Left  Result Date: 02/14/2016 CLINICAL DATA:  Fall EXAM: DG HIP (WITH OR WITHOUT PELVIS) 2-3V LEFT  COMPARISON:  None. FINDINGS: Dynamic compression screw and rod are present in the proximal left femur. No acute fracture. No dislocation. Osteopenia. Degenerative changes in the lower lumbar spine. No breakage or loosening of the hardware. IMPRESSION: No acute bony pathology. Chronic and postoperative changes are noted. Electronically Signed   By: Marybelle Killings M.D.   On: 02/14/2016 18:13    Chart has been reviewed    Assessment/Plan  84 y.o. male with medical history significant of hypertension, abnormal TSH, status post pacemaker, history of lower GI bleed likely diverticular hemorrhage, COPD, HTN, BPH admitted for fall resulting in inability to [facility]  Due to severe hip pain, plain imaging negative for fracture noted to have  possible UTI, noted to have severe lymphocytosis  Present on Admission: Left hip pain. Patient having trouble with ambulation secondary to discomfort. Given presence of pacemaker MRI contraindicated will obtain CT to evaluate left hip for occult fractures. Discussed with family they would like to have conservative approach that if it is any evidence of hip fracture and is a chance of repair to regain mobility they would be interested in that. Acute encephalopathy in a setting of UTI and fall will obtain CT of the head to rule out any intracranial abnormality patient unable to provide history regarding how he fell avoid over aggressive interventions as per request of family . BPH (benign prostatic hyperplasia) stable continue home medications . Essential hypertension stable continue home medications . Leukocytosis lymphocyte predominant suspect hematological  malignancy such as CLL discussed with family who at this point not interested in aggressive interventions or studies such as bone marrow biopsies. Patient has history of lung mass which he chose not to pursue last year. History of lung mass family does not wish this to be father investigated avoid over aggressive  interventions or studies . UTI (urinary tract infection) Treat with Rocephin await results of urine culture  Other plan as per orders.  DVT prophylaxis:  SCD  Code Status:  DNR/DNI as per family patient on home hospice hospice will need to be notify the patient has been admitted  Family Communication:   Family not at  Bedside  plan of care was discussed with niece  Disposition Plan:   likely will need placement for rehabilitation                                                Would benefit from PT/OT eval prior to DC ordered                                              Consults called: none   Admission status: inpatient   Level of care    medical floor        I have spent a total of 56 min on this admission     Edson Deridder 02/14/2016, 11:32 PM    [facility]  Pager [phone]   after 2 AM please page floor coverage PA If 7AM-7PM, please contact the day team taking care of the patient  [email or web address]  Password [email or web address]

## 2016-02-14 NOTE — ED Notes (Signed)
RN called [facility] to confirm Pts Tx wishes at [phone].  Pt did not arrive at [facility] with a Most or a DNR form.  They will call back.

## 2016-02-15 ENCOUNTER — Telehealth: Payer: Self-pay | Admitting: Family Medicine

## 2016-02-15 DIAGNOSIS — I10 Essential (primary) hypertension: Secondary | ICD-10-CM | POA: Diagnosis not present

## 2016-02-15 DIAGNOSIS — Y92230 Patient room in hospital as the place of occurrence of the external cause: Secondary | ICD-10-CM | POA: Diagnosis not present

## 2016-02-15 DIAGNOSIS — Z8249 Family history of ischemic heart disease and other diseases of the circulatory system: Secondary | ICD-10-CM | POA: Diagnosis not present

## 2016-02-15 DIAGNOSIS — F0280 Dementia in other diseases classified elsewhere without behavioral disturbance: Secondary | ICD-10-CM | POA: Diagnosis not present

## 2016-02-15 DIAGNOSIS — R1310 Dysphagia, unspecified: Secondary | ICD-10-CM | POA: Diagnosis not present

## 2016-02-15 DIAGNOSIS — N3000 Acute cystitis without hematuria: Secondary | ICD-10-CM | POA: Diagnosis not present

## 2016-02-15 DIAGNOSIS — G9340 Encephalopathy, unspecified: Secondary | ICD-10-CM | POA: Diagnosis present

## 2016-02-15 DIAGNOSIS — S32512D Fracture of superior rim of left pubis, subsequent encounter for fracture with routine healing: Secondary | ICD-10-CM | POA: Diagnosis not present

## 2016-02-15 DIAGNOSIS — W1830XA Fall on same level, unspecified, initial encounter: Secondary | ICD-10-CM | POA: Diagnosis present

## 2016-02-15 DIAGNOSIS — J449 Chronic obstructive pulmonary disease, unspecified: Secondary | ICD-10-CM | POA: Diagnosis not present

## 2016-02-15 DIAGNOSIS — R488 Other symbolic dysfunctions: Secondary | ICD-10-CM | POA: Diagnosis not present

## 2016-02-15 DIAGNOSIS — Z880 Allergy status to penicillin: Secondary | ICD-10-CM | POA: Diagnosis not present

## 2016-02-15 DIAGNOSIS — Z86718 Personal history of other venous thrombosis and embolism: Secondary | ICD-10-CM | POA: Diagnosis not present

## 2016-02-15 DIAGNOSIS — N390 Urinary tract infection, site not specified: Secondary | ICD-10-CM | POA: Diagnosis not present

## 2016-02-15 DIAGNOSIS — H9190 Unspecified hearing loss, unspecified ear: Secondary | ICD-10-CM | POA: Diagnosis present

## 2016-02-15 DIAGNOSIS — M25552 Pain in left hip: Secondary | ICD-10-CM | POA: Diagnosis present

## 2016-02-15 DIAGNOSIS — S32592A Other specified fracture of left pubis, initial encounter for closed fracture: Secondary | ICD-10-CM | POA: Diagnosis present

## 2016-02-15 DIAGNOSIS — B964 Proteus (mirabilis) (morganii) as the cause of diseases classified elsewhere: Secondary | ICD-10-CM | POA: Diagnosis not present

## 2016-02-15 DIAGNOSIS — J9811 Atelectasis: Secondary | ICD-10-CM | POA: Diagnosis present

## 2016-02-15 DIAGNOSIS — M170 Bilateral primary osteoarthritis of knee: Secondary | ICD-10-CM | POA: Diagnosis not present

## 2016-02-15 DIAGNOSIS — D72820 Lymphocytosis (symptomatic): Secondary | ICD-10-CM | POA: Diagnosis not present

## 2016-02-15 DIAGNOSIS — C3401 Malignant neoplasm of right main bronchus: Secondary | ICD-10-CM | POA: Diagnosis not present

## 2016-02-15 DIAGNOSIS — Z66 Do not resuscitate: Secondary | ICD-10-CM | POA: Diagnosis present

## 2016-02-15 DIAGNOSIS — Z888 Allergy status to other drugs, medicaments and biological substances status: Secondary | ICD-10-CM | POA: Diagnosis not present

## 2016-02-15 DIAGNOSIS — M6281 Muscle weakness (generalized): Secondary | ICD-10-CM | POA: Diagnosis not present

## 2016-02-15 DIAGNOSIS — S7002XD Contusion of left hip, subsequent encounter: Secondary | ICD-10-CM | POA: Diagnosis not present

## 2016-02-15 DIAGNOSIS — N401 Enlarged prostate with lower urinary tract symptoms: Secondary | ICD-10-CM | POA: Diagnosis not present

## 2016-02-15 DIAGNOSIS — I4430 Unspecified atrioventricular block: Secondary | ICD-10-CM | POA: Diagnosis not present

## 2016-02-15 DIAGNOSIS — S7002XA Contusion of left hip, initial encounter: Secondary | ICD-10-CM | POA: Diagnosis present

## 2016-02-15 DIAGNOSIS — W19XXXA Unspecified fall, initial encounter: Secondary | ICD-10-CM

## 2016-02-15 DIAGNOSIS — M259 Joint disorder, unspecified: Secondary | ICD-10-CM | POA: Diagnosis not present

## 2016-02-15 DIAGNOSIS — N179 Acute kidney failure, unspecified: Secondary | ICD-10-CM | POA: Diagnosis present

## 2016-02-15 DIAGNOSIS — R946 Abnormal results of thyroid function studies: Secondary | ICD-10-CM | POA: Diagnosis present

## 2016-02-15 DIAGNOSIS — F0390 Unspecified dementia without behavioral disturbance: Secondary | ICD-10-CM | POA: Diagnosis present

## 2016-02-15 DIAGNOSIS — R634 Abnormal weight loss: Secondary | ICD-10-CM | POA: Diagnosis not present

## 2016-02-15 DIAGNOSIS — Y92019 Unspecified place in single-family (private) house as the place of occurrence of the external cause: Secondary | ICD-10-CM | POA: Diagnosis not present

## 2016-02-15 DIAGNOSIS — Z950 Presence of cardiac pacemaker: Secondary | ICD-10-CM | POA: Diagnosis not present

## 2016-02-15 DIAGNOSIS — T402X5A Adverse effect of other opioids, initial encounter: Secondary | ICD-10-CM | POA: Diagnosis not present

## 2016-02-15 DIAGNOSIS — S32592D Other specified fracture of left pubis, subsequent encounter for fracture with routine healing: Secondary | ICD-10-CM | POA: Diagnosis not present

## 2016-02-15 DIAGNOSIS — Z79899 Other long term (current) drug therapy: Secondary | ICD-10-CM | POA: Diagnosis not present

## 2016-02-15 DIAGNOSIS — R1312 Dysphagia, oropharyngeal phase: Secondary | ICD-10-CM | POA: Diagnosis not present

## 2016-02-15 DIAGNOSIS — G250 Essential tremor: Secondary | ICD-10-CM | POA: Diagnosis not present

## 2016-02-15 DIAGNOSIS — K9181 Other intraoperative complications of digestive system: Secondary | ICD-10-CM | POA: Diagnosis not present

## 2016-02-15 DIAGNOSIS — D72829 Elevated white blood cell count, unspecified: Secondary | ICD-10-CM | POA: Diagnosis not present

## 2016-02-15 DIAGNOSIS — S329XXA Fracture of unspecified parts of lumbosacral spine and pelvis, initial encounter for closed fracture: Secondary | ICD-10-CM | POA: Diagnosis present

## 2016-02-15 DIAGNOSIS — Z9049 Acquired absence of other specified parts of digestive tract: Secondary | ICD-10-CM | POA: Diagnosis not present

## 2016-02-15 DIAGNOSIS — R05 Cough: Secondary | ICD-10-CM | POA: Diagnosis not present

## 2016-02-15 DIAGNOSIS — R918 Other nonspecific abnormal finding of lung field: Secondary | ICD-10-CM | POA: Diagnosis not present

## 2016-02-15 DIAGNOSIS — N400 Enlarged prostate without lower urinary tract symptoms: Secondary | ICD-10-CM | POA: Diagnosis present

## 2016-02-15 DIAGNOSIS — K922 Gastrointestinal hemorrhage, unspecified: Secondary | ICD-10-CM | POA: Diagnosis not present

## 2016-02-15 LAB — BASIC METABOLIC PANEL
ANION GAP: 8 (ref 5–15)
BUN: 23 mg/dL — AB (ref 6–20)
CO2: 24 mmol/L (ref 22–32)
Calcium: 8.2 mg/dL — ABNORMAL LOW (ref 8.9–10.3)
Chloride: 109 mmol/L (ref 101–111)
Creatinine, Ser: 0.97 mg/dL (ref 0.61–1.24)
GFR calc Af Amer: >60 mL/min (ref >60)
GFR calc non Af Amer: >60 mL/min (ref >60)
Glucose, Bld: 101 mg/dL — ABNORMAL HIGH (ref 65–99)
POTASSIUM: 3.6 mmol/L (ref 3.5–5.1)
Sodium: 141 mmol/L (ref 135–145)

## 2016-02-15 LAB — CBC
HEMATOCRIT: 36.1 % — AB (ref 39.0–52.0)
Hemoglobin: 12.1 g/dL — ABNORMAL LOW (ref 13.0–17.0)
MCH: 31.8 pg (ref 26.0–34.0)
MCHC: 33.5 g/dL (ref 30.0–36.0)
MCV: 95 fL (ref 78.0–100.0)
Platelets: 271 10*3/uL (ref 150–400)
RBC: 3.8 MIL/uL — AB (ref 4.22–5.81)
RDW: 14.9 % (ref 11.5–15.5)
WBC: 32 10*3/uL — AB (ref 4.0–10.5)

## 2016-02-15 LAB — PATHOLOGIST SMEAR REVIEW

## 2016-02-15 LAB — ALBUMIN: Albumin: 2.7 g/dL — ABNORMAL LOW (ref 3.5–5.0)

## 2016-02-15 MED ORDER — METHOCARBAMOL 1000 MG/10ML IJ SOLN: 500.0000 mg | Freq: Four times a day (QID) | INTRAVENOUS | Status: DC | PRN

## 2016-02-15 MED ORDER — BISACODYL 10 MG RE SUPP
10.0000 mg | Freq: Every day | RECTAL | Status: DC | PRN
  Administered 2016-02-18: 10 mg via RECTAL
  Filled 2016-02-15: qty 1

## 2016-02-15 MED ORDER — HYDROCODONE-ACETAMINOPHEN 5-325 MG PO TABS
1.0000 | ORAL_TABLET | Freq: Four times a day (QID) | ORAL | Status: DC | PRN
  Administered 2016-02-15: 2 via ORAL
  Administered 2016-02-15 (×2): 1 via ORAL
  Filled 2016-02-15: qty 2
  Filled 2016-02-15 (×2): qty 1

## 2016-02-15 MED ORDER — ENSURE ENLIVE PO LIQD
237.0000 mL | Freq: Two times a day (BID) | ORAL | Status: DC
  Administered 2016-02-15 – 2016-02-18 (×4): 237 mL via ORAL

## 2016-02-15 MED ORDER — SENNA 8.6 MG PO TABS
1.0000 | ORAL_TABLET | Freq: Two times a day (BID) | ORAL | Status: DC
  Administered 2016-02-15 – 2016-02-18 (×7): 8.6 mg via ORAL
  Filled 2016-02-15 (×7): qty 1

## 2016-02-15 MED ORDER — METHOCARBAMOL 500 MG PO TABS
500.0000 mg | ORAL_TABLET | Freq: Four times a day (QID) | ORAL | Status: DC | PRN
  Administered 2016-02-15: 500 mg via ORAL
  Filled 2016-02-15: qty 1

## 2016-02-15 MED ORDER — MORPHINE SULFATE (PF) 2 MG/ML IV SOLN: 2.0000 mg | INTRAVENOUS | Status: DC | PRN

## 2016-02-15 MED ORDER — LORATADINE 10 MG PO TABS
5.0000 mg | ORAL_TABLET | Freq: Every day | ORAL | Status: DC
  Administered 2016-02-15: 5 mg via ORAL
  Administered 2016-02-16 – 2016-02-18 (×3): 10 mg via ORAL
  Filled 2016-02-15 (×4): qty 1

## 2016-02-15 MED ORDER — POLYETHYLENE GLYCOL 3350 17 G PO PACK: 17.0000 g | PACK | Freq: Every day | ORAL | Status: DC | PRN

## 2016-02-15 MED ORDER — DOCUSATE SODIUM 100 MG PO CAPS
100.0000 mg | ORAL_CAPSULE | Freq: Every day | ORAL | Status: DC
  Administered 2016-02-15 – 2016-02-18 (×4): 100 mg via ORAL
  Filled 2016-02-15 (×4): qty 1

## 2016-02-15 MED ORDER — SODIUM CHLORIDE 0.9 % IV SOLN
INTRAVENOUS | Status: DC
  Administered 2016-02-15 – 2016-02-16 (×2): via INTRAVENOUS

## 2016-02-15 MED ORDER — DEXTROSE 5 % IV SOLN
1.0000 g | INTRAVENOUS | Status: DC
Start: 2016-02-15 — End: 2016-02-17
  Administered 2016-02-15 – 2016-02-16 (×2): 1 g via INTRAVENOUS
  Filled 2016-02-15 (×3): qty 10

## 2016-02-15 MED ORDER — FINASTERIDE 5 MG PO TABS
5.0000 mg | ORAL_TABLET | Freq: Every day | ORAL | Status: DC
  Administered 2016-02-15 – 2016-02-18 (×4): 5 mg via ORAL
  Filled 2016-02-15 (×4): qty 1

## 2016-02-15 MED ORDER — DEXTROSE 5 % IV SOLN
500.0000 mg | Freq: Four times a day (QID) | INTRAVENOUS | Status: DC | PRN
  Administered 2016-02-15: 500 mg via INTRAVENOUS
  Filled 2016-02-15 (×2): qty 5
  Filled 2016-02-15: qty 550

## 2016-02-15 MED ORDER — SODIUM CHLORIDE 0.9 % IV SOLN
INTRAVENOUS | Status: AC
  Administered 2016-02-15: 02:00:00 via INTRAVENOUS

## 2016-02-15 NOTE — Progress Notes (Signed)
Initial Nutrition Assessment  INTERVENTION:   -Provide Ensure Enlive po BID, each supplement provides 350 kcal and 20 grams of protein -Recommend staff assist with meal set-up as pt with muscle tremors. -Encourage PO intake -RD to continue to monitor  NUTRITION DIAGNOSIS:   Increased nutrient needs related to other (see comment) (pelvic fracture) as evidenced by estimated needs.  GOAL:   Patient will meet greater than or equal to 90% of their needs  MONITOR:   PO intake, Supplement acceptance, Weight trends, Labs, I & O's  REASON FOR ASSESSMENT:   Low Braden    ASSESSMENT:    83 y.o. male with medical history significant of hypertension, abnormal TSH, status post pacemaker, history of lower GI bleed likely diverticular hemorrhage, COPD, HTN, BPH  Patient in room with no family at bedside. Pt is HOH with dementia. Pt was unable to provide history. Pt was eating some breakfast during visit with noticeable hand tremors. These tremors made it difficult for the patient to drink his beverages. Pt was able to eat his eggs with no issue. RD and dietetic intern provided pt with lids and straws for patient's beverages. Pt with Ensure supplement on tray, which was 1/2 full. RD will order Ensure supplements.  Per chart review, pt has lost 13 lb since 10/25 (7% wt loss x 3 months, insignificant for time frame). Partial NFPE performed d/t patient eating. NFPE shows no sign of depletion of muscle mass or body fat.  Labs reviewed. Medications: colace capsule daily, Senokot tablet BID  Diet Order:  Diet regular Room service appropriate? Yes; Fluid consistency: Thin  Skin:  Reviewed, no issues  Last BM:  PTA  Height:   Ht Readings from Last 1 Encounters:  09/09/14 '6\' 1"'$  (1.854 m)    Weight:   Wt Readings from Last 1 Encounters:  02/15/16 172 lb 3.2 oz (78.1 kg)    Ideal Body Weight:  83.6 kg (based on height from 2016)  BMI:  Body mass index is 22.72 kg/m.  Estimated  Nutritional Needs:   Kcal:  1500-1700  Protein:  85-95g  Fluid:  1.7L/day  EDUCATION NEEDS:   No education needs identified at this time  Clayton Bibles, MS, RD, LDN Pager: [phone] After Hours Pager: [phone]

## 2016-02-15 NOTE — Progress Notes (Signed)
[facility] 1530 -  [facility] - GIP RNvisit @ 3:00pm  This is a covered and related GIP admission from 02/15/16 with an [facility] diagnosis of Carcinoma of Right Lung by Dr. Karie Georges. Code status is DNR. Patient's PCG called [facility] in regards to fall from Friday.  Patient was still having pain and problems with ambulation after fall.  Sandi, PCG, initiated EMS to come pick up patient and transport to hospital for further evaluation of hip pain.   Visited with patient in his room today, no one else was with patient at this time.  Patient is alert and pleasantly disoriented.  Patient was oriented to self only.  Patient states "I'm in the [place]".  Patient states he wasn't having any pain, and didn't appear to be in any distress.  Per RN, patient had complained of some pain earlier and was given pain medicine.  She further advised that patient was somewhat oriented yesterday evening, but has not been today.  Patient does have confirmed pelvic fracture and family is considering SNF placement.  Unsure if Rehab is viable option per RN.   Patient is receiving HYDROcodone-acetaminophen (NORCO/VICODIN) 5-325 MG per tablet 1-2 tablet, Dose: 1-2 tablet, Freq: Every 6 hours PRN,  Route: PO for moderate pain.    Notified Dr. Karie Georges of admission.  Left message with office staff to advise Dr. Damita Dunnings, AOR,  of same.  Transfer summary and medication list placed on shadow chart.   Thank you,  Edyth Gunnels, RN, [place] [facility] Liaison [phone]  All hospital liaison's are now on [place].  Please feel free to call me at the above number or call hospice at [phone] after 5pm.

## 2016-02-15 NOTE — Telephone Encounter (Signed)
Called to advice that pt has been admitted to [facility] long [facility] in rm  1530  She can be reached back at  [phone] if there are any questions.

## 2016-02-15 NOTE — Evaluation (Signed)
Physical Therapy Evaluation Patient Details Name: Seth Hunter MRN: [id number] DOB: 03/02/1918 Today's Date: 02/15/2016   History of Present Illness  83 yo male admitted with UTI, acute non displaced L sup/inf pubic rami fractures, hematomas. Hx of L femur ORIF, osteoporosis, pacemaker, COPD, lung mass. Pt is from home followed by hospice.   Clinical Impression  On eval, pt required Mod assist +2 for mobility. He was able to stand at side of bed with a RW. He was unable to take any steps on today. Moderate pain L LE with activity. No family present during session. Recommend ST rehab at SNF. Will follow and progress activity as tolerated.     Follow Up Recommendations SNF    Equipment Recommendations   (TBD at next venue)    Recommendations for Other Services       Precautions / Restrictions Precautions Precautions: Fall Restrictions LLE Weight Bearing: Weight bearing as tolerated      Mobility  Bed Mobility Overal bed mobility: Needs Assistance Bed Mobility: Supine to Sit;Sit to Supine     Supine to sit: Mod assist;+2 for physical assistance;+2 for safety/equipment;HOB elevated Sit to supine: Mod assist;+2 for physical assistance;+2 for safety/equipment;HOB elevated   General bed mobility comments: Assist for trunk and bil LEs. Utilized bedpad. Multimodal cueing required.   Transfers Overall transfer level: Needs assistance Equipment used: Rolling walker (2 wheeled) Transfers: Sit to/from Stand Sit to Stand: Mod assist;+2 physical assistance;+2 safety/equipment;From elevated surface         General transfer comment: Assist to rise, stabilize walker and pt, control descent. Pt was able to stand but he could not take any steps.   Ambulation/Gait             General Gait Details: NT-pt unable  Stairs            Wheelchair Mobility    Modified Rankin (Stroke Patients Only)       Balance Overall balance assessment: Needs assistance;History of Falls   Sitting balance-Leahy Scale: Fair Sitting balance - Comments: Min assist to stabilize initially however he progressed to Min guard with time.  Postural control: Posterior lean Standing balance support: Bilateral upper extremity supported Standing balance-Leahy Scale: Poor                               Pertinent Vitals/Pain Pain Assessment: Faces Faces Pain Scale: Hurts whole lot Pain Location: L LE with activity Pain Descriptors / Indicators: Grimacing Pain Intervention(s): Limited activity within patient's tolerance;Repositioned;Premedicated before session    Home Living Family/patient expects to be discharged to:: Skilled nursing facility Living Arrangements: Alone                    Prior Function Level of Independence: Needs assistance   Gait / Transfers Assistance Needed: uses rollator for ambulation           Hand Dominance        Extremity/Trunk Assessment   Upper Extremity Assessment Upper Extremity Assessment: Defer to OT evaluation    Lower Extremity Assessment Lower Extremity Assessment: Generalized weakness;LLE deficits/detail LLE: Unable to fully assess due to pain    Cervical / Trunk Assessment Cervical / Trunk Assessment: Kyphotic  Communication   Communication: HOH  Cognition Arousal/Alertness: Awake/alert Behavior During Therapy: WFL for tasks assessed/performed Overall Cognitive Status: Difficult to assess  General Comments      Exercises     Assessment/Plan    PT Assessment Patient needs continued PT services  PT Problem List Decreased strength;Decreased mobility;Decreased activity tolerance;Decreased balance;Decreased knowledge of use of DME;Pain;Decreased range of motion;Decreased skin integrity          PT Treatment Interventions DME instruction;Therapeutic activities;Gait training;Therapeutic exercise;Patient/family education;Functional mobility training;Balance training    PT  Goals (Current goals can be found in the Care Plan section)  Acute Rehab PT Goals Patient Stated Goal: none stated PT Goal Formulation: With patient Time For Goal Achievement: 02/29/16 Potential to Achieve Goals: Good    Frequency Min 3X/week   Barriers to discharge        Co-evaluation               End of Session Equipment Utilized During Treatment: Gait belt Activity Tolerance: Patient limited by pain Patient left: in bed;with call bell/phone within reach;with bed alarm set           Time: 7510-2585 PT Time Calculation (min) (ACUTE ONLY): 15 min   Charges:   PT Evaluation $PT Eval Low Complexity: 1 Procedure     PT G Codes:        Weston Anna, MPT Pager: [phone]

## 2016-02-15 NOTE — Clinical Social Work Placement (Signed)
   CLINICAL SOCIAL WORK PLACEMENT  NOTE  Date:  02/15/2016  Patient Details  Name: Seth Hunter MRN: [id number] Date of Birth: [birth year]  Clinical Social Work is seeking post-discharge placement for this patient at the [place] level of care (*CSW will initial, date and re-position this form in  chart as items are completed):  Yes   Patient/family provided with [place] Work Department's list of facilities offering this level of care within the geographic area requested by the patient (or if unable, by the patient's family).  Yes   Patient/family informed of their freedom to choose among providers that offer the needed level of care, that participate in Medicare, Medicaid or managed care program needed by the patient, have an available bed and are willing to accept the patient.  Yes   Patient/family informed of [place]'s ownership interest in [facility] and [facility], as well as of the fact that they are under no obligation to receive care at these facilities.  PASRR submitted to EDS on 02/15/16     PASRR number received on       Existing PASRR number confirmed on 02/15/16     FL2 transmitted to all facilities in geographic area requested by pt/family on 02/15/16     FL2 transmitted to all facilities within larger geographic area on       Patient informed that his/her managed care company has contracts with or will negotiate with certain facilities, including the following:            Patient/family informed of bed offers received.  Patient chooses bed at       Physician recommends and patient chooses bed at      Patient to be transferred to   on  .  Patient to be transferred to facility by       Patient family notified on   of transfer.  Name of family member notified:        PHYSICIAN Please prepare priority discharge summary, including medications, Please sign FL2, Please sign DNR     Additional Comment:     _______________________________________________ Glendon Axe A 02/15/2016, 2:45 PM

## 2016-02-15 NOTE — Progress Notes (Signed)
OT Cancellation Note  Patient Details Name: KIPPY GOHMAN MRN: [id number] DOB: 30-Jul-1918   Cancelled Treatment:    Reason Eval/Treat Not Completed: Other (comment)  Noted PT's evaluation and recommendation for SNF. Will defer OT eval to SNF.  Kari Baars, [place]  Payton Mccallum D 02/15/2016, 1:02 PM

## 2016-02-15 NOTE — Clinical Social Work Note (Signed)
Clinical Social Work Assessment  Patient Details  Name: Seth Hunter MRN: [id number] Date of Birth: 1918/05/13  Date of referral:  02/15/16               Reason for consult:  Facility Placement, Discharge Planning                Permission sought to share information with:  Family Supports, Case Manager, Chartered certified accountant granted to share information::  Yes, Verbal Permission Granted  Name::      [name] Surveyor, quantity )  Agency::   (SNF's )  Relationship::   (Niece )  Contact Information:   [phone])  Housing/Transportation Living arrangements for the past 2 months:  Single Family Home Source of Information:  Other (Comment Required) (Niece ) Patient Interpreter Needed:  None Criminal Activity/Legal Involvement Pertinent to Current Situation/Hospitalization:  No - Comment as needed Significant Relationships:  Other Family Members, Other(Comment) (Niece ) Lives with:  Other (Comment) (Niece ) Do you feel safe going back to the place where you live?  No Need for family participation in patient care:  Yes (Comment)  Care giving concerns: Patient from home with fall and requiring STR at skilled level.    Social Worker assessment / plan: MSW spoke with patient's niece, [name] in reference to post-acute placement for SNF. MSW introduced MSW role and SNF process. Pt's niece reported that patient has been at [facility] twice in the past and would prefer that patient returns once medically stable for SNF. Patient is currently under [facility] service which does need to be revoked before going to SNF per facility representative. Patient will also need 3 midnight qualifying stay under Medicare guidelines. Patient can receive palliative care services at SNF if ordered by attending.   No further concerns reported by niece at this time. MSW will continue to follow patient and pt's family for continued support and to facilitate pt's dc needs once  medically stable.   Employment status:  Retired Forensic scientist:  Information systems manager, Other (Comment Required) ([facility]) PT Recommendations:  [place] / Referral to community resources:  [place]  Patient/Family's Response to care: Patient disoriented. Pt's niece agreeable to SNF placement and prefers [facility] Rehab. Pt's niece supportive, involved in care and appreciated sw intervention.   Patient/Family's Understanding of and Emotional Response to Diagnosis, Current Treatment, and Prognosis: Patient's niece understanding of medical interventions and need for post-acute care.    Emotional Assessment Appearance:  Appears stated age Attitude/Demeanor/Rapport:  Unable to Assess Affect (typically observed):  Unable to Assess Orientation:  Oriented to Self Alcohol / Substance use:  Not Applicable Psych involvement (Current and /or in the community):  No (Comment)  Discharge Needs  Concerns to be addressed:  Denies Needs/Concerns at this time Readmission within the last 30 days:  No Current discharge risk:  Dependent with Mobility Barriers to Discharge:  Continued Medical Work up   Glendon Axe A 02/15/2016, 2:47 PM

## 2016-02-15 NOTE — Progress Notes (Addendum)
PROGRESS NOTE    MARILYN WING  TMA:[id number] DOB: 1918-05-20 DOA: 02/14/2016 PCP: Elsie Stain, MD   Brief Narrative: EVEREST BROD is a 83 y.o. male with medical history significant of hypertension, abnormal TSH, status post pacemaker, history of lower GI bleed likely diverticular hemorrhage, COPD, HTN, BPH, Presented with unwitnessed fall. Found to have leukocytosis, UTI.    Assessment & Plan:   Active Problems:   Essential hypertension   BPH (benign prostatic hyperplasia)   Lung mass   Leukocytosis   UTI (urinary tract infection)   Acute encephalopathy   Fall   Pelvic fracture (HCC)   1-Left Hip pain; Acute nondisplaced LEFT superior and inferior pubic rami fractures. LEFT hip intramuscular hematomas. On CT scan.  Pain management.  PT evaluation.  Will need placement   UTI;  UA with too numerous to count WBC.  Follow urine culture.  Continue with ceftriaxone due to leukocytosis , WBC 32 K.   AKI; improved with IV fluids.   Chronic leukocytosis; worsening probably related to infection. Follow trend.  No further evaluation per family wishes.   BPH; continue with Proscar.   HTN; hold diuretics at this time due to AKI.     DVT prophylaxis: SCD Code Status: DNR Family Communication: none at bedside.  Disposition Plan: to be determine.    Consultants:   none   Procedures: none   Antimicrobials:   Ceftriaxone.    Subjective: Confuse.  Denies pain, received pain medication this morning.  Needs assistance for feeding.   Objective: Vitals:   02/15/16 0312 02/15/16 0444 02/15/16 1026 02/15/16 1420  BP: 137/66 126/75 140/78 129/63  Pulse: 97 85 85 90  Resp: '20 20 20 18  '$ Temp: 98.6 F (37 C) 98 F (36.7 C) 98.2 F (36.8 C) 98.5 F (36.9 C)  TempSrc: Oral Oral Oral Oral  SpO2:  93% 97% 95%  Weight:        Intake/Output Summary (Last 24 hours) at 02/15/16 1444 Last data filed at 02/15/16 1100  Gross per 24 hour  Intake          1961.25 ml    Output               50 ml  Net          1911.25 ml   Filed Weights   02/15/16 0207  Weight: 78.1 kg (172 lb 3.2 oz)    Examination:  General exam: Appears calm and comfortable , pleasantly confused. Chronic ill appearing.  Respiratory system: bilateral ronchus.  Cardiovascular system: S1 & S2 heard, distant sounds.  Gastrointestinal system: Abdomen is nondistended, soft and nontender. Central nervous system: Alert , confused.  Extremities: Symmetric 5 x 5 power.      Data Reviewed: I have personally reviewed following labs and imaging studies  CBC:  Recent Labs Lab 02/14/16 1753 02/15/16 0451  WBC 36.8* 32.0*  NEUTROABS 6.6  --   HGB 13.0 12.1*  HCT 39.5 36.1*  MCV 95.9 95.0  PLT 276 256   Basic Metabolic Panel:  Recent Labs Lab 02/14/16 1753 02/15/16 0451  NA 143 141  K 4.6 3.6  CL 107 109  CO2 28 24  GLUCOSE 104* 101*  BUN 28* 23*  CREATININE 1.26* 0.97  CALCIUM 8.9 8.2*   GFR: CrCl cannot be calculated (Unknown ideal weight.). Liver Function Tests:  Recent Labs Lab 02/15/16 0451  ALBUMIN 2.7*   No results for input(s): LIPASE, AMYLASE in the last 168 hours. No results  for input(s): AMMONIA in the last 168 hours. Coagulation Profile: No results for input(s): INR, PROTIME in the last 168 hours. Cardiac Enzymes: No results for input(s): CKTOTAL, CKMB, CKMBINDEX, TROPONINI in the last 168 hours. BNP (last 3 results) No results for input(s): PROBNP in the last 8760 hours. HbA1C: No results for input(s): HGBA1C in the last 72 hours. CBG: No results for input(s): GLUCAP in the last 168 hours. Lipid Profile: No results for input(s): CHOL, HDL, LDLCALC, TRIG, CHOLHDL, LDLDIRECT in the last 72 hours. Thyroid Function Tests: No results for input(s): TSH, T4TOTAL, FREET4, T3FREE, THYROIDAB in the last 72 hours. Anemia Panel: No results for input(s): VITAMINB12, FOLATE, FERRITIN, TIBC, IRON, RETICCTPCT in the last 72 hours. Sepsis Labs: No  results for input(s): PROCALCITON, LATICACIDVEN in the last 168 hours.  No results found for this or any previous visit (from the past 240 hour(s)).       Radiology Studies: Dg Chest 2 View  Result Date: 02/14/2016 CLINICAL DATA:  Weakness.  Fall today. EXAM: CHEST  2 VIEW COMPARISON:  01/07/2013 FINDINGS: Normal heart size. Double lead left subclavian pacemaker device and leads are stable and intact. Tips are in the right atrium and right ventricle. No pneumothorax or pleural effusion. Lungs are very under aerated and there is scattered atelectasis. IMPRESSION: Low volumes and scattered atelectasis. Electronically Signed   By: Marybelle Killings M.D.   On: 02/14/2016 19:50   Ct Head Wo Contrast  Result Date: 02/14/2016 CLINICAL DATA:  Acute encephalopathy. EXAM: CT HEAD WITHOUT CONTRAST TECHNIQUE: Contiguous axial images were obtained from the base of the skull through the vertex without intravenous contrast. COMPARISON:  09/10/2011 FINDINGS: Brain: There is no intracranial hemorrhage, mass or evidence of acute infarction. There is moderate generalized atrophy. There is moderate chronic microvascular ischemic change. There is no significant extra-axial fluid collection. No acute intracranial findings are evident. Vascular: No hyperdense vessel or unexpected calcification. Skull: Normal. Negative for fracture or focal lesion. Sinuses/Orbits: No acute finding. Other: None. IMPRESSION: No acute intracranial findings. There is moderate generalized atrophy and chronic appearing white matter hypodensities which likely represent small vessel ischemic disease. Electronically Signed   By: Andreas Newport M.D.   On: 02/14/2016 23:45   Ct Femur Left Wo Contrast  Result Date: 02/15/2016 CLINICAL DATA:  LEFT hip pain after fall 3 days ago. Altered mental status, history of dementia. EXAM: CT OF THE LOWER LEFT EXTREMITY WITHOUT CONTRAST TECHNIQUE: Multidetector CT imaging of the lower left extremity was performed  according to the standard protocol. COMPARISON:  LEFT hip radiographs February 14, 2016 at 1800 hours. FINDINGS: Bones/Joint/Cartilage Acute nondisplaced LEFT superior and inferior pubic rami fractures extending to the pubis. LEFT femur is intact with intramedullary rod and screw fixation, hardware is intact. Old LEFT intertrochanteric fracture. No dislocation. No periprosthetic lucency. Osteopenia. Severe degenerative change of the LEFT knee. Ligaments Suboptimally assessed by CT. Muscles and Tendons LEFT gluteal hematoma. Thickened LEFT pectineus and adductor/ obturator externus muscles hematoma. No subcutaneous gas or radiopaque foreign bodies. Mild atherosclerosis. Soft tissues Small fat containing LEFT inguinal hernia. Partially imaged prostatomegaly. IMPRESSION: Acute nondisplaced LEFT superior and inferior pubic rami fractures. LEFT hip intramuscular hematomas. LEFT femur ORIF without hardware failure. Osteopenia. Electronically Signed   By: Elon Alas M.D.   On: 02/15/2016 00:11   Dg Hip Unilat With Pelvis 2-3 Views Left  Result Date: 02/14/2016 CLINICAL DATA:  Fall EXAM: DG HIP (WITH OR WITHOUT PELVIS) 2-3V LEFT COMPARISON:  None. FINDINGS: Dynamic compression screw and  rod are present in the proximal left femur. No acute fracture. No dislocation. Osteopenia. Degenerative changes in the lower lumbar spine. No breakage or loosening of the hardware. IMPRESSION: No acute bony pathology. Chronic and postoperative changes are noted. Electronically Signed   By: Marybelle Killings M.D.   On: 02/14/2016 18:13        Scheduled Meds: . cefTRIAXone (ROCEPHIN)  IV  1 g Intravenous Q24H  . docusate sodium  100 mg Oral Daily  . feeding supplement (ENSURE ENLIVE)  237 mL Oral BID BM  . fentaNYL (SUBLIMAZE) injection  12.5 mcg Intravenous Once  . finasteride  5 mg Oral Daily  . loratadine  5-10 mg Oral Daily  . senna  1 tablet Oral BID   Continuous Infusions:   LOS: 0 days    Time spent: 35  minutes.     Elmarie Shiley, MD [facility] Pager [phone]  If 7PM-7AM, please contact night-coverage [email or web address] Password [email or web address] 02/15/2016, 2:44 PM

## 2016-02-15 NOTE — NC FL2 (Signed)
[place] LEVEL OF CARE SCREENING TOOL     IDENTIFICATION  Patient Name: Seth Hunter Birthdate: Oct 04, 1918 Sex: male Admission Date (Current Location): 02/14/2016  [facility] and [place] Number:  Herbalist and Address:  [facility],  [place] [address], [place]      Provider Number: [id number]  Attending Physician Name and Address:  Elmarie Shiley, MD  Relative Name and Phone Number:       Current Level of Care: Hospital Recommended Level of Care: [place] Prior Approval Number:    Date Approved/Denied:   PASRR Number:  [id number])  Discharge Plan: SNF    Current Diagnoses: Patient Active Problem List   Diagnosis Date Noted  . Pelvic fracture ([place]) 02/15/2016  . Leukocytosis 02/14/2016  . UTI (urinary tract infection) 02/14/2016  . Acute encephalopathy 02/14/2016  . Fall 02/14/2016  . Cough 03/18/2015  . DNR (do not resuscitate) 11/11/2014  . Skin lesion 11/11/2014  . Lung mass 08/20/2014  . Bilateral impacted cerumen 03/16/2014  . Abnormal TSH 03/16/2014  . Lower GI bleed 02/27/2014  . Pacemaker 02/27/2014  . Other dysphagia 09/03/2013  . PNA (pneumonia) 02/13/2013  . Unspecified vitamin D deficiency 02/13/2013  . BPH (benign prostatic hyperplasia) 10/21/2012  . Closed left hip fracture ([place]) 10/20/2012  . Pacemaker -MDT   . Essential hypertension 09/26/2007    Orientation RESPIRATION BLADDER Height & Weight     Self  Normal Incontinent Weight: 172 lb 3.2 oz (78.1 kg) Height:     BEHAVIORAL SYMPTOMS/MOOD NEUROLOGICAL BOWEL NUTRITION STATUS   (none )  (none ) Continent Diet (Regular )  AMBULATORY STATUS COMMUNICATION OF NEEDS Skin   Extensive Assist Verbally Normal                       Personal Care Assistance Level of Assistance  Bathing, Dressing, Feeding Bathing Assistance: Maximum assistance Feeding assistance: Limited assistance Dressing Assistance: Maximum assistance      Functional Limitations Info  Speech, Hearing, Sight Sight Info: Adequate Hearing Info: Impaired Speech Info: Adequate    SPECIAL CARE FACTORS FREQUENCY  PT (By licensed PT), OT (By licensed OT)     PT Frequency: 3 OT Frequency: 2            Contractures      Additional Factors Info  Code Status, Allergies Code Status Info: DNR Allergies Info: Coricidin Hbp Cough-cold Chlorpheniramine-dm, Penicillins           Current Medications (02/15/2016):  This is the current hospital active medication list Current Facility-Administered Medications  Medication Dose Route Frequency Provider Last Rate Last Dose  . bisacodyl (DULCOLAX) suppository 10 mg  10 mg Rectal Daily PRN Toy Baker, MD      . cefTRIAXone (ROCEPHIN) 1 g in dextrose 5 % 50 mL IVPB  1 g Intravenous Q24H Toy Baker, MD      . docusate sodium (COLACE) capsule 100 mg  100 mg Oral Daily Toy Baker, MD   100 mg at 02/15/16 0926  . feeding supplement (ENSURE ENLIVE) (ENSURE ENLIVE) liquid 237 mL  237 mL Oral BID BM Belkys A Regalado, MD      . fentaNYL (SUBLIMAZE) injection 12.5 mcg  12.5 mcg Intravenous Once Nat Christen, MD      . finasteride (PROSCAR) tablet 5 mg  5 mg Oral Daily Toy Baker, MD   5 mg at 02/15/16 0927  . HYDROcodone-acetaminophen (NORCO/VICODIN) 5-325 MG per tablet 1-2  tablet  1-2 tablet Oral Q6H PRN Toy Baker, MD   1 tablet at 02/15/16 1113  . loratadine (CLARITIN) tablet 5-10 mg  5-10 mg Oral Daily Toy Baker, MD   5 mg at 02/15/16 0927  . methocarbamol (ROBAXIN) tablet 500 mg  500 mg Oral Q6H PRN Toy Baker, MD       Or  . methocarbamol (ROBAXIN) 500 mg in dextrose 5 % 50 mL IVPB  500 mg Intravenous Q6H PRN Toy Baker, MD      . morphine 2 MG/ML injection 2 mg  2 mg Intravenous Q4H PRN Toy Baker, MD      . polyethylene glycol (MIRALAX / GLYCOLAX) packet 17 g  17 g Oral Daily PRN Toy Baker, MD      . senna (SENOKOT)  tablet 8.6 mg  1 tablet Oral BID Toy Baker, MD   8.6 mg at 02/15/16 7903     Discharge Medications: Please see discharge summary for a list of discharge medications.  Relevant Imaging Results:  Relevant Lab Results:   Additional Information SSN [id number]  Glendon Axe A

## 2016-02-15 NOTE — Progress Notes (Signed)
Paged MD for diet orders. Received orders for Reg diet.

## 2016-02-16 DIAGNOSIS — G9340 Encephalopathy, unspecified: Secondary | ICD-10-CM

## 2016-02-16 LAB — BASIC METABOLIC PANEL
Anion gap: 9 (ref 5–15)
BUN: 23 mg/dL — AB (ref 6–20)
CHLORIDE: 110 mmol/L (ref 101–111)
CO2: 24 mmol/L (ref 22–32)
Calcium: 8.5 mg/dL — ABNORMAL LOW (ref 8.9–10.3)
Creatinine, Ser: 0.95 mg/dL (ref 0.61–1.24)
Glucose, Bld: 92 mg/dL (ref 65–99)
Potassium: 3.9 mmol/L (ref 3.5–5.1)
SODIUM: 143 mmol/L (ref 135–145)

## 2016-02-16 LAB — VITAMIN D 25 HYDROXY (VIT D DEFICIENCY, FRACTURES): VIT D 25 HYDROXY: 15 ng/mL — AB (ref 30.0–100.0)

## 2016-02-16 LAB — CBC
HCT: 35.1 % — ABNORMAL LOW (ref 39.0–52.0)
HEMOGLOBIN: 11.8 g/dL — AB (ref 13.0–17.0)
MCH: 31.4 pg (ref 26.0–34.0)
MCHC: 33.6 g/dL (ref 30.0–36.0)
MCV: 93.4 fL (ref 78.0–100.0)
PLATELETS: 284 10*3/uL (ref 150–400)
RBC: 3.76 MIL/uL — AB (ref 4.22–5.81)
RDW: 14.8 % (ref 11.5–15.5)
WBC: 34 10*3/uL — AB (ref 4.0–10.5)

## 2016-02-16 LAB — GLUCOSE, CAPILLARY: Glucose-Capillary: 88 mg/dL (ref 65–99)

## 2016-02-16 MED ORDER — DEXTROSE-NACL 5-0.9 % IV SOLN
INTRAVENOUS | Status: DC
  Administered 2016-02-16 – 2016-02-18 (×4): via INTRAVENOUS

## 2016-02-16 MED ORDER — MORPHINE SULFATE (PF) 2 MG/ML IV SOLN: 0.5000 mg | INTRAVENOUS | Status: DC | PRN

## 2016-02-16 MED ORDER — TRAMADOL HCL 50 MG PO TABS
25.0000 mg | ORAL_TABLET | Freq: Four times a day (QID) | ORAL | Status: DC | PRN
  Administered 2016-02-16: 25 mg via ORAL
  Filled 2016-02-16: qty 1

## 2016-02-16 MED ORDER — VITAMIN D (ERGOCALCIFEROL) 1.25 MG (50000 UNIT) PO CAPS
50000.0000 [IU] | ORAL_CAPSULE | ORAL | Status: DC
  Administered 2016-02-16: 50000 [IU] via ORAL
  Filled 2016-02-16: qty 1

## 2016-02-16 NOTE — Progress Notes (Signed)
WL 1530 -  [facility] GIP RNvisit @ 1230pm  This is a covered and related GIP admission from 02/15/16 with an [facility] diagnosis of Carcinoma of Right Lung by Dr. Karie Georges. Code status is DNR. Patient's PCG called [facility] in regards to fall from Friday.  Patient was still having pain and problems with ambulation after fall.  Sandi, PCG, initiated EMS to come pick up patient and transport to hospital for further evaluation of hip pain.   Visited with patient in his room today, no one else was with patient at this time.  Patient was asleep when I visited.  Patient was hard to arouse.   Per Karna Christmas, RN, patient had been give Robaxin and she advised patient had been comfortably resting since, but hard to arouse.  Patient is able to do his incentive spirometer, with heavy direction from staff when he is awake.     Will continue to follow patient.   Thank you,  Edyth Gunnels, RN, [place] [facility] Liaison [phone]  All hospital liaison's are now on [place].  Please feel free to call me at the above number or call hospice at [phone] after 5pm.

## 2016-02-16 NOTE — Clinical Social Work Note (Signed)
Patient's niece aware that hospice service will need to be revoked before discharging to SNF. Pt's niece reported she will contact [facility] to revoke hospice service. Patient can receive palliative care services at SNF, niece aware. Patient has bed at [facility], [facility] and Rehab.   MSW remains available as needed.   Glendon Axe, MSW [phone] 02/16/2016 11:29 AM

## 2016-02-16 NOTE — Telephone Encounter (Signed)
Spoke to pt's niece Amy and gave Dr. Josefine Class instructions.

## 2016-02-16 NOTE — Telephone Encounter (Signed)
Please let his niece know that I have been following along with the records through the computer. I appreciate the help of all involved. Please let me know if there is anything I can do for her or the patient. Thanks.

## 2016-02-16 NOTE — Progress Notes (Signed)
PROGRESS NOTE    Seth Hunter  ZOX:[id number] DOB: [birth year] DOA: 02/14/2016 PCP: Elsie Stain, MD   Brief Narrative: Seth Hunter is a 83 y.o. male with medical history significant of hypertension, abnormal TSH, status post pacemaker, history of lower GI bleed likely diverticular hemorrhage, COPD, HTN, BPH, Presented with unwitnessed fall. Found to have leukocytosis, UTI.    Assessment & Plan:   Active Problems:   Essential hypertension   BPH (benign prostatic hyperplasia)   Lung mass   Leukocytosis   UTI (urinary tract infection)   Acute encephalopathy   Fall   Pelvic fracture ([facility])   1-Left Hip pain; Acute nondisplaced LEFT superior and inferior pubic rami fractures. LEFT hip intramuscular hematomas. On CT scan.  Pain management.  PT evaluation.   Acute encephalopathy;  Patient lethargic this morning, suspect related to medications.  Check CBG.  IV fluids change to D 5.  Will discontinue Robaxin.   UTI;  UA with too numerous to count WBC.  Follow urine culture. Growing proteus mirabile.  Continue with ceftriaxone due to leukocytosis , WBC 34 K.   AKI; improved with IV fluids.   Chronic leukocytosis; worsening probably related to infection. Follow trend.  No further evaluation per family wishes.   BPH; continue with Proscar.   HTN; hold diuretics at this time due to AKI.     DVT prophylaxis: SCD Code Status: DNR Family Communication: nice over phone  Disposition Plan: to be determine. No stable to be transfer, patient is lethargic this morning.    Consultants:   none   Procedures: none   Antimicrobials:   Ceftriaxone.    Subjective: patient lethargic this morning. Barely open eyes.  He has not been eating today due to AMS.    Objective: Vitals:   02/15/16 2108 02/16/16 0205 02/16/16 0500 02/16/16 1039  BP: (!) 150/76 136/73 (!) 155/78 (!) 121/99  Pulse: 86 84 88 78  Resp: '16 15 15 15  '$ Temp: 98.7 F (37.1 C) 99.5 F (37.5 C) 99.2 F  (37.3 C) 99.3 F (37.4 C)  TempSrc: Axillary Axillary Axillary Axillary  SpO2: 94% 93% 94% 94%  Weight:        Intake/Output Summary (Last 24 hours) at 02/16/16 1414 Last data filed at 02/16/16 1000  Gross per 24 hour  Intake             1275 ml  Output              725 ml  Net              550 ml   Filed Weights   02/15/16 0207  Weight: 78.1 kg (172 lb 3.2 oz)    Examination:  General exam: lethargic.  Respiratory system: bilateral ronchus.  Cardiovascular system: S1 & S2 heard, distant sounds.  Gastrointestinal system: Abdomen is nondistended, soft and nontender. Central nervous system: Alert , confused.  Extremities: moves extremities at times.       Data Reviewed: I have personally reviewed following labs and imaging studies  CBC:  Recent Labs Lab 02/14/16 1753 02/15/16 0451 02/16/16 0459  WBC 36.8* 32.0* 34.0*  NEUTROABS 6.6  --   --   HGB 13.0 12.1* 11.8*  HCT 39.5 36.1* 35.1*  MCV 95.9 95.0 93.4  PLT 276 271 811   Basic Metabolic Panel:  Recent Labs Lab 02/14/16 1753 02/15/16 0451 02/16/16 0459  NA 143 141 143  K 4.6 3.6 3.9  CL 107 109 110  CO2 28  24 24  GLUCOSE 104* 101* 92  BUN 28* 23* 23*  CREATININE 1.26* 0.97 0.95  CALCIUM 8.9 8.2* 8.5*   GFR: CrCl cannot be calculated (Unknown ideal weight.). Liver Function Tests:  Recent Labs Lab 02/15/16 0451  ALBUMIN 2.7*   No results for input(s): LIPASE, AMYLASE in the last 168 hours. No results for input(s): AMMONIA in the last 168 hours. Coagulation Profile: No results for input(s): INR, PROTIME in the last 168 hours. Cardiac Enzymes: No results for input(s): CKTOTAL, CKMB, CKMBINDEX, TROPONINI in the last 168 hours. BNP (last 3 results) No results for input(s): PROBNP in the last 8760 hours. HbA1C: No results for input(s): HGBA1C in the last 72 hours. CBG:  Recent Labs Lab 02/16/16 1216  GLUCAP 88   Lipid Profile: No results for input(s): CHOL, HDL, LDLCALC, TRIG,  CHOLHDL, LDLDIRECT in the last 72 hours. Thyroid Function Tests: No results for input(s): TSH, T4TOTAL, FREET4, T3FREE, THYROIDAB in the last 72 hours. Anemia Panel: No results for input(s): VITAMINB12, FOLATE, FERRITIN, TIBC, IRON, RETICCTPCT in the last 72 hours. Sepsis Labs: No results for input(s): PROCALCITON, LATICACIDVEN in the last 168 hours.  Recent Results (from the past 240 hour(s))  Urine culture     Status: Abnormal (Preliminary result)   Collection Time: 02/14/16  8:50 PM  Result Value Ref Range Status   Specimen Description URINE, RANDOM  Final   Special Requests NONE  Final   Culture (A)  Final    >=100,000 COLONIES/mL PROTEUS MIRABILIS SUSCEPTIBILITIES TO FOLLOW Performed at [facility], [address], [place], [place] [address]    Report Status PENDING  Incomplete         Radiology Studies: Dg Chest 2 View  Result Date: 02/14/2016 CLINICAL DATA:  Weakness.  Fall today. EXAM: CHEST  2 VIEW COMPARISON:  01/07/2013 FINDINGS: Normal heart size. Double lead left subclavian pacemaker device and leads are stable and intact. Tips are in the right atrium and right ventricle. No pneumothorax or pleural effusion. Lungs are very under aerated and there is scattered atelectasis. IMPRESSION: Low volumes and scattered atelectasis. Electronically Signed   By: Marybelle Killings M.D.   On: 02/14/2016 19:50   Ct Head Wo Contrast  Result Date: 02/14/2016 CLINICAL DATA:  Acute encephalopathy. EXAM: CT HEAD WITHOUT CONTRAST TECHNIQUE: Contiguous axial images were obtained from the base of the skull through the vertex without intravenous contrast. COMPARISON:  09/10/2011 FINDINGS: Brain: There is no intracranial hemorrhage, mass or evidence of acute infarction. There is moderate generalized atrophy. There is moderate chronic microvascular ischemic change. There is no significant extra-axial fluid collection. No acute intracranial findings are evident. Vascular: No hyperdense vessel or  unexpected calcification. Skull: Normal. Negative for fracture or focal lesion. Sinuses/Orbits: No acute finding. Other: None. IMPRESSION: No acute intracranial findings. There is moderate generalized atrophy and chronic appearing white matter hypodensities which likely represent small vessel ischemic disease. Electronically Signed   By: Andreas Newport M.D.   On: 02/14/2016 23:45   Ct Femur Left Wo Contrast  Result Date: 02/15/2016 CLINICAL DATA:  LEFT hip pain after fall 3 days ago. Altered mental status, history of dementia. EXAM: CT OF THE LOWER LEFT EXTREMITY WITHOUT CONTRAST TECHNIQUE: Multidetector CT imaging of the lower left extremity was performed according to the standard protocol. COMPARISON:  LEFT hip radiographs February 14, 2016 at 1800 hours. FINDINGS: Bones/Joint/Cartilage Acute nondisplaced LEFT superior and inferior pubic rami fractures extending to the pubis. LEFT femur is intact with intramedullary rod and  screw fixation, hardware is intact. Old LEFT intertrochanteric fracture. No dislocation. No periprosthetic lucency. Osteopenia. Severe degenerative change of the LEFT knee. Ligaments Suboptimally assessed by CT. Muscles and Tendons LEFT gluteal hematoma. Thickened LEFT pectineus and adductor/ obturator externus muscles hematoma. No subcutaneous gas or radiopaque foreign bodies. Mild atherosclerosis. Soft tissues Small fat containing LEFT inguinal hernia. Partially imaged prostatomegaly. IMPRESSION: Acute nondisplaced LEFT superior and inferior pubic rami fractures. LEFT hip intramuscular hematomas. LEFT femur ORIF without hardware failure. Osteopenia. Electronically Signed   By: Elon Alas M.D.   On: 02/15/2016 00:11   Dg Hip Unilat With Pelvis 2-3 Views Left  Result Date: 02/14/2016 CLINICAL DATA:  Fall EXAM: DG HIP (WITH OR WITHOUT PELVIS) 2-3V LEFT COMPARISON:  None. FINDINGS: Dynamic compression screw and rod are present in the proximal left femur. No acute fracture. No  dislocation. Osteopenia. Degenerative changes in the lower lumbar spine. No breakage or loosening of the hardware. IMPRESSION: No acute bony pathology. Chronic and postoperative changes are noted. Electronically Signed   By: Marybelle Killings M.D.   On: 02/14/2016 18:13        Scheduled Meds: . cefTRIAXone (ROCEPHIN)  IV  1 g Intravenous Q24H  . docusate sodium  100 mg Oral Daily  . feeding supplement (ENSURE ENLIVE)  237 mL Oral BID BM  . fentaNYL (SUBLIMAZE) injection  12.5 mcg Intravenous Once  . finasteride  5 mg Oral Daily  . loratadine  5-10 mg Oral Daily  . senna  1 tablet Oral BID  . Vitamin D (Ergocalciferol)  50,000 Units Oral Q7 days   Continuous Infusions: . dextrose 5 % and 0.9% NaCl 75 mL/hr at 02/16/16 1214     LOS: 1 day    Time spent: 35 minutes.     Elmarie Shiley, MD [facility] Pager [phone]  If 7PM-7AM, please contact night-coverage [email or web address] Password [email or web address] 02/16/2016, 2:14 PM

## 2016-02-17 LAB — CBC
HCT: 34.9 % — ABNORMAL LOW (ref 39.0–52.0)
Hemoglobin: 11.8 g/dL — ABNORMAL LOW (ref 13.0–17.0)
MCH: 32.2 pg (ref 26.0–34.0)
MCHC: 33.8 g/dL (ref 30.0–36.0)
MCV: 95.4 fL (ref 78.0–100.0)
Platelets: 289 K/uL (ref 150–400)
RBC: 3.66 MIL/uL — ABNORMAL LOW (ref 4.22–5.81)
RDW: 14.8 % (ref 11.5–15.5)
WBC: 31.1 K/uL — ABNORMAL HIGH (ref 4.0–10.5)

## 2016-02-17 LAB — URINE CULTURE: Culture: >=100000 — AB

## 2016-02-17 MED ORDER — DEXTROSE 5 % IV SOLN
1.0000 g | INTRAVENOUS | Status: DC
  Filled 2016-02-17: qty 10

## 2016-02-17 MED ORDER — TRAMADOL HCL 50 MG PO TABS: 25.0000 mg | ORAL_TABLET | Freq: Four times a day (QID) | ORAL | 0 refills | Status: DC | PRN

## 2016-02-17 MED ORDER — CIPROFLOXACIN HCL 0.3 % OP SOLN
1.0000 [drp] | OPHTHALMIC | Status: DC
  Administered 2016-02-17 – 2016-02-18 (×5): 1 [drp] via OPHTHALMIC
  Filled 2016-02-17: qty 2.5

## 2016-02-17 MED ORDER — ENSURE ENLIVE PO LIQD: 237.0000 mL | Freq: Two times a day (BID) | ORAL | 12 refills | Status: DC

## 2016-02-17 MED ORDER — CIPROFLOXACIN HCL 0.3 % OP SOLN: 1.0000 [drp] | OPHTHALMIC | 0 refills | Status: DC

## 2016-02-17 MED ORDER — CEFDINIR 300 MG PO CAPS: 300.0000 mg | ORAL_CAPSULE | Freq: Two times a day (BID) | ORAL | 0 refills | Status: DC

## 2016-02-17 MED ORDER — BISACODYL 10 MG RE SUPP: 10.0000 mg | Freq: Every day | RECTAL | 0 refills | Status: DC | PRN

## 2016-02-17 MED ORDER — VITAMIN D (ERGOCALCIFEROL) 1.25 MG (50000 UNIT) PO CAPS: 50000.0000 [IU] | ORAL_CAPSULE | ORAL | 0 refills | Status: DC

## 2016-02-17 MED ORDER — SENNA 8.6 MG PO TABS: 1.0000 | ORAL_TABLET | Freq: Two times a day (BID) | ORAL | 0 refills | Status: DC

## 2016-02-17 MED ORDER — DEXTROSE 5 % IV SOLN
1.0000 g | Freq: Once | INTRAVENOUS | Status: AC
Start: 2016-02-17 — End: 2016-02-17
  Administered 2016-02-17: 1 g via INTRAVENOUS
  Filled 2016-02-17: qty 10

## 2016-02-17 MED ORDER — DOCUSATE SODIUM 100 MG PO CAPS: 100.0000 mg | ORAL_CAPSULE | Freq: Every day | ORAL | 0 refills | Status: DC

## 2016-02-17 NOTE — Progress Notes (Signed)
[facility] 1530 - [facility]- GIP RNvisit @ 8 AM  This is a covered and related GIP admission from 02/15/16 with an [facility] diagnosis of Carcinoma of Right Lung by Dr. Karie Georges. Code status is DNR. Patient's PCG called [facility] in regards to fall from Friday. Patient was still having pain and problems with ambulation after fall. Sandi, PCG, initiated EMS to come pick up patient and transport to hospital for further evaluation of hip pain.   Patient seen in room with no family present at time of visit.  Patient does not arouse to verbal stimuli. RN stated he was awake earlier and able to speak briefly to staff. Patient is receiving IV Rocephin via a PIV.  He required one dose of 25 mg Tramadol for pain, yesterday evening. Robaxin was discontinued d/t patient lethargy. He ate approximately 50% of his dinner tray last night.   Per chart review, plan is to discharge to SNF for rehab, once medically stable.  [facility] SW will continue to follow and complete paperwork when date is confirmed for discharge to SNF.  Please contact with any hospice-related questions or concerns.  Thank you,  Freddi Starr, RN, [place] [facility] Liaison [phone]  All hospital liaison's are now on [place]. Please feel free to call me at the above number or call hospice at [phone] after 6pm.

## 2016-02-17 NOTE — Progress Notes (Signed)
Plan for d/c to SNF, discharge planning per CSW. [phone] 

## 2016-02-17 NOTE — Progress Notes (Signed)
Spoke with pt's niece, [name].  Sandi aware that pt will need to revoke hospice medicare benefit if he wants to receive rehab services at LTC facility.  Sandi voiced understanding.  Will follow up with Sandi once plans for rehab are finalized.  [place], [place] ext: [phone]

## 2016-02-17 NOTE — Discharge Summary (Addendum)
Physician Discharge Summary  [name] CVE:[id number] DOB: [birth year] DOA: 02/14/2016  PCP: Elsie Stain, MD  Admit date: 02/14/2016 Discharge date: 02/17/2016  Admitted From: Home  Disposition:  SNF  Recommendations for Outpatient Follow-up:  1. Follow up with PCP in 1-2 weeks 2. Please obtain BMP/CBC in one week 3. Please make sure palliative care team follows patient at SNF.    Discharge Condition: Stable.  CODE STATUS: DNR Diet recommendation: Heart Healthy  Brief/Interim Summary: Seth Hunter a 83 y.o.malewith medical history significant ofhypertension, abnormal TSH, status post pacemaker, history of lower GI bleed likely diverticular hemorrhage, COPD, HTN, BPH, Presented with unwitnessed fall. Found to have leukocytosis, UTI.    Assessment & Plan: 1-Left Hip pain; Acute nondisplaced LEFT superior and inferior pubic rami fractures. LEFT hip intramuscular hematomas. On CT scan.  Pain management.  PT therapy at SNF  Acute encephalopathy;  Patient was lethargic the morning of 1-31. , suspect related to medications (Robaxin and vicodin).  He is alert this morning. Resolved.  Avoid strong sedatives. He has been tolerating low dose tramadol.  Addendum. Patient has not been eating enough , minimal intake with lunch. Will keep him overnight on IV fluids./   UTI;  UA with too numerous to count WBC.  Urine culture; proteus mirabile. Sensitive to ceftriaxone.  He has received total 3 days of IV ceftriaxone. Continue ceftriaxone tomorrow.  He will be discharge on omnicef for 4 days.   AKI; improved with IV fluids.   Chronic leukocytosis; worsening probably related to infection. Follow trend.  No further evaluation per family wishes.  WBC trending down.  repeat labs in 1 week.  Patient afebrile, on IV antibiotics.   BPH; continue with Proscar.   HTN; hold diuretics at this time due to AKI. Cr normalized.    Discharge Diagnoses:  Active Problems:    Essential hypertension   BPH (benign prostatic hyperplasia)   Lung mass   Leukocytosis   UTI (urinary tract infection)   Acute encephalopathy   Fall   Pelvic fracture [facility])    Discharge Instructions  Discharge Instructions    Diet - low sodium heart healthy    Complete by:  As directed    Increase activity slowly    Complete by:  As directed      Allergies as of 02/17/2016      Reactions   Coricidin Hbp Cough-cold [chlorpheniramine-dm] Other (See Comments)   Sedation.    Penicillins Other (See Comments)   Has patient had a PCN reaction causing immediate rash, facial/tongue/throat swelling, SOB or lightheadedness with hypotension: No Has patient had a PCN reaction causing severe rash involving mucus membranes or skin necrosis: No Has patient had a PCN reaction that required hospitalization No Has patient had a PCN reaction occurring within the last 10 years: No If all of the above answers are "NO", then may proceed with Cephalosporin use.      Medication List    STOP taking these medications   hydrochlorothiazide 12.5 MG capsule Commonly known as:  MICROZIDE     TAKE these medications   BIOFREEZE 4 % Gel Generic drug:  Menthol (Topical Analgesic) Apply 1 application topically daily as needed. Applied to knees for arthritis   bisacodyl 10 MG suppository Commonly known as:  DULCOLAX Place 1 suppository (10 mg total) rectally daily as needed for moderate constipation.   cefdinir 300 MG capsule Commonly known as:  OMNICEF Take 1 capsule (300 mg total) by mouth 2 (two) times daily.  ciprofloxacin 0.3 % ophthalmic solution Commonly known as:  CILOXAN Place 1 drop into the left eye every 4 (four) hours while awake. Administer 1 drop, every 2 hours, while awake, for 2 days. Then 1 drop, every 4 hours, while awake, for the next 5 days.   docusate sodium 100 MG capsule Commonly known as:  COLACE Take 1 capsule (100 mg total) by mouth daily. Start taking on:  02/18/2016    feeding supplement (ENSURE ENLIVE) Liqd Take 237 mLs by mouth 2 (two) times daily between meals.   finasteride 5 MG tablet Commonly known as:  PROSCAR TAKE 1 TABLET BY MOUTH DAILY   Garlic 10 MG Caps Take 1 capsule by mouth daily.   senna 8.6 MG Tabs tablet Commonly known as:  SENOKOT Take 1 tablet (8.6 mg total) by mouth 2 (two) times daily.   simethicone 80 MG chewable tablet Commonly known as:  MYLICON Chew 80 mg by mouth every 6 (six) hours as needed for flatulence.   traMADol 50 MG tablet Commonly known as:  ULTRAM Take 0.5 tablets (25 mg total) by mouth every 6 (six) hours as needed for moderate pain.   Vitamin D (Ergocalciferol) 50000 units Caps capsule Commonly known as:  DRISDOL Take 1 capsule (50,000 Units total) by mouth every 7 (seven) days. Start taking on:  02/23/2016       Allergies  Allergen Reactions  . Coricidin Hbp Cough-Cold [Chlorpheniramine-Dm] Other (See Comments)    Sedation.   Marland Kitchen Penicillins Other (See Comments)    Has patient had a PCN reaction causing immediate rash, facial/tongue/throat swelling, SOB or lightheadedness with hypotension: No Has patient had a PCN reaction causing severe rash involving mucus membranes or skin necrosis: No Has patient had a PCN reaction that required hospitalization No Has patient had a PCN reaction occurring within the last 10 years: No If all of the above answers are "NO", then may proceed with Cephalosporin use.    Consultations:  none   Procedures/Studies: Dg Chest 2 View  Result Date: 02/14/2016 CLINICAL DATA:  Weakness.  Fall today. EXAM: CHEST  2 VIEW COMPARISON:  01/07/2013 FINDINGS: Normal heart size. Double lead left subclavian pacemaker device and leads are stable and intact. Tips are in the right atrium and right ventricle. No pneumothorax or pleural effusion. Lungs are very under aerated and there is scattered atelectasis. IMPRESSION: Low volumes and scattered atelectasis. Electronically Signed    By: Marybelle Killings M.D.   On: 02/14/2016 19:50   Ct Head Wo Contrast  Result Date: 02/14/2016 CLINICAL DATA:  Acute encephalopathy. EXAM: CT HEAD WITHOUT CONTRAST TECHNIQUE: Contiguous axial images were obtained from the base of the skull through the vertex without intravenous contrast. COMPARISON:  09/10/2011 FINDINGS: Brain: There is no intracranial hemorrhage, mass or evidence of acute infarction. There is moderate generalized atrophy. There is moderate chronic microvascular ischemic change. There is no significant extra-axial fluid collection. No acute intracranial findings are evident. Vascular: No hyperdense vessel or unexpected calcification. Skull: Normal. Negative for fracture or focal lesion. Sinuses/Orbits: No acute finding. Other: None. IMPRESSION: No acute intracranial findings. There is moderate generalized atrophy and chronic appearing white matter hypodensities which likely represent small vessel ischemic disease. Electronically Signed   By: Andreas Newport M.D.   On: 02/14/2016 23:45   Ct Femur Left Wo Contrast  Result Date: 02/15/2016 CLINICAL DATA:  LEFT hip pain after fall 3 days ago. Altered mental status, history of dementia. EXAM: CT OF THE LOWER LEFT EXTREMITY WITHOUT CONTRAST TECHNIQUE:  Multidetector CT imaging of the lower left extremity was performed according to the standard protocol. COMPARISON:  LEFT hip radiographs February 14, 2016 at 1800 hours. FINDINGS: Bones/Joint/Cartilage Acute nondisplaced LEFT superior and inferior pubic rami fractures extending to the pubis. LEFT femur is intact with intramedullary rod and screw fixation, hardware is intact. Old LEFT intertrochanteric fracture. No dislocation. No periprosthetic lucency. Osteopenia. Severe degenerative change of the LEFT knee. Ligaments Suboptimally assessed by CT. Muscles and Tendons LEFT gluteal hematoma. Thickened LEFT pectineus and adductor/ obturator externus muscles hematoma. No subcutaneous gas or radiopaque  foreign bodies. Mild atherosclerosis. Soft tissues Small fat containing LEFT inguinal hernia. Partially imaged prostatomegaly. IMPRESSION: Acute nondisplaced LEFT superior and inferior pubic rami fractures. LEFT hip intramuscular hematomas. LEFT femur ORIF without hardware failure. Osteopenia. Electronically Signed   By: Elon Alas M.D.   On: 02/15/2016 00:11   Dg Hip Unilat With Pelvis 2-3 Views Left  Result Date: 02/14/2016 CLINICAL DATA:  Fall EXAM: DG HIP (WITH OR WITHOUT PELVIS) 2-3V LEFT COMPARISON:  None. FINDINGS: Dynamic compression screw and rod are present in the proximal left femur. No acute fracture. No dislocation. Osteopenia. Degenerative changes in the lower lumbar spine. No breakage or loosening of the hardware. IMPRESSION: No acute bony pathology. Chronic and postoperative changes are noted. Electronically Signed   By: Marybelle Killings M.D.   On: 02/14/2016 18:13    Subjective: Patient alert, he wake up answer questions. He has been eating more.   Discharge Exam: Vitals:   02/17/16 0625 02/17/16 0903  BP: 104/80 (!) 127/57  Pulse: 67 70  Resp: 16 (!) 23  Temp: 98.5 F (36.9 C) 98.6 F (37 C)   Vitals:   02/16/16 2005 02/17/16 0115 02/17/16 0625 02/17/16 0903  BP: 137/68 135/65 104/80 (!) 127/57  Pulse: 73 75 67 70  Resp: '14 15 16 '$ (!) 23  Temp: 98.9 F (37.2 C) 98.6 F (37 C) 98.5 F (36.9 C) 98.6 F (37 C)  TempSrc: Axillary Axillary Axillary Axillary  SpO2: 100% 98% 98% 95%  Weight:      Height:        General: Pt is alert in no distress Cardiovascular: RRR, S1/S2 +, no rubs, no gallops Respiratory: CTA bilaterally, no wheezing, no rhonchi  Abdominal: Soft, NT, ND, bowel sounds + Extremities: no edema, no cyanosis    The results of significant diagnostics from this hospitalization (including imaging, microbiology, ancillary and laboratory) are listed below for reference.     Microbiology: Recent Results (from the past 240 hour(s))  Urine culture      Status: Abnormal   Collection Time: 02/14/16  8:50 PM  Result Value Ref Range Status   Specimen Description URINE, RANDOM  Final   Special Requests NONE  Final   Culture >=100,000 COLONIES/mL PROTEUS MIRABILIS (A)  Final   Report Status 02/17/2016 FINAL  Final   Organism ID, Bacteria PROTEUS MIRABILIS (A)  Final      Susceptibility   Proteus mirabilis - MIC*    AMPICILLIN <=2 SENSITIVE Sensitive     CEFAZOLIN <=4 SENSITIVE Sensitive     CEFTRIAXONE <=1 SENSITIVE Sensitive     CIPROFLOXACIN >=4 RESISTANT Resistant     GENTAMICIN <=1 SENSITIVE Sensitive     IMIPENEM 2 SENSITIVE Sensitive     NITROFURANTOIN 128 RESISTANT Resistant     TRIMETH/SULFA >=320 RESISTANT Resistant     AMPICILLIN/SULBACTAM <=2 SENSITIVE Sensitive     PIP/TAZO <=4 SENSITIVE Sensitive     * >=100,000 COLONIES/mL PROTEUS MIRABILIS  Labs: BNP (last 3 results) No results for input(s): BNP in the last 8760 hours. Basic Metabolic Panel:  Recent Labs Lab 02/14/16 1753 02/15/16 0451 02/16/16 0459  NA 143 141 143  K 4.6 3.6 3.9  CL 107 109 110  CO2 '28 24 24  '$ GLUCOSE 104* 101* 92  BUN 28* 23* 23*  CREATININE 1.26* 0.97 0.95  CALCIUM 8.9 8.2* 8.5*   Liver Function Tests:  Recent Labs Lab 02/15/16 0451  ALBUMIN 2.7*   No results for input(s): LIPASE, AMYLASE in the last 168 hours. No results for input(s): AMMONIA in the last 168 hours. CBC:  Recent Labs Lab 02/14/16 1753 02/15/16 0451 02/16/16 0459 02/17/16 1128  WBC 36.8* 32.0* 34.0* 31.1*  NEUTROABS 6.6  --   --   --   HGB 13.0 12.1* 11.8* 11.8*  HCT 39.5 36.1* 35.1* 34.9*  MCV 95.9 95.0 93.4 95.4  PLT 276 271 284 289   Cardiac Enzymes: No results for input(s): CKTOTAL, CKMB, CKMBINDEX, TROPONINI in the last 168 hours. BNP: Invalid input(s): POCBNP CBG:  Recent Labs Lab 02/16/16 1216  GLUCAP 88   D-Dimer No results for input(s): DDIMER in the last 72 hours. Hgb A1c No results for input(s): HGBA1C in the last 72  hours. Lipid Profile No results for input(s): CHOL, HDL, LDLCALC, TRIG, CHOLHDL, LDLDIRECT in the last 72 hours. Thyroid function studies No results for input(s): TSH, T4TOTAL, T3FREE, THYROIDAB in the last 72 hours.  Invalid input(s): FREET3 Anemia work up No results for input(s): VITAMINB12, FOLATE, FERRITIN, TIBC, IRON, RETICCTPCT in the last 72 hours. Urinalysis    Component Value Date/Time   COLORURINE YELLOW 02/14/2016 2050   APPEARANCEUR HAZY (A) 02/14/2016 2050   LABSPEC 1.017 02/14/2016 2050   PHURINE 6.0 02/14/2016 2050   GLUCOSEU NEGATIVE 02/14/2016 2050   HGBUR MODERATE (A) 02/14/2016 2050   HGBUR small 07/30/2006 1538   BILIRUBINUR NEGATIVE 02/14/2016 2050   KETONESUR 5 (A) 02/14/2016 2050   PROTEINUR 30 (A) 02/14/2016 2050   UROBILINOGEN 0.2 06/01/2013 0349   NITRITE NEGATIVE 02/14/2016 2050   LEUKOCYTESUR LARGE (A) 02/14/2016 2050   Sepsis Labs Invalid input(s): PROCALCITONIN,  WBC,  LACTICIDVEN Microbiology Recent Results (from the past 240 hour(s))  Urine culture     Status: Abnormal   Collection Time: 02/14/16  8:50 PM  Result Value Ref Range Status   Specimen Description URINE, RANDOM  Final   Special Requests NONE  Final   Culture >=100,000 COLONIES/mL PROTEUS MIRABILIS (A)  Final   Report Status 02/17/2016 FINAL  Final   Organism ID, Bacteria PROTEUS MIRABILIS (A)  Final      Susceptibility   Proteus mirabilis - MIC*    AMPICILLIN <=2 SENSITIVE Sensitive     CEFAZOLIN <=4 SENSITIVE Sensitive     CEFTRIAXONE <=1 SENSITIVE Sensitive     CIPROFLOXACIN >=4 RESISTANT Resistant     GENTAMICIN <=1 SENSITIVE Sensitive     IMIPENEM 2 SENSITIVE Sensitive     NITROFURANTOIN 128 RESISTANT Resistant     TRIMETH/SULFA >=320 RESISTANT Resistant     AMPICILLIN/SULBACTAM <=2 SENSITIVE Sensitive     PIP/TAZO <=4 SENSITIVE Sensitive     * >=100,000 COLONIES/mL PROTEUS MIRABILIS     Time coordinating discharge: Over 30 minutes  SIGNED:   Elmarie Shiley,  MD  [facility] 02/17/2016, 1:23 PM Pager   If 7PM-7AM, please contact night-coverage [email or web address] Password [email or web address]

## 2016-02-18 DIAGNOSIS — S72002S Fracture of unspecified part of neck of left femur, sequela: Secondary | ICD-10-CM | POA: Diagnosis not present

## 2016-02-18 DIAGNOSIS — M170 Bilateral primary osteoarthritis of knee: Secondary | ICD-10-CM | POA: Diagnosis not present

## 2016-02-18 DIAGNOSIS — C3401 Malignant neoplasm of right main bronchus: Secondary | ICD-10-CM | POA: Diagnosis not present

## 2016-02-18 DIAGNOSIS — R1312 Dysphagia, oropharyngeal phase: Secondary | ICD-10-CM | POA: Diagnosis not present

## 2016-02-18 DIAGNOSIS — N400 Enlarged prostate without lower urinary tract symptoms: Secondary | ICD-10-CM | POA: Diagnosis not present

## 2016-02-18 DIAGNOSIS — N401 Enlarged prostate with lower urinary tract symptoms: Secondary | ICD-10-CM | POA: Diagnosis not present

## 2016-02-18 DIAGNOSIS — R488 Other symbolic dysfunctions: Secondary | ICD-10-CM | POA: Diagnosis not present

## 2016-02-18 DIAGNOSIS — D72820 Lymphocytosis (symptomatic): Secondary | ICD-10-CM | POA: Diagnosis not present

## 2016-02-18 DIAGNOSIS — G9340 Encephalopathy, unspecified: Secondary | ICD-10-CM | POA: Diagnosis not present

## 2016-02-18 DIAGNOSIS — S32592D Other specified fracture of left pubis, subsequent encounter for fracture with routine healing: Secondary | ICD-10-CM | POA: Diagnosis not present

## 2016-02-18 DIAGNOSIS — D72829 Elevated white blood cell count, unspecified: Secondary | ICD-10-CM | POA: Diagnosis not present

## 2016-02-18 DIAGNOSIS — R918 Other nonspecific abnormal finding of lung field: Secondary | ICD-10-CM | POA: Diagnosis not present

## 2016-02-18 DIAGNOSIS — C771 Secondary and unspecified malignant neoplasm of intrathoracic lymph nodes: Secondary | ICD-10-CM | POA: Diagnosis not present

## 2016-02-18 DIAGNOSIS — I495 Sick sinus syndrome: Secondary | ICD-10-CM | POA: Diagnosis not present

## 2016-02-18 DIAGNOSIS — R05 Cough: Secondary | ICD-10-CM | POA: Diagnosis not present

## 2016-02-18 DIAGNOSIS — I10 Essential (primary) hypertension: Secondary | ICD-10-CM | POA: Diagnosis not present

## 2016-02-18 DIAGNOSIS — K922 Gastrointestinal hemorrhage, unspecified: Secondary | ICD-10-CM | POA: Diagnosis not present

## 2016-02-18 DIAGNOSIS — S7002XD Contusion of left hip, subsequent encounter: Secondary | ICD-10-CM | POA: Diagnosis not present

## 2016-02-18 DIAGNOSIS — N390 Urinary tract infection, site not specified: Secondary | ICD-10-CM | POA: Diagnosis not present

## 2016-02-18 DIAGNOSIS — S32512D Fracture of superior rim of left pubis, subsequent encounter for fracture with routine healing: Secondary | ICD-10-CM | POA: Diagnosis not present

## 2016-02-18 DIAGNOSIS — W19XXXD Unspecified fall, subsequent encounter: Secondary | ICD-10-CM | POA: Diagnosis not present

## 2016-02-18 DIAGNOSIS — G250 Essential tremor: Secondary | ICD-10-CM | POA: Diagnosis not present

## 2016-02-18 DIAGNOSIS — K9181 Other intraoperative complications of digestive system: Secondary | ICD-10-CM | POA: Diagnosis not present

## 2016-02-18 DIAGNOSIS — M6281 Muscle weakness (generalized): Secondary | ICD-10-CM | POA: Diagnosis not present

## 2016-02-18 DIAGNOSIS — B964 Proteus (mirabilis) (morganii) as the cause of diseases classified elsewhere: Secondary | ICD-10-CM | POA: Diagnosis not present

## 2016-02-18 DIAGNOSIS — R1310 Dysphagia, unspecified: Secondary | ICD-10-CM | POA: Diagnosis not present

## 2016-02-18 DIAGNOSIS — N3000 Acute cystitis without hematuria: Secondary | ICD-10-CM | POA: Diagnosis not present

## 2016-02-18 DIAGNOSIS — F0280 Dementia in other diseases classified elsewhere without behavioral disturbance: Secondary | ICD-10-CM | POA: Diagnosis not present

## 2016-02-18 DIAGNOSIS — J449 Chronic obstructive pulmonary disease, unspecified: Secondary | ICD-10-CM | POA: Diagnosis not present

## 2016-02-18 DIAGNOSIS — R531 Weakness: Secondary | ICD-10-CM | POA: Diagnosis not present

## 2016-02-18 DIAGNOSIS — R634 Abnormal weight loss: Secondary | ICD-10-CM | POA: Diagnosis not present

## 2016-02-18 MED ORDER — BENZONATATE 100 MG PO CAPS: 100.0000 mg | ORAL_CAPSULE | Freq: Three times a day (TID) | ORAL | 0 refills | Status: DC | PRN

## 2016-02-18 MED ORDER — HYDROCODONE-HOMATROPINE 5-1.5 MG/5ML PO SYRP: 5.0000 mL | ORAL_SOLUTION | Freq: Once | ORAL | Status: DC

## 2016-02-18 MED ORDER — BENZONATATE 100 MG PO CAPS
100.0000 mg | ORAL_CAPSULE | Freq: Three times a day (TID) | ORAL | Status: DC | PRN
Start: 2016-02-18 — End: 2016-02-18
  Administered 2016-02-18: 100 mg via ORAL
  Filled 2016-02-18: qty 1

## 2016-02-18 MED ORDER — ALBUTEROL SULFATE (2.5 MG/3ML) 0.083% IN NEBU
2.5000 mg | INHALATION_SOLUTION | RESPIRATORY_TRACT | Status: DC | PRN
  Administered 2016-02-18: 2.5 mg via RESPIRATORY_TRACT
  Filled 2016-02-18: qty 3

## 2016-02-18 NOTE — Clinical Social Work Placement (Signed)
Medical Social Worker facilitated patient discharge including contacting patient family and facility to confirm patient discharge plans.  Clinical information faxed to facility and family agreeable with plan.  MSW arranged ambulance transport via [facility] to [place].  RN to call report prior to discharge.  Medical Social Worker will sign off for now as social work intervention is no longer needed. Please consult [place] again if new need arises.  CLINICAL SOCIAL WORK PLACEMENT  NOTE  Date:  02/18/2016  Patient Details  Name: Seth Hunter MRN: [id number] Date of Birth: Feb 15, 1918  Clinical Social Work is seeking post-discharge placement for this patient at the [place] level of care (*CSW will initial, date and re-position this form in  chart as items are completed):  Yes   Patient/family provided with [place] Work Department's list of facilities offering this level of care within the geographic area requested by the patient (or if unable, by the patient's family).  Yes   Patient/family informed of their freedom to choose among providers that offer the needed level of care, that participate in Medicare, Medicaid or managed care program needed by the patient, have an available bed and are willing to accept the patient.  Yes   Patient/family informed of [facility]'s ownership interest in [facility], as well as of the fact that they are under no obligation to receive care at these facilities.  PASRR submitted to EDS on 02/15/16     PASRR number received on       Existing PASRR number confirmed on 02/15/16     FL2 transmitted to all facilities in geographic area requested by pt/family on 02/15/16     FL2 transmitted to all facilities within larger geographic area on       Patient informed that his/her managed care company has contracts with or will negotiate with certain facilities, including the following:        Yes    Patient/family informed of bed offers received.  Patient chooses bed at  [facility] )     Physician recommends and patient chooses bed at      Patient to be transferred to  [facility] ) on 02/18/16.  Patient to be transferred to facility by  [facility] )     Patient family notified on 02/18/16 of transfer.  Name of family member notified:   (Pt's niece, [name] )     PHYSICIAN Please prepare priority discharge summary, including medications, Please sign FL2, Please sign DNR     Additional Comment:    _______________________________________________ Glendon Axe A 02/18/2016, 10:26 AM

## 2016-02-18 NOTE — Progress Notes (Signed)
Called provider on call earlier because having a nagging cough and could not sleep. Obtained order for tessalon pearls.  An hour after I administered it, pt still coughing violently and wheezing.  Notified provider again, obtaed order for albuteral breathing tx and Hycon for cough. Will continue to monitor.

## 2016-02-18 NOTE — Discharge Summary (Signed)
Physician Discharge Summary  Seth Hunter QHU:[id number] DOB: [birth year] DOA: 02/14/2016  PCP: Elsie Stain, MD  Admit date: 02/14/2016 Discharge date: 02/18/2016  Admitted From: Home  Disposition:  SNF  Recommendations for Outpatient Follow-up:  1. Follow up with PCP in 1-2 weeks 2. Please obtain BMP/CBC in one week 3. Please make sure palliative care team follows patient at SNF.    Discharge Condition: Stable.  CODE STATUS: DNR Diet recommendation: Heart Healthy  Brief/Interim Summary: Seth Hunter a 83 y.o.malewith medical history significant ofhypertension, abnormal TSH, status post pacemaker, history of lower GI bleed likely diverticular hemorrhage, COPD, HTN, BPH, Presented with unwitnessed fall. Found to have leukocytosis, UTI.    Assessment & Plan: 1-Left Hip pain; Acute nondisplaced LEFT superior and inferior pubic rami fractures. LEFT hip intramuscular hematomas. On CT scan.  Pain management.  PT therapy at SNF  Acute encephalopathy;  Patient was lethargic the morning of 1-31. , suspect related to medications (Robaxin and vicodin).  He is alert this morning. Resolved.  Avoid strong sedatives. He has been tolerating low dose tramadol.  Patient more alert. He is sitting in his bed, with eyes open, more communicative.  Stable to be discharge today   UTI;  UA with too numerous to count WBC.  Urine culture; proteus mirabile. Sensitive to ceftriaxone.  He has received total 3 days of IV ceftriaxone. Continue ceftriaxone tomorrow.  He will be discharge on omnicef for 4 days.   AKI; improved with IV fluids.   Chronic leukocytosis; worsening probably related to infection. Follow trend.  No further evaluation per family wishes.  WBC trending down.  repeat labs in 1 week.  Patient afebrile, on IV antibiotics.   BPH; continue with Proscar.   HTN; hold diuretics at this time due to AKI. Cr normalized.   Constipation; Bowel regimen.   Discharge Diagnoses:   Active Problems:   Essential hypertension   BPH (benign prostatic hyperplasia)   Lung mass   Leukocytosis   UTI (urinary tract infection)   Acute encephalopathy   Fall   Pelvic fracture [facility])    Discharge Instructions  Discharge Instructions    Diet - low sodium heart healthy    Complete by:  As directed    Diet - low sodium heart healthy    Complete by:  As directed    Increase activity slowly    Complete by:  As directed    Increase activity slowly    Complete by:  As directed      Allergies as of 02/18/2016      Reactions   Coricidin Hbp Cough-cold [chlorpheniramine-dm] Other (See Comments)   Sedation.    Penicillins Other (See Comments)   Has patient had a PCN reaction causing immediate rash, facial/tongue/throat swelling, SOB or lightheadedness with hypotension: No Has patient had a PCN reaction causing severe rash involving mucus membranes or skin necrosis: No Has patient had a PCN reaction that required hospitalization No Has patient had a PCN reaction occurring within the last 10 years: No If all of the above answers are "NO", then may proceed with Cephalosporin use.      Medication List    STOP taking these medications   hydrochlorothiazide 12.5 MG capsule Commonly known as:  MICROZIDE     TAKE these medications   benzonatate 100 MG capsule Commonly known as:  TESSALON Take 1 capsule (100 mg total) by mouth 3 (three) times daily as needed for cough.   BIOFREEZE 4 % Gel Generic drug:  Menthol (Topical Analgesic) Apply 1 application topically daily as needed. Applied to knees for arthritis   bisacodyl 10 MG suppository Commonly known as:  DULCOLAX Place 1 suppository (10 mg total) rectally daily as needed for moderate constipation.   cefdinir 300 MG capsule Commonly known as:  OMNICEF Take 1 capsule (300 mg total) by mouth 2 (two) times daily.   ciprofloxacin 0.3 % ophthalmic solution Commonly known as:  CILOXAN Place 1 drop into the left eye  every 4 (four) hours while awake. Administer 1 drop, every 2 hours, while awake, for 2 days. Then 1 drop, every 4 hours, while awake, for the next 5 days.   docusate sodium 100 MG capsule Commonly known as:  COLACE Take 1 capsule (100 mg total) by mouth daily.   feeding supplement (ENSURE ENLIVE) Liqd Take 237 mLs by mouth 2 (two) times daily between meals.   finasteride 5 MG tablet Commonly known as:  PROSCAR TAKE 1 TABLET BY MOUTH DAILY   Garlic 10 MG Caps Take 1 capsule by mouth daily.   senna 8.6 MG Tabs tablet Commonly known as:  SENOKOT Take 1 tablet (8.6 mg total) by mouth 2 (two) times daily.   simethicone 80 MG chewable tablet Commonly known as:  MYLICON Chew 80 mg by mouth every 6 (six) hours as needed for flatulence.   traMADol 50 MG tablet Commonly known as:  ULTRAM Take 0.5 tablets (25 mg total) by mouth every 6 (six) hours as needed for moderate pain.   Vitamin D (Ergocalciferol) 50000 units Caps capsule Commonly known as:  DRISDOL Take 1 capsule (50,000 Units total) by mouth every 7 (seven) days. Start taking on:  02/23/2016       Allergies  Allergen Reactions  . Coricidin Hbp Cough-Cold [Chlorpheniramine-Dm] Other (See Comments)    Sedation.   Marland Kitchen Penicillins Other (See Comments)    Has patient had a PCN reaction causing immediate rash, facial/tongue/throat swelling, SOB or lightheadedness with hypotension: No Has patient had a PCN reaction causing severe rash involving mucus membranes or skin necrosis: No Has patient had a PCN reaction that required hospitalization No Has patient had a PCN reaction occurring within the last 10 years: No If all of the above answers are "NO", then may proceed with Cephalosporin use.    Consultations:  none   Procedures/Studies: Dg Chest 2 View  Result Date: 02/14/2016 CLINICAL DATA:  Weakness.  Fall today. EXAM: CHEST  2 VIEW COMPARISON:  01/07/2013 FINDINGS: Normal heart size. Double lead left subclavian pacemaker  device and leads are stable and intact. Tips are in the right atrium and right ventricle. No pneumothorax or pleural effusion. Lungs are very under aerated and there is scattered atelectasis. IMPRESSION: Low volumes and scattered atelectasis. Electronically Signed   By: Marybelle Killings M.D.   On: 02/14/2016 19:50   Ct Head Wo Contrast  Result Date: 02/14/2016 CLINICAL DATA:  Acute encephalopathy. EXAM: CT HEAD WITHOUT CONTRAST TECHNIQUE: Contiguous axial images were obtained from the base of the skull through the vertex without intravenous contrast. COMPARISON:  09/10/2011 FINDINGS: Brain: There is no intracranial hemorrhage, mass or evidence of acute infarction. There is moderate generalized atrophy. There is moderate chronic microvascular ischemic change. There is no significant extra-axial fluid collection. No acute intracranial findings are evident. Vascular: No hyperdense vessel or unexpected calcification. Skull: Normal. Negative for fracture or focal lesion. Sinuses/Orbits: No acute finding. Other: None. IMPRESSION: No acute intracranial findings. There is moderate generalized atrophy and chronic appearing white matter hypodensities  which likely represent small vessel ischemic disease. Electronically Signed   By: Andreas Newport M.D.   On: 02/14/2016 23:45   Ct Femur Left Wo Contrast  Result Date: 02/15/2016 CLINICAL DATA:  LEFT hip pain after fall 3 days ago. Altered mental status, history of dementia. EXAM: CT OF THE LOWER LEFT EXTREMITY WITHOUT CONTRAST TECHNIQUE: Multidetector CT imaging of the lower left extremity was performed according to the standard protocol. COMPARISON:  LEFT hip radiographs February 14, 2016 at 1800 hours. FINDINGS: Bones/Joint/Cartilage Acute nondisplaced LEFT superior and inferior pubic rami fractures extending to the pubis. LEFT femur is intact with intramedullary rod and screw fixation, hardware is intact. Old LEFT intertrochanteric fracture. No dislocation. No  periprosthetic lucency. Osteopenia. Severe degenerative change of the LEFT knee. Ligaments Suboptimally assessed by CT. Muscles and Tendons LEFT gluteal hematoma. Thickened LEFT pectineus and adductor/ obturator externus muscles hematoma. No subcutaneous gas or radiopaque foreign bodies. Mild atherosclerosis. Soft tissues Small fat containing LEFT inguinal hernia. Partially imaged prostatomegaly. IMPRESSION: Acute nondisplaced LEFT superior and inferior pubic rami fractures. LEFT hip intramuscular hematomas. LEFT femur ORIF without hardware failure. Osteopenia. Electronically Signed   By: Elon Alas M.D.   On: 02/15/2016 00:11   Dg Hip Unilat With Pelvis 2-3 Views Left  Result Date: 02/14/2016 CLINICAL DATA:  Fall EXAM: DG HIP (WITH OR WITHOUT PELVIS) 2-3V LEFT COMPARISON:  None. FINDINGS: Dynamic compression screw and rod are present in the proximal left femur. No acute fracture. No dislocation. Osteopenia. Degenerative changes in the lower lumbar spine. No breakage or loosening of the hardware. IMPRESSION: No acute bony pathology. Chronic and postoperative changes are noted. Electronically Signed   By: Marybelle Killings M.D.   On: 02/14/2016 18:13    Subjective: Patient alert, he wake up answer questions. He has been eating more.   Discharge Exam: Vitals:   02/18/16 0101 02/18/16 0529  BP: 135/73 129/66  Pulse: 71 73  Resp: 18 18  Temp: 97.6 F (36.4 C) 98.4 F (36.9 C)   Vitals:   02/17/16 2123 02/18/16 0101 02/18/16 0254 02/18/16 0529  BP: 127/76 135/73  129/66  Pulse: 77 71  73  Resp: '18 18  18  '$ Temp: 98 F (36.7 C) 97.6 F (36.4 C)  98.4 F (36.9 C)  TempSrc: Oral Oral  Oral  SpO2: 98% 96% 95% 97%  Weight:      Height:        General: Pt is alert in no distress Cardiovascular: RRR, S1/S2 +, no rubs, no gallops Respiratory: CTA bilaterally, no wheezing, no rhonchi  Abdominal: Soft, NT, ND, bowel sounds + Extremities: no edema, no cyanosis    The results of  significant diagnostics from this hospitalization (including imaging, microbiology, ancillary and laboratory) are listed below for reference.     Microbiology: Recent Results (from the past 240 hour(s))  Urine culture     Status: Abnormal   Collection Time: 02/14/16  8:50 PM  Result Value Ref Range Status   Specimen Description URINE, RANDOM  Final   Special Requests NONE  Final   Culture >=100,000 COLONIES/mL PROTEUS MIRABILIS (A)  Final   Report Status 02/17/2016 FINAL  Final   Organism ID, Bacteria PROTEUS MIRABILIS (A)  Final      Susceptibility   Proteus mirabilis - MIC*    AMPICILLIN <=2 SENSITIVE Sensitive     CEFAZOLIN <=4 SENSITIVE Sensitive     CEFTRIAXONE <=1 SENSITIVE Sensitive     CIPROFLOXACIN >=4 RESISTANT Resistant     GENTAMICIN <=  1 SENSITIVE Sensitive     IMIPENEM 2 SENSITIVE Sensitive     NITROFURANTOIN 128 RESISTANT Resistant     TRIMETH/SULFA >=320 RESISTANT Resistant     AMPICILLIN/SULBACTAM <=2 SENSITIVE Sensitive     PIP/TAZO <=4 SENSITIVE Sensitive     * >=100,000 COLONIES/mL PROTEUS MIRABILIS     Labs: BNP (last 3 results) No results for input(s): BNP in the last 8760 hours. Basic Metabolic Panel:  Recent Labs Lab 02/14/16 1753 02/15/16 0451 02/16/16 0459  NA 143 141 143  K 4.6 3.6 3.9  CL 107 109 110  CO2 '28 24 24  '$ GLUCOSE 104* 101* 92  BUN 28* 23* 23*  CREATININE 1.26* 0.97 0.95  CALCIUM 8.9 8.2* 8.5*   Liver Function Tests:  Recent Labs Lab 02/15/16 0451  ALBUMIN 2.7*   No results for input(s): LIPASE, AMYLASE in the last 168 hours. No results for input(s): AMMONIA in the last 168 hours. CBC:  Recent Labs Lab 02/14/16 1753 02/15/16 0451 02/16/16 0459 02/17/16 1128  WBC 36.8* 32.0* 34.0* 31.1*  NEUTROABS 6.6  --   --   --   HGB 13.0 12.1* 11.8* 11.8*  HCT 39.5 36.1* 35.1* 34.9*  MCV 95.9 95.0 93.4 95.4  PLT 276 271 284 289   Cardiac Enzymes: No results for input(s): CKTOTAL, CKMB, CKMBINDEX, TROPONINI in the last 168  hours. BNP: Invalid input(s): POCBNP CBG:  Recent Labs Lab 02/16/16 1216  GLUCAP 88   D-Dimer No results for input(s): DDIMER in the last 72 hours. Hgb A1c No results for input(s): HGBA1C in the last 72 hours. Lipid Profile No results for input(s): CHOL, HDL, LDLCALC, TRIG, CHOLHDL, LDLDIRECT in the last 72 hours. Thyroid function studies No results for input(s): TSH, T4TOTAL, T3FREE, THYROIDAB in the last 72 hours.  Invalid input(s): FREET3 Anemia work up No results for input(s): VITAMINB12, FOLATE, FERRITIN, TIBC, IRON, RETICCTPCT in the last 72 hours. Urinalysis    Component Value Date/Time   COLORURINE YELLOW 02/14/2016 2050   APPEARANCEUR HAZY (A) 02/14/2016 2050   LABSPEC 1.017 02/14/2016 2050   PHURINE 6.0 02/14/2016 2050   GLUCOSEU NEGATIVE 02/14/2016 2050   HGBUR MODERATE (A) 02/14/2016 2050   HGBUR small 07/30/2006 1538   BILIRUBINUR NEGATIVE 02/14/2016 2050   KETONESUR 5 (A) 02/14/2016 2050   PROTEINUR 30 (A) 02/14/2016 2050   UROBILINOGEN 0.2 06/01/2013 0349   NITRITE NEGATIVE 02/14/2016 2050   LEUKOCYTESUR LARGE (A) 02/14/2016 2050   Sepsis Labs Invalid input(s): PROCALCITONIN,  WBC,  LACTICIDVEN Microbiology Recent Results (from the past 240 hour(s))  Urine culture     Status: Abnormal   Collection Time: 02/14/16  8:50 PM  Result Value Ref Range Status   Specimen Description URINE, RANDOM  Final   Special Requests NONE  Final   Culture >=100,000 COLONIES/mL PROTEUS MIRABILIS (A)  Final   Report Status 02/17/2016 FINAL  Final   Organism ID, Bacteria PROTEUS MIRABILIS (A)  Final      Susceptibility   Proteus mirabilis - MIC*    AMPICILLIN <=2 SENSITIVE Sensitive     CEFAZOLIN <=4 SENSITIVE Sensitive     CEFTRIAXONE <=1 SENSITIVE Sensitive     CIPROFLOXACIN >=4 RESISTANT Resistant     GENTAMICIN <=1 SENSITIVE Sensitive     IMIPENEM 2 SENSITIVE Sensitive     NITROFURANTOIN 128 RESISTANT Resistant     TRIMETH/SULFA >=320 RESISTANT Resistant      AMPICILLIN/SULBACTAM <=2 SENSITIVE Sensitive     PIP/TAZO <=4 SENSITIVE Sensitive     * >=  100,000 COLONIES/mL PROTEUS MIRABILIS     Time coordinating discharge: Over 30 minutes  SIGNED:   Elmarie Shiley, MD  [facility] 02/18/2016, 9:56 AM Pager   If 7PM-7AM, please contact night-coverage [email or web address] Password [email or web address]

## 2016-02-18 NOTE — Progress Notes (Signed)
Pt not coughing since he had his breathing rx. He is finally resting.

## 2016-02-22 ENCOUNTER — Encounter: Payer: Self-pay | Admitting: Internal Medicine

## 2016-02-22 ENCOUNTER — Non-Acute Institutional Stay (SKILLED_NURSING_FACILITY): Payer: Medicare Other | Admitting: Internal Medicine

## 2016-02-22 DIAGNOSIS — N3000 Acute cystitis without hematuria: Secondary | ICD-10-CM | POA: Diagnosis not present

## 2016-02-22 DIAGNOSIS — W19XXXD Unspecified fall, subsequent encounter: Secondary | ICD-10-CM

## 2016-02-22 DIAGNOSIS — G9340 Encephalopathy, unspecified: Secondary | ICD-10-CM

## 2016-02-22 NOTE — Assessment & Plan Note (Addendum)
In the context of Proteus  urinary tract infection and polypharmacy with pain medicines and muscle relaxants Improvement with weaning of medications Residual dementia suggested; unlikely to benefit from Aricept or Namenda

## 2016-02-22 NOTE — Assessment & Plan Note (Signed)
PT/OT at SNF °

## 2016-02-22 NOTE — Assessment & Plan Note (Signed)
to complete oral Omnicef

## 2016-02-22 NOTE — Progress Notes (Signed)
    Facility Location: [facility]  Room Number: 818   Code Status: DNR  PCP: Elsie Stain, MD [place] [id number]  This is a comprehensive admission note to [facility] performed on this date less than 30 days from date of admission. Included are preadmission medical/surgical history;reconciled medication list; family history; social history and comprehensive review of systems.  Corrections and additions to the records were documented . Comprehensive physical exam was also performed. Additionally a clinical summary was entered for each active diagnosis pertinent to this admission in the Problem List to enhance continuity of care.  HPI: He was hospitalized 1/29-02/18/16 having sustained a nondisplaced left superior and inferior pubic rami fracture in an unwitnessed fall.  Findings included Proteus mirabilis urinary tract infection with associated leukocytosis. He also had acute kidney injury which improved with IV fluids. Diuretics were held. Creatinine did normalize. Hospitalization was complicated by lethargy attributed to medications. Medications were adjusted and he was discharged on low-dose tramadol and Omnicef for 4 additional days. Discharge diagnoses include "lung mass". Chest x-ray 02/14/16 revealed low volumes and scattered atelectasis without mention of any pulmonary mass.  Past medical and surgical history: Includes hypertension, abnormal thyroid function tests, diverticular hemorrhage, COPD, and BPH. Surgeries include pacemaker insertion and cholecystectomy.  Social history: Reviewed  Family history: Reviewed, noncontributory  Review of systems:Could not be completed due to dementia. Date given as 11/27/20. He did describe eyes as "having gravel". He states he feels "rough" but cannot explain what he means.  Details of the fall obviously are unknown. He appears to deny any cardiac or neurologic prodrome.  Physical  exam:  Pertinent or positive findings: he is profoundly hard of hearing. Pattern alopecia is present. He has temporal wasting. He has intermittent bobbing tremor of the head.  mandible is edentulous. He exhibits a stammering speech pattern. The left lens is cloudy.  Heart sounds are distant. Breath sounds are also decreased. He has coarse tremor of left upper extremity as well as tremor of the right hand intermittently. Pedal pulses are decreased. He has trace edema. Abrasions over the right shin are dressed. He has medial deviation of the DIP joints of the index and second and third fingers of the right hand.   General appearance:Adequately nourished; no acute distress , increased work of breathing is present.   Lymphatic: No lymphadenopathy about the head, neck, axilla . Eyes: No conjunctival inflammation or lid edema is present. There is no scleral icterus. Ears:  External ear exam shows no significant lesions or deformities.   Nose:  External nasal examination shows no deformity or inflammation. Nasal mucosa are pink and moist without lesions ,exudates Oral exam: lips and gums are healthy appearing.There is no oropharyngeal erythema or exudate . Neck:  No thyromegaly, masses, tenderness noted.    Heart:  Normal rate and regular rhythm. S1 and S2 normal without gallop, click, rub .  Lungs:Chest clear to auscultation without wheezes, rhonchi,rales , rubs. Abdomen:Bowel sounds are normal. Abdomen is soft and nontender with no organomegaly, hernias,masses. GU: deferred  Extremities:  No cyanosis, clubbing Neurologic exam : Balance,Rhomberg,finger to nose testing could not be completed due to clinical state Skin: Warm & dry w/o tenting. No significant  rash.  See clinical summary under each active problem in the Problem List with associated updated therapeutic plan

## 2016-02-22 NOTE — Patient Instructions (Signed)
See assessment and plan under each diagnosis in the problem list and acutely for this visit 

## 2016-02-23 LAB — TSH: TSH: 5.35 u[IU]/mL (ref <=5.90)

## 2016-02-24 ENCOUNTER — Encounter: Payer: Self-pay | Admitting: *Deleted

## 2016-03-01 ENCOUNTER — Other Ambulatory Visit: Payer: Self-pay | Admitting: *Deleted

## 2016-03-01 NOTE — Patient Outreach (Signed)
Spoke with Cameron Ali, SW at facility regarding patient discharge planning needs. At this time, patient is scheduled to go home next week and plan is to elect Hospice services.   Plan to sign off as patient will have Hospice benefit. Reminded SW that [facility] can be consulted again if patient discharge planning needs change and patient needs [facility] care management services.  Royetta Crochet. Laymond Purser, RN, BSN, [place] [phone]) Business Cell  [phone]) Toll Free Office

## 2016-03-02 DIAGNOSIS — R531 Weakness: Secondary | ICD-10-CM | POA: Diagnosis not present

## 2016-03-07 ENCOUNTER — Encounter: Payer: Self-pay | Admitting: Nurse Practitioner

## 2016-03-07 ENCOUNTER — Non-Acute Institutional Stay (SKILLED_NURSING_FACILITY): Payer: Medicare Other | Admitting: Nurse Practitioner

## 2016-03-07 DIAGNOSIS — W19XXXD Unspecified fall, subsequent encounter: Secondary | ICD-10-CM

## 2016-03-07 DIAGNOSIS — N400 Enlarged prostate without lower urinary tract symptoms: Secondary | ICD-10-CM

## 2016-03-07 DIAGNOSIS — D72820 Lymphocytosis (symptomatic): Secondary | ICD-10-CM | POA: Diagnosis not present

## 2016-03-07 DIAGNOSIS — R918 Other nonspecific abnormal finding of lung field: Secondary | ICD-10-CM | POA: Diagnosis not present

## 2016-03-07 DIAGNOSIS — S72002S Fracture of unspecified part of neck of left femur, sequela: Secondary | ICD-10-CM

## 2016-03-07 MED ORDER — VITAMIN D (ERGOCALCIFEROL) 1.25 MG (50000 UNIT) PO CAPS: 50000.0000 [IU] | ORAL_CAPSULE | ORAL | 0 refills | Status: DC

## 2016-03-07 MED ORDER — BENZONATATE 100 MG PO CAPS: 100.0000 mg | ORAL_CAPSULE | Freq: Three times a day (TID) | ORAL | 0 refills | Status: DC | PRN

## 2016-03-07 MED ORDER — FINASTERIDE 5 MG PO TABS: 5.0000 mg | ORAL_TABLET | Freq: Every day | ORAL | 0 refills | Status: DC

## 2016-03-07 NOTE — Progress Notes (Signed)
Nursing Home Location:  [facility] Room: [place] of Service: SNF [phone]  PCP: Elsie Stain, MD   Code Status: DNR  Allergies  Allergen Reactions  . Coricidin Hbp Cough-Cold [Chlorpheniramine-Dm] Other (See Comments)    Sedation.   Marland Kitchen Penicillins Other (See Comments)    Has patient had a PCN reaction causing immediate rash, facial/tongue/throat swelling, SOB or lightheadedness with hypotension: No Has patient had a PCN reaction causing severe rash involving mucus membranes or skin necrosis: No Has patient had a PCN reaction that required hospitalization No Has patient had a PCN reaction occurring within the last 10 years: No If all of the above answers are "NO", then may proceed with Cephalosporin use.    Chief Complaint  Patient presents with  . Discharge Note    HPI:  Patient is a 83 y.o. male seen today at [facility] for discharge home with hospice. Pt with hx of lung mass, hypertension, abnormal thyroid function tests, diverticular hemorrhage, COPD, and BPH. He was hospitalized 1/29-02/18/16 having sustained a nondisplaced left superior and inferior pubic rami fracture in an unwitnessed fall. He also was found to have Proteus mirabilis urinary tract infection with associated leukocytosis, acute kidney injury which improved with IV fluids. Hospitalization was complicated by lethargy attributed to medications. Medications were adjusted and he was discharged on low-dose tramadol and Omnicef for 4 additional days. Pt was diagnosed with lung mass in 2016 with what appeared to have mets to lymph nodes, he saw oncology and per PCP notes decided not to have additional testing, follow up or treatment of lung mass.  HPI and ROS limited due to memory deficit and severely [facility]  Patient currently doing well with therapy, now stable to discharge home with hospice services.   Review of Systems:  Review of Systems  Unable to perform ROS: Dementia    Past Medical  History:  Diagnosis Date  . Anemia 04/1995   Post op, 2nd to hemorrhage, left leg  . Atrioventricular block, complete ([place])   . BPH (benign prostatic hyperplasia)   . COPD (chronic obstructive pulmonary disease) ([place]) 04/1995   X-ray  . Dementia    thought this appears to be mild based on reports of family as of 8/11  . DJD (degenerative joint disease) 04/1995   Both shoulders, severe  . DJD (degenerative joint disease), lumbar 04/1995   Spine, severe.   X-ray  . Elevated TSH    Mildly elevated TSH  . GIB (gastrointestinal bleeding)    presumed diverticular bleed, 02/2014 admission  . Hip fracture ([place])   . HOH (hard of hearing)   . Hypertension   . Inguinal hernia   . Pacemaker -MDT   . Pneumonia 04/1995   Bilateral upper lobes  . Syncope    Syncope in the setting of complete heart block status post permanent pacemaker placement on September 27, 2007  . Tremor   . UTI (urinary tract infection) 05/1995   Past Surgical History:  Procedure Laterality Date  . CHOLECYSTECTOMY  1988  . 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 CT Angio Chest  09/27/07   No PE, No acute prob  . FEMUR IM NAIL Left 10/21/2012   Procedure: INTRAMEDULLARY (IM) NAIL FEMORAL;  Surgeon: Meredith Pel, MD;  Location: [facility]S;  Service: Orthopedics;  Laterality: Left;  . FRACTURE SURGERY  2014   L femur  . Fractured left wrist  33   with old  right posterior rib fracture  . Hematoma, left leg  05/1995   Probably 2nd to anticoag  . HERNIA REPAIR  Years ago   Left inguinal  . PACEMAKER INSERTION    . Pacer Placement (Dr. Lovena Le)  09/27/2007  . PULMONARY EMBOLISM SURGERY  04/1995   Post-op  . 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 Syncope Comp Heart Block  9/10- 09/30/2007   HOSP Perm Pacer placed, HTN, dementia, mildly elevated TSH  . VENTRAL HERNIA REPAIR  04/1995   with intra abdominal adhesions, lysis incidental appendectomy   Social  History:   reports that he has never smoked. He has never used smokeless tobacco. He reports that he does not drink alcohol or use drugs.  Family History  Problem Relation Age of Onset  . Heart disease Father     Hardening of the arteries, heart trouble    Medications: Patient's Medications  New Prescriptions   No medications on file  Previous Medications   BENZONATATE (TESSALON) 100 MG CAPSULE    Take 1 capsule (100 mg total) by mouth 3 (three) times daily as needed for cough.   BISACODYL (DULCOLAX) 10 MG SUPPOSITORY    Place 1 suppository (10 mg total) rectally daily as needed for moderate constipation.   DOCUSATE SODIUM (COLACE) 100 MG CAPSULE    Take 1 capsule (100 mg total) by mouth daily.   FINASTERIDE (PROSCAR) 5 MG TABLET    TAKE 1 TABLET BY MOUTH DAILY   MENTHOL, TOPICAL ANALGESIC, (BIOFREEZE) 4 % GEL    Apply 1 application topically daily as needed. Applied to knees for arthritis   SENNA (SENOKOT) 8.6 MG TABS TABLET    Take 1 tablet (8.6 mg total) by mouth 2 (two) times daily.   SIMETHICONE (MYLICON) 80 MG CHEWABLE TABLET    Chew 80 mg by mouth every 6 (six) hours as needed for flatulence.   TRAMADOL (ULTRAM) 50 MG TABLET    Take 0.5 tablets (25 mg total) by mouth every 6 (six) hours as needed for moderate pain.   UNABLE TO FIND    Med Name: Med Pass 120 ml by mouth twice daily   VITAMIN D, ERGOCALCIFEROL, (DRISDOL) 50000 UNITS CAPS CAPSULE    Take 1 capsule (50,000 Units total) by mouth every 7 (seven) days.  Modified Medications   No medications on file  Discontinued Medications   CIPROFLOXACIN (CILOXAN) 0.3 % OPHTHALMIC SOLUTION    Place 1 drop into the left eye every 4 (four) hours while awake. Administer 1 drop, every 2 hours, while awake, for 2 days. Then 1 drop, every 4 hours, while awake, for the next 5 days.   GARLIC 10 MG CAPS    Take 1 capsule by mouth daily.     Physical Exam: Vitals:   03/07/16 1051  BP: 109/60  Pulse: 81  Resp: 20  Temp: 97.7 F (36.5 C)   SpO2: 98%  Weight: 172 lb (78 kg)  Height: 6' (1.829 m)    Physical Exam  Constitutional: No distress.  Frail elderly male, NAD  HENT:  Head: Normocephalic and atraumatic.  Mouth/Throat: Oropharynx is clear and moist. No oropharyngeal exudate.  Eyes: Conjunctivae and EOM are normal. Pupils are equal, round, and reactive to light.  Neck: Normal range of motion. Neck supple.  Cardiovascular: Normal rate, regular rhythm and normal heart sounds.   Pulmonary/Chest: Effort normal.  Diminished BS  Abdominal: Soft. Bowel sounds are normal. He exhibits no distension.  Musculoskeletal:  He exhibits no edema or tenderness.  Neurological: He is alert.  Skin: Skin is warm and dry. He is not diaphoretic.  Psychiatric: He has a normal mood and affect.    Labs reviewed: Basic Metabolic Panel:  Recent Labs  02/14/16 1753 02/15/16 0451 02/16/16 0459  NA 143 141 143  K 4.6 3.6 3.9  CL 107 109 110  CO2 '28 24 24  '$ GLUCOSE 104* 101* 92  BUN 28* 23* 23*  CREATININE 1.26* 0.97 0.95  CALCIUM 8.9 8.2* 8.5*   Liver Function Tests:  Recent Labs  02/15/16 0451  ALBUMIN 2.7*   No results for input(s): LIPASE, AMYLASE in the last 8760 hours. No results for input(s): AMMONIA in the last 8760 hours. CBC:  Recent Labs  02/14/16 1753 02/15/16 0451 02/16/16 0459 02/17/16 1128  WBC 36.8* 32.0* 34.0* 31.1*  NEUTROABS 6.6  --   --   --   HGB 13.0 12.1* 11.8* 11.8*  HCT 39.5 36.1* 35.1* 34.9*  MCV 95.9 95.0 93.4 95.4  PLT 276 271 284 289   TSH:  Recent Labs  02/23/16  TSH 5.35   A1C: Lab Results  Component Value Date   HGBA1C  09/27/2007    5.7 (NOTE)   The ADA recommends the following therapeutic goal for glycemic   control related to Hgb A1C measurement:   Goal of Therapy:   < 7.0% Hgb A1C   Reference: American Diabetes Association: Clinical Practice   Recommendations 2008, Diabetes Care,  2008, 31:(Suppl 1).   Lipid Panel: No results for input(s): CHOL, HDL, LDLCALC, TRIG,  CHOLHDL, LDLDIRECT in the last 8760 hours.  Radiological Exams: Dg Chest 2 View  Result Date: 02/14/2016 CLINICAL DATA:  Weakness.  Fall today. EXAM: CHEST  2 VIEW COMPARISON:  01/07/2013 FINDINGS: Normal heart size. Double lead left subclavian pacemaker device and leads are stable and intact. Tips are in the right atrium and right ventricle. No pneumothorax or pleural effusion. Lungs are very under aerated and there is scattered atelectasis. IMPRESSION: Low volumes and scattered atelectasis. Electronically Signed   By: Marybelle Killings M.D.   On: 02/14/2016 19:50   Ct Head Wo Contrast  Result Date: 02/14/2016 CLINICAL DATA:  Acute encephalopathy. EXAM: CT HEAD WITHOUT CONTRAST TECHNIQUE: Contiguous axial images were obtained from the base of the skull through the vertex without intravenous contrast. COMPARISON:  09/10/2011 FINDINGS: Brain: There is no intracranial hemorrhage, mass or evidence of acute infarction. There is moderate generalized atrophy. There is moderate chronic microvascular ischemic change. There is no significant extra-axial fluid collection. No acute intracranial findings are evident. Vascular: No hyperdense vessel or unexpected calcification. Skull: Normal. Negative for fracture or focal lesion. Sinuses/Orbits: No acute finding. Other: None. IMPRESSION: No acute intracranial findings. There is moderate generalized atrophy and chronic appearing white matter hypodensities which likely represent small vessel ischemic disease. Electronically Signed   By: Andreas Newport M.D.   On: 02/14/2016 23:45   Ct Femur Left Wo Contrast  Result Date: 02/15/2016 CLINICAL DATA:  LEFT hip pain after fall 3 days ago. Altered mental status, history of dementia. EXAM: CT OF THE LOWER LEFT EXTREMITY WITHOUT CONTRAST TECHNIQUE: Multidetector CT imaging of the lower left extremity was performed according to the standard protocol. COMPARISON:  LEFT hip radiographs February 14, 2016 at 1800 hours. FINDINGS:  Bones/Joint/Cartilage Acute nondisplaced LEFT superior and inferior pubic rami fractures extending to the pubis. LEFT femur is intact with intramedullary rod and screw fixation, hardware is intact. Old LEFT intertrochanteric fracture. No  dislocation. No periprosthetic lucency. Osteopenia. Severe degenerative change of the LEFT knee. Ligaments Suboptimally assessed by CT. Muscles and Tendons LEFT gluteal hematoma. Thickened LEFT pectineus and adductor/ obturator externus muscles hematoma. No subcutaneous gas or radiopaque foreign bodies. Mild atherosclerosis. Soft tissues Small fat containing LEFT inguinal hernia. Partially imaged prostatomegaly. IMPRESSION: Acute nondisplaced LEFT superior and inferior pubic rami fractures. LEFT hip intramuscular hematomas. LEFT femur ORIF without hardware failure. Osteopenia. Electronically Signed   By: Elon Alas M.D.   On: 02/15/2016 00:11   Dg Hip Unilat With Pelvis 2-3 Views Left  Result Date: 02/14/2016 CLINICAL DATA:  Fall EXAM: DG HIP (WITH OR WITHOUT PELVIS) 2-3V LEFT COMPARISON:  None. FINDINGS: Dynamic compression screw and rod are present in the proximal left femur. No acute fracture. No dislocation. Osteopenia. Degenerative changes in the lower lumbar spine. No breakage or loosening of the hardware. IMPRESSION: No acute bony pathology. Chronic and postoperative changes are noted. Electronically Signed   By: Marybelle Killings M.D.   On: 02/14/2016 18:13    Assessment/Plan 1. Fall, subsequent encounter 2. Closed fracture of left hip, sequela Pain controlled, cont on medication for constipation as needed  conts on tramadol PRN  3. Lymphocytosis Completed PO omnicef for UTI, WBC worsened due to UTI and were trending down, hospital records report family does not wish to further evaluate. Pt afebrile.    4. Lung mass  Found in 2016, no further workup or treatment given per family and pts wishes.   5. Benign prostatic hyperplasia without lower urinary  tract symptoms conts on proscar  pt is stable for discharge with hospice services.   Carlos American. Harle Battiest  [facility] [phone] 8 am - 5 pm) [phone] (after hours)

## 2016-03-09 ENCOUNTER — Telehealth: Payer: Self-pay

## 2016-03-09 DIAGNOSIS — R531 Weakness: Secondary | ICD-10-CM | POA: Diagnosis not present

## 2016-03-09 NOTE — Telephone Encounter (Signed)
Yvette with [facility] left v/m; pt is being discharged to home today from rehab. Dewaine Oats needs confirmation that Dr Damita Dunnings will be attending physician for pt and does Dr Damita Dunnings want to activate hospice standing orders. Request cb ASAP.

## 2016-03-09 NOTE — Telephone Encounter (Signed)
Just getting to this now. Called [facility] and left message with on call service - Dr Damita Dunnings will be attending, ok to activate hospice standing orders.  plz call [facility] tomorrow re this.

## 2016-03-09 NOTE — Telephone Encounter (Signed)
Yes, please give the order.  Thanks.

## 2016-03-10 NOTE — Telephone Encounter (Signed)
Yvette notified. They will be going to see patient tomorrow.

## 2016-03-13 ENCOUNTER — Ambulatory Visit: Payer: Medicare Other | Admitting: Family Medicine

## 2016-03-14 ENCOUNTER — Telehealth: Payer: Self-pay | Admitting: Family Medicine

## 2016-03-14 NOTE — Telephone Encounter (Signed)
LVM for pt to call back and schedule AWV + labs with Lesia and CPE with PCP. °

## 2016-03-16 ENCOUNTER — Telehealth: Payer: Self-pay

## 2016-03-16 MED ORDER — CIPROFLOXACIN HCL 500 MG PO TABS: 500.0000 mg | ORAL_TABLET | Freq: Two times a day (BID) | ORAL | 0 refills | Status: DC

## 2016-03-16 NOTE — Telephone Encounter (Signed)
Renee notified Rx sent and advise of Dr. Josefine Class comments. Joseph Art will try to get a urine sample for C&S

## 2016-03-16 NOTE — Telephone Encounter (Signed)
Agreed, sent rx for cipro.  If they can get a reliable urine sample to culture before the abx are started, then please get C&S.  If they can't, that's okay.  I wouldn't delay starting cipro.  Thanks. Please have them update me as needed.

## 2016-03-16 NOTE — Telephone Encounter (Signed)
Seth Hunter with [facility] left v/m; Seth Hunter has seen pt and spoken with niece also. Seth Hunter thinks pt has UTI, pt is hallucinating, urine is dark and pt is incontinent. Seth Hunter request Cipro to be started. Seth Hunter thinks if cath pt today will not get culture back in time to treat before weekend. Seth Hunter request cb.

## 2016-03-18 ENCOUNTER — Encounter (HOSPITAL_COMMUNITY): Payer: Self-pay | Admitting: Emergency Medicine

## 2016-03-18 ENCOUNTER — Inpatient Hospital Stay (HOSPITAL_COMMUNITY)
Admission: EM | Admit: 2016-03-18 | Discharge: 2016-03-20 | DRG: 193 | Disposition: A | Discharge status: Hospice | Attending: Internal Medicine | Admitting: Internal Medicine

## 2016-03-18 ENCOUNTER — Emergency Department (HOSPITAL_COMMUNITY)

## 2016-03-18 DIAGNOSIS — C3490 Malignant neoplasm of unspecified part of unspecified bronchus or lung: Secondary | ICD-10-CM | POA: Diagnosis present

## 2016-03-18 DIAGNOSIS — Z515 Encounter for palliative care: Secondary | ICD-10-CM | POA: Diagnosis present

## 2016-03-18 DIAGNOSIS — N390 Urinary tract infection, site not specified: Secondary | ICD-10-CM | POA: Diagnosis not present

## 2016-03-18 DIAGNOSIS — M19011 Primary osteoarthritis, right shoulder: Secondary | ICD-10-CM | POA: Diagnosis present

## 2016-03-18 DIAGNOSIS — J449 Chronic obstructive pulmonary disease, unspecified: Secondary | ICD-10-CM | POA: Diagnosis present

## 2016-03-18 DIAGNOSIS — R296 Repeated falls: Secondary | ICD-10-CM | POA: Diagnosis present

## 2016-03-18 DIAGNOSIS — F0280 Dementia in other diseases classified elsewhere without behavioral disturbance: Secondary | ICD-10-CM | POA: Diagnosis not present

## 2016-03-18 DIAGNOSIS — R4182 Altered mental status, unspecified: Secondary | ICD-10-CM | POA: Diagnosis not present

## 2016-03-18 DIAGNOSIS — G9341 Metabolic encephalopathy: Secondary | ICD-10-CM | POA: Diagnosis present

## 2016-03-18 DIAGNOSIS — Z8249 Family history of ischemic heart disease and other diseases of the circulatory system: Secondary | ICD-10-CM | POA: Diagnosis not present

## 2016-03-18 DIAGNOSIS — E860 Dehydration: Secondary | ICD-10-CM | POA: Diagnosis not present

## 2016-03-18 DIAGNOSIS — R918 Other nonspecific abnormal finding of lung field: Secondary | ICD-10-CM | POA: Diagnosis present

## 2016-03-18 DIAGNOSIS — M479 Spondylosis, unspecified: Secondary | ICD-10-CM | POA: Diagnosis present

## 2016-03-18 DIAGNOSIS — F0390 Unspecified dementia without behavioral disturbance: Secondary | ICD-10-CM | POA: Diagnosis present

## 2016-03-18 DIAGNOSIS — Z880 Allergy status to penicillin: Secondary | ICD-10-CM | POA: Diagnosis not present

## 2016-03-18 DIAGNOSIS — H9190 Unspecified hearing loss, unspecified ear: Secondary | ICD-10-CM | POA: Diagnosis present

## 2016-03-18 DIAGNOSIS — R1310 Dysphagia, unspecified: Secondary | ICD-10-CM | POA: Diagnosis not present

## 2016-03-18 DIAGNOSIS — Z6823 Body mass index (BMI) 23.0-23.9, adult: Secondary | ICD-10-CM | POA: Diagnosis not present

## 2016-03-18 DIAGNOSIS — I10 Essential (primary) hypertension: Secondary | ICD-10-CM | POA: Diagnosis present

## 2016-03-18 DIAGNOSIS — C3401 Malignant neoplasm of right main bronchus: Secondary | ICD-10-CM | POA: Diagnosis not present

## 2016-03-18 DIAGNOSIS — G9340 Encephalopathy, unspecified: Secondary | ICD-10-CM | POA: Diagnosis not present

## 2016-03-18 DIAGNOSIS — M19012 Primary osteoarthritis, left shoulder: Secondary | ICD-10-CM | POA: Diagnosis present

## 2016-03-18 DIAGNOSIS — Z66 Do not resuscitate: Secondary | ICD-10-CM | POA: Diagnosis present

## 2016-03-18 DIAGNOSIS — Z888 Allergy status to other drugs, medicaments and biological substances status: Secondary | ICD-10-CM

## 2016-03-18 DIAGNOSIS — N400 Enlarged prostate without lower urinary tract symptoms: Secondary | ICD-10-CM | POA: Diagnosis present

## 2016-03-18 DIAGNOSIS — J440 Chronic obstructive pulmonary disease with acute lower respiratory infection: Secondary | ICD-10-CM | POA: Diagnosis present

## 2016-03-18 DIAGNOSIS — R319 Hematuria, unspecified: Secondary | ICD-10-CM | POA: Diagnosis not present

## 2016-03-18 DIAGNOSIS — K922 Gastrointestinal hemorrhage, unspecified: Secondary | ICD-10-CM | POA: Diagnosis not present

## 2016-03-18 DIAGNOSIS — Z85118 Personal history of other malignant neoplasm of bronchus and lung: Secondary | ICD-10-CM

## 2016-03-18 DIAGNOSIS — R0602 Shortness of breath: Secondary | ICD-10-CM | POA: Diagnosis not present

## 2016-03-18 DIAGNOSIS — J189 Pneumonia, unspecified organism: Principal | ICD-10-CM | POA: Diagnosis present

## 2016-03-18 DIAGNOSIS — Y95 Nosocomial condition: Secondary | ICD-10-CM | POA: Diagnosis present

## 2016-03-18 DIAGNOSIS — R627 Adult failure to thrive: Secondary | ICD-10-CM | POA: Diagnosis present

## 2016-03-18 DIAGNOSIS — L8990 Pressure ulcer of unspecified site, unspecified stage: Secondary | ICD-10-CM | POA: Insufficient documentation

## 2016-03-18 DIAGNOSIS — C771 Secondary and unspecified malignant neoplasm of intrathoracic lymph nodes: Secondary | ICD-10-CM | POA: Diagnosis not present

## 2016-03-18 DIAGNOSIS — Z950 Presence of cardiac pacemaker: Secondary | ICD-10-CM | POA: Diagnosis not present

## 2016-03-18 DIAGNOSIS — S72002A Fracture of unspecified part of neck of left femur, initial encounter for closed fracture: Secondary | ICD-10-CM | POA: Diagnosis present

## 2016-03-18 LAB — URINALYSIS, ROUTINE W REFLEX MICROSCOPIC
BILIRUBIN URINE: NEGATIVE
Glucose, UA: NEGATIVE mg/dL
KETONES UR: NEGATIVE mg/dL
Nitrite: NEGATIVE
PH: 5 (ref 5.0–8.0)
PROTEIN: 30 mg/dL — AB
SQUAMOUS EPITHELIAL / LPF: NONE SEEN
Specific Gravity, Urine: 1.016 (ref 1.005–1.030)

## 2016-03-18 LAB — CBC WITH DIFFERENTIAL/PLATELET
BASOS ABS: 0 10*3/uL (ref 0.0–0.1)
Basophils Relative: 0 %
EOS PCT: 0 %
Eosinophils Absolute: 0 10*3/uL (ref 0.0–0.7)
HEMATOCRIT: 41.1 % (ref 39.0–52.0)
Hemoglobin: 13.6 g/dL (ref 13.0–17.0)
Lymphocytes Relative: 81 %
Lymphs Abs: 36.2 10*3/uL — ABNORMAL HIGH (ref 0.7–4.0)
MCH: 31.8 pg (ref 26.0–34.0)
MCHC: 33.1 g/dL (ref 30.0–36.0)
MCV: 96 fL (ref 78.0–100.0)
MONOS PCT: 5 %
Monocytes Absolute: 2.2 10*3/uL — ABNORMAL HIGH (ref 0.1–1.0)
NEUTROS PCT: 14 %
Neutro Abs: 6.2 10*3/uL (ref 1.7–7.7)
PLATELETS: 322 10*3/uL (ref 150–400)
RBC: 4.28 MIL/uL (ref 4.22–5.81)
RDW: 14.7 % (ref 11.5–15.5)
WBC: 44.6 10*3/uL — ABNORMAL HIGH (ref 4.0–10.5)

## 2016-03-18 LAB — RESPIRATORY PANEL BY PCR
Adenovirus: NOT DETECTED
BORDETELLA PERTUSSIS-RVPCR: NOT DETECTED
CORONAVIRUS 229E-RVPPCR: NOT DETECTED
Chlamydophila pneumoniae: NOT DETECTED
Coronavirus HKU1: DETECTED — AB
Coronavirus NL63: NOT DETECTED
Coronavirus OC43: NOT DETECTED
INFLUENZA A-RVPPCR: NOT DETECTED
INFLUENZA B-RVPPCR: NOT DETECTED
METAPNEUMOVIRUS-RVPPCR: NOT DETECTED
Mycoplasma pneumoniae: NOT DETECTED
PARAINFLUENZA VIRUS 2-RVPPCR: NOT DETECTED
Parainfluenza Virus 1: NOT DETECTED
Parainfluenza Virus 3: NOT DETECTED
Parainfluenza Virus 4: NOT DETECTED
RESPIRATORY SYNCYTIAL VIRUS-RVPPCR: NOT DETECTED
RHINOVIRUS / ENTEROVIRUS - RVPPCR: NOT DETECTED

## 2016-03-18 LAB — COMPREHENSIVE METABOLIC PANEL
ALBUMIN: 3.3 g/dL — AB (ref 3.5–5.0)
ALT: 19 U/L (ref 17–63)
AST: 31 U/L (ref 15–41)
Alkaline Phosphatase: 140 U/L — ABNORMAL HIGH (ref 38–126)
Anion gap: 6 (ref 5–15)
BILIRUBIN TOTAL: 0.4 mg/dL (ref 0.3–1.2)
BUN: 23 mg/dL — AB (ref 6–20)
CO2: 29 mmol/L (ref 22–32)
CREATININE: 0.87 mg/dL (ref 0.61–1.24)
Calcium: 9.5 mg/dL (ref 8.9–10.3)
Chloride: 107 mmol/L (ref 101–111)
GFR calc Af Amer: >60 mL/min (ref >60)
GLUCOSE: 110 mg/dL — AB (ref 65–99)
Potassium: 4.3 mmol/L (ref 3.5–5.1)
Sodium: 142 mmol/L (ref 135–145)
Total Protein: 6.8 g/dL (ref 6.5–8.1)

## 2016-03-18 LAB — TROPONIN I
Troponin I: 0.05 ng/mL (ref <0.03)
Troponin I: 0.04 ng/mL (ref <0.03)

## 2016-03-18 LAB — I-STAT CG4 LACTIC ACID, ED
LACTIC ACID, VENOUS: 1.12 mmol/L (ref 0.5–1.9)
LACTIC ACID, VENOUS: 1.42 mmol/L (ref 0.5–1.9)

## 2016-03-18 LAB — PHOSPHORUS: PHOSPHORUS: 2.6 mg/dL (ref 2.5–4.6)

## 2016-03-18 LAB — MAGNESIUM: Magnesium: 1.8 mg/dL (ref 1.7–2.4)

## 2016-03-18 LAB — INFLUENZA PANEL BY PCR (TYPE A & B)
INFLAPCR: NEGATIVE
Influenza B By PCR: NEGATIVE

## 2016-03-18 LAB — TSH: TSH: 3.625 u[IU]/mL (ref 0.350–4.500)

## 2016-03-18 MED ORDER — TRAMADOL HCL 50 MG PO TABS: 25.0000 mg | ORAL_TABLET | Freq: Four times a day (QID) | ORAL | Status: DC | PRN

## 2016-03-18 MED ORDER — DEXTROSE 5 % IV SOLN
1.0000 g | Freq: Three times a day (TID) | INTRAVENOUS | Status: DC
  Administered 2016-03-18 – 2016-03-19 (×2): 1 g via INTRAVENOUS
  Filled 2016-03-18 (×3): qty 1

## 2016-03-18 MED ORDER — BENZONATATE 100 MG PO CAPS: 100.0000 mg | ORAL_CAPSULE | Freq: Three times a day (TID) | ORAL | Status: DC | PRN

## 2016-03-18 MED ORDER — DEXTROSE 5 % IV SOLN: 500.0000 mg | INTRAVENOUS | Status: DC

## 2016-03-18 MED ORDER — VITAMIN D (ERGOCALCIFEROL) 1.25 MG (50000 UNIT) PO CAPS: 50000.0000 [IU] | ORAL_CAPSULE | ORAL | Status: DC

## 2016-03-18 MED ORDER — BISACODYL 10 MG RE SUPP: 10.0000 mg | Freq: Every day | RECTAL | Status: DC | PRN

## 2016-03-18 MED ORDER — SODIUM CHLORIDE 0.9 % IV SOLN
INTRAVENOUS | Status: DC
  Administered 2016-03-18 – 2016-03-19 (×2): via INTRAVENOUS

## 2016-03-18 MED ORDER — LEVALBUTEROL HCL 1.25 MG/0.5ML IN NEBU
1.2500 mg | INHALATION_SOLUTION | RESPIRATORY_TRACT | Status: DC | PRN
  Filled 2016-03-18: qty 0.5

## 2016-03-18 MED ORDER — MENTHOL (TOPICAL ANALGESIC) 4 % EX GEL: 1.0000 "application " | Freq: Every day | CUTANEOUS | Status: DC | PRN

## 2016-03-18 MED ORDER — MOMETASONE FURO-FORMOTEROL FUM 200-5 MCG/ACT IN AERO
2.0000 | INHALATION_SPRAY | Freq: Two times a day (BID) | RESPIRATORY_TRACT | Status: DC
  Filled 2016-03-18: qty 8.8

## 2016-03-18 MED ORDER — SIMETHICONE 80 MG PO CHEW: 80.0000 mg | CHEWABLE_TABLET | Freq: Four times a day (QID) | ORAL | Status: DC | PRN

## 2016-03-18 MED ORDER — LIP MEDEX EX OINT
TOPICAL_OINTMENT | CUTANEOUS | Status: AC
  Administered 2016-03-18: 16:00:00
  Filled 2016-03-18: qty 7

## 2016-03-18 MED ORDER — TIOTROPIUM BROMIDE MONOHYDRATE 18 MCG IN CAPS
18.0000 ug | ORAL_CAPSULE | Freq: Every day | RESPIRATORY_TRACT | Status: DC
  Filled 2016-03-18: qty 5

## 2016-03-18 MED ORDER — VANCOMYCIN HCL 10 G IV SOLR
1500.0000 mg | Freq: Once | INTRAVENOUS | Status: AC
  Administered 2016-03-18: 1500 mg via INTRAVENOUS
  Filled 2016-03-18: qty 1500

## 2016-03-18 MED ORDER — MUSCLE RUB 10-15 % EX CREA
TOPICAL_CREAM | Freq: Every day | CUTANEOUS | Status: DC | PRN
  Filled 2016-03-18: qty 85

## 2016-03-18 MED ORDER — DEXTROSE 5 % IV SOLN: 1.0000 g | Freq: Once | INTRAVENOUS | Status: DC

## 2016-03-18 MED ORDER — DEXTROSE 5 % IV SOLN: 1.0000 g | INTRAVENOUS | Status: DC

## 2016-03-18 MED ORDER — SODIUM CHLORIDE 0.9 % IV SOLN
INTRAVENOUS | Status: DC
  Administered 2016-03-18: 09:00:00 via INTRAVENOUS

## 2016-03-18 MED ORDER — DEXTROSE 5 % IV SOLN
2.0000 g | Freq: Once | INTRAVENOUS | Status: AC
  Administered 2016-03-18: 2 g via INTRAVENOUS
  Filled 2016-03-18: qty 2

## 2016-03-18 MED ORDER — FINASTERIDE 5 MG PO TABS
5.0000 mg | ORAL_TABLET | Freq: Every day | ORAL | Status: DC
  Administered 2016-03-18: 5 mg via ORAL
  Filled 2016-03-18: qty 1

## 2016-03-18 MED ORDER — DOCUSATE SODIUM 100 MG PO CAPS: 100.0000 mg | ORAL_CAPSULE | Freq: Every day | ORAL | Status: DC

## 2016-03-18 MED ORDER — VANCOMYCIN HCL IN DEXTROSE 750-5 MG/150ML-% IV SOLN
750.0000 mg | Freq: Two times a day (BID) | INTRAVENOUS | Status: DC
  Administered 2016-03-19: 750 mg via INTRAVENOUS
  Filled 2016-03-18: qty 150

## 2016-03-18 MED ORDER — PREDNISONE 20 MG PO TABS
40.0000 mg | ORAL_TABLET | Freq: Every day | ORAL | Status: DC
  Administered 2016-03-19: 40 mg via ORAL
  Filled 2016-03-18: qty 2

## 2016-03-18 MED ORDER — SENNA 8.6 MG PO TABS
1.0000 | ORAL_TABLET | Freq: Two times a day (BID) | ORAL | Status: DC
  Administered 2016-03-19 – 2016-03-20 (×2): 8.6 mg via ORAL
  Filled 2016-03-18 (×2): qty 1

## 2016-03-18 MED ORDER — AZITHROMYCIN 500 MG IV SOLR
500.0000 mg | Freq: Once | INTRAVENOUS | Status: AC
  Administered 2016-03-18: 500 mg via INTRAVENOUS
  Filled 2016-03-18: qty 500

## 2016-03-18 NOTE — ED Notes (Signed)
Bed: WA09 Expected date:  Expected time:  Means of arrival:  Comments: 83 yo AMS- Fall

## 2016-03-18 NOTE — ED Provider Notes (Signed)
[facility] DEPT Provider Note   CSN: [id number] Arrival date & time: 03/18/16  0804     History   Chief Complaint Chief Complaint  Patient presents with  . Altered Mental Status    HPI Seth Hunter is a 83 y.o. male.  83 year old male with history of dementia currently hospice patient who presents due to worsening mental status. Patient is currently being treated for UTI. Has had multiple falls which has consisted of him being helped to the ground. No reported head trauma. Patient's baseline is demented. His history is per EMS and no further information is obtainable.      Past Medical History:  Diagnosis Date  . Anemia 04/1995   Post op, 2nd to hemorrhage, left leg  . Atrioventricular block, complete ([place])   . BPH (benign prostatic hyperplasia)   . COPD (chronic obstructive pulmonary disease) ([place]) 04/1995   X-ray  . Dementia    thought this appears to be mild based on reports of family as of 8/11  . DJD (degenerative joint disease) 04/1995   Both shoulders, severe  . DJD (degenerative joint disease), lumbar 04/1995   Spine, severe.   X-ray  . Elevated TSH    Mildly elevated TSH  . GIB (gastrointestinal bleeding)    presumed diverticular bleed, 02/2014 admission  . Hip fracture ([place])   . HOH (hard of hearing)   . Hypertension   . Inguinal hernia   . Pacemaker -MDT   . Pneumonia 04/1995   Bilateral upper lobes  . Syncope    Syncope in the setting of complete heart block status post permanent pacemaker placement on September 27, 2007  . Tremor   . UTI (urinary tract infection) 05/1995    Patient Active Problem List   Diagnosis Date Noted  . Pelvic fracture ([place]) 02/15/2016  . Leukocytosis 02/14/2016  . UTI (urinary tract infection) 02/14/2016  . Acute encephalopathy 02/14/2016  . Fall 02/14/2016  . DNR (do not resuscitate) 11/11/2014  . Skin lesion 11/11/2014  . Lung mass 08/20/2014  . Bilateral impacted cerumen 03/16/2014  . Abnormal TSH 03/16/2014    . Lower GI bleed 02/27/2014  . Other dysphagia 09/03/2013  . PNA (pneumonia) 02/13/2013  . Unspecified vitamin D deficiency 02/13/2013  . BPH (benign prostatic hyperplasia) 10/21/2012  . Closed left hip fracture ([place]) 10/20/2012  . Pacemaker -MDT   . Essential hypertension 09/26/2007    Past Surgical History:  Procedure Laterality Date  . CHOLECYSTECTOMY  1988  . 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 CT Angio Chest  09/27/07   No PE, No acute prob  . FEMUR IM NAIL Left 10/21/2012   Procedure: INTRAMEDULLARY (IM) NAIL FEMORAL;  Surgeon: Meredith Pel, MD;  Location: [facility]S;  Service: Orthopedics;  Laterality: Left;  . FRACTURE SURGERY  2014   L femur  . Fractured left wrist  1990   with old right posterior rib fracture  . Hematoma, left leg  05/1995   Probably 2nd to anticoag  . HERNIA REPAIR  Years ago   Left inguinal  . PACEMAKER INSERTION    . Pacer Placement (Dr. Lovena Le)  09/27/2007  . PULMONARY EMBOLISM SURGERY  04/1995   Post-op  . 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 Syncope Comp Heart Block  9/10- 09/30/2007   HOSP Perm Pacer placed, HTN, dementia, mildly elevated TSH  . VENTRAL HERNIA REPAIR  04/1995   with intra abdominal adhesions,  lysis incidental appendectomy       Home Medications    Prior to Admission medications   Medication Sig Start Date End Date Taking? Authorizing Provider  benzonatate (TESSALON) 100 MG capsule Take 1 capsule (100 mg total) by mouth 3 (three) times daily as needed for cough. 03/07/16   Lauree Chandler, NP  bisacodyl (DULCOLAX) 10 MG suppository Place 1 suppository (10 mg total) rectally daily as needed for moderate constipation. 02/17/16   Belkys A Regalado, MD  ciprofloxacin (CIPRO) 500 MG tablet Take 1 tablet (500 mg total) by mouth 2 (two) times daily. 03/16/16   Tonia Ghent, MD  docusate sodium (COLACE) 100 MG capsule Take 1 capsule (100 mg total) by mouth daily. 02/18/16    Belkys A Regalado, MD  finasteride (PROSCAR) 5 MG tablet Take 1 tablet (5 mg total) by mouth daily. 03/07/16   Lauree Chandler, NP  Menthol, Topical Analgesic, (BIOFREEZE) 4 % GEL Apply 1 application topically daily as needed. Applied to knees for arthritis    Historical Provider, MD  senna (SENOKOT) 8.6 MG TABS tablet Take 1 tablet (8.6 mg total) by mouth 2 (two) times daily. 02/17/16   Belkys A Regalado, MD  simethicone (MYLICON) 80 MG chewable tablet Chew 80 mg by mouth every 6 (six) hours as needed for flatulence.    Historical Provider, MD  traMADol (ULTRAM) 50 MG tablet Take 0.5 tablets (25 mg total) by mouth every 6 (six) hours as needed for moderate pain. 02/17/16   Belkys A Regalado, MD  UNABLE TO FIND Med Name: Med Pass 120 ml by mouth twice daily    Historical Provider, MD  Vitamin D, Ergocalciferol, (DRISDOL) 50000 units CAPS capsule Take 1 capsule (50,000 Units total) by mouth every 7 (seven) days. 03/07/16   Lauree Chandler, NP    Family History Family History  Problem Relation Age of Onset  . Heart disease Father     Hardening of the arteries, heart trouble    Social History Social History  Substance Use Topics  . Smoking status: Never Smoker  . Smokeless tobacco: Never Used  . Alcohol use No     Allergies   Coricidin hbp cough-cold [chlorpheniramine-dm] and Penicillins   Review of Systems Review of Systems  Unable to perform ROS: Dementia     Physical Exam Updated Vital Signs Wt 78 kg   SpO2 95%   BMI 23.33 kg/m   Physical Exam  Constitutional: He appears well-developed and well-nourished.  Non-toxic appearance. No distress.  HENT:  Head: Normocephalic and atraumatic.  Eyes: Conjunctivae, EOM and lids are normal. Pupils are equal, round, and reactive to light.  Neck: Normal range of motion. Neck supple. No tracheal deviation present. No thyroid mass present.  Cardiovascular: Normal rate, regular rhythm and normal heart sounds.  Exam reveals no gallop.     No murmur heard. Pulmonary/Chest: Effort normal and breath sounds normal. No stridor. No respiratory distress. He has no decreased breath sounds. He has no wheezes. He has no rhonchi. He has no rales.  Abdominal: Soft. Normal appearance and bowel sounds are normal. He exhibits no distension. There is no tenderness. There is no rebound and no CVA tenderness.  Musculoskeletal: Normal range of motion. He exhibits no edema or tenderness.  Neurological: He is disoriented. He displays atrophy. No cranial nerve deficit or sensory deficit. GCS eye subscore is 4. GCS verbal subscore is 5. GCS motor subscore is 6.  Pt withdraws to pain in all 4 extremities  Skin: Skin is warm and dry. No abrasion and no rash noted.  Psychiatric: His affect is inappropriate. His speech is tangential. He is withdrawn. He is inattentive.  Nursing note and vitals reviewed.    ED Treatments / Results  Labs (all labs ordered are listed, but only abnormal results are displayed) Labs Reviewed  URINE CULTURE  CBC WITH DIFFERENTIAL/PLATELET  COMPREHENSIVE METABOLIC PANEL  URINALYSIS, ROUTINE W REFLEX MICROSCOPIC    EKG  EKG Interpretation None       Radiology No results found.  Procedures Procedures (including critical care time)  Medications Ordered in ED Medications  0.9 %  sodium chloride infusion (not administered)     Initial Impression / Assessment and Plan / ED Course  I have reviewed the triage vital signs and the nursing notes.  Pertinent labs & imaging results that were available during my care of the patient were reviewed by me and considered in my medical decision making (see chart for details).     Pt started On IV antibiotics for his UTI as well as his pneumonia. Discussed his results. Will admit to the hospitalist.  Final Clinical Impressions(s) / ED Diagnoses   Final diagnoses:  SOB (shortness of breath)    New Prescriptions New Prescriptions   No medications on file      Lacretia Leigh, MD 03/18/16 1101

## 2016-03-18 NOTE — Progress Notes (Addendum)
Pharmacy Antibiotic Follow-up Note  Seth Hunter is a 83 y.o. year-old male admitted on 03/18/2016.  The patient is currently on day 1 of Rocephin/Azithromycin for CAP. Recent UTI outpatient tx with Cipro from 3/1, AMS changes, frequent falls, Hospice patient.  Assessment/Plan: Rocephin 2gm x1, then 1gm q24 x 6 doses Azithromycin '500mg'$  q24 x 7 doses Pharmacy signed off Rocephin protocol, no renal adjustment required for either abx  Addendum 14:00  Discontinue Rocephin & Azithromycin, recent admit 1/29 for Hip fracture  Change abx to Vancomycin & Cefepime  Vancomycin '1500mg'$  x1, followed by '750mg'$  q12  Cefepime 1gm q8hr  Temp (24hrs), Avg:97.4 F (36.3 C), Min:97.4 F (36.3 C), Max:97.4 F (36.3 C)  No results for input(s): WBC in the last 168 hours.  Invalid input(s):  CREATININE No results for input(s): CREATININE in the last 168 hours. CrCl cannot be calculated (Patient's most recent lab result is older than the maximum 21 days allowed.).    Allergies  Allergen Reactions  . Coricidin Hbp Cough-Cold [Chlorpheniramine-Dm] Other (See Comments)    Sedation.   Marland Kitchen Penicillins Other (See Comments)    Has patient had a PCN reaction causing immediate rash, facial/tongue/throat swelling, SOB or lightheadedness with hypotension: No Has patient had a PCN reaction causing severe rash involving mucus membranes or skin necrosis: No Has patient had a PCN reaction that required hospitalization No Has patient had a PCN reaction occurring within the last 10 years: No If all of the above answers are "NO", then may proceed with Cephalosporin use.   Antimicrobials this admission: 3/3 Rocephin >> 3/3 3/3 Azithromycin >> 3/3  3/3 Vancomycin >> 3/3 Cefepime >>  Microbiology results: 3/3  BCx: sent 3/3 UCx: sent  3/3 Resp panel: sent  Thank you for allowing pharmacy to be a part of this patient's care.  Minda Ditto PharmD Pager [phone] 03/18/2016, 2:43 PM

## 2016-03-18 NOTE — Progress Notes (Signed)
[facility] Liaison:  RN GIP visit   This is a covered and related GIP admission from 03/18/16 with an [facility] diagnosis of Carcinoma of Right Lung by Dr. Karie Georges. Code status is DNR. Patient's PCG called [facility] in regards to fall x 2 in the last 24 hours.  Patient was having hallucinations (per Sandi, niece/PCG), AMS, and was having problems getting up after fall this morning.  After calling [facility], Sandi initiated EMS to take patient to [facility] ED.    Visited with patient in his room today, no one else was with patient at this time.  Patient is alert and pleasantly disoriented, NAD.  Spoke with Estero, niece, over the phone and discussed patient with her.   She advised that he is falling more and there is a concern that he may not be able to stay in the home.   Sandi will be back tomorrow morning at 0900am and I will meet with her then to discuss how she wants to proceed with patient care.   I talked with Rollene Fare, RN, at bedside and she advised that patient has been disoriented, but has been comfortable for her.   Patient does have reddened and swollen bilateral feet that are of concern to RN - she will follow up with MD.    Patient has had Vancomycin (Vancocin) 1500 mg in sodium chloride 0.9% 500 mL IVPB, dose 1500 mg via IV, Continuous Infusion:  0.9% sodium chloride infusion, Rate 100 mL/hr via IV.  Patient is to receive:  ceFEPIme (MAXIPIME) 1g in dextrose 5% 78m IVPB, Dose 1G via IV tonight at 1100pm.  Patient has breathing treatments ordered, but has not received as of this time.     Notified Dr. FKarie Georgesof admission.  Left message with office staff to advise Dr. DDamita Dunnings AOR,  of same.  Transfer summary and medication list placed on shadow chart.   We will continue to follow patient while in the hospital.   Thank you,  AEdyth Gunnels RN, [facility] [phone] All hospital liaison's are now on [facility].   Please feel free to contact me at the above number or call [phone]  hospice after 5pm.

## 2016-03-18 NOTE — Progress Notes (Signed)
[facility] Liaison:  RN  Spoke with [name], patient's niece, who is at bedside.  She is concerned as to whether she will be able to care for patient at home.  She advised she had to leave, but would be back tomorrow morning.  I advised I would be glad to meet with her tomorrow morning at 0900am in patient's room.  Patient is awake, but not alert, having intermittent hallucinations per niece.   Patient is oriented to person only at this time per niece.   Patient will be admitted for UTI and PNA.    Thank you,  Edyth Gunnels, RN, [facility] Liaison [phone]  All hospital liaison's are now on [place].

## 2016-03-18 NOTE — H&P (Signed)
History and Physical    Seth Hunter ERX:[id number] DOB: 26-Nov-1918 DOA: 03/18/2016  Referring MD/NP/PA: EDP PCP: Elsie Stain, MD  Outpatient Specialists: [facility] Patient coming from: Home  Chief Complaint: Weakness and Fall  HPI: Seth Hunter is a 83 y.o. male with medical history significant for but not limited to lung cancer, COPD, dementia who was brought to the ED by family on account of 2 day history of progressively worsening confusion with decreased responsiveness preceded by episodes of falls at home.  Patient was recently sent to discharge from the hospital on 02/18/2016 to SNF after treatment for nondisplaced left superior and inferior rami fractures. He was discharged home week ago from the SNF.  Patient is confused and unable to provide any significant consistent history otherwise. ED Course: at the ED patient was noted to be confused with urinalysis positive for UTI. Chest x-ray was ordered which was notable for leftlower lobe acute infiltrate. He is admitted for onward care.  Patient's niece at bedside today mentions that "he hates all of the wires" and patient will not be put on telemetry. Review of Systems: Unable to offer due to confusion   Past Medical History:  Diagnosis Date  . Anemia 04/1995   Post op, 2nd to hemorrhage, left leg  . Atrioventricular block, complete ([place])   . BPH (benign prostatic hyperplasia)   . COPD (chronic obstructive pulmonary disease) ([place]) 04/1995   X-ray  . Dementia    thought this appears to be mild based on reports of family as of 8/11  . DJD (degenerative joint disease) 04/1995   Both shoulders, severe  . DJD (degenerative joint disease), lumbar 04/1995   Spine, severe.   X-ray  . Elevated TSH    Mildly elevated TSH  . GIB (gastrointestinal bleeding)    presumed diverticular bleed, 02/2014 admission  . Hip fracture ([place])   . HOH (hard of hearing)   . Hypertension   . Inguinal hernia   . Pacemaker -MDT   .  Pneumonia 04/1995   Bilateral upper lobes  . Syncope    Syncope in the setting of complete heart block status post permanent pacemaker placement on September 27, 2007  . Tremor   . UTI (urinary tract infection) 05/1995    Past Surgical History:  Procedure Laterality Date  . CHOLECYSTECTOMY  1988  . 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 CT Angio Chest  09/27/07   No PE, No acute prob  . FEMUR IM NAIL Left 10/21/2012   Procedure: INTRAMEDULLARY (IM) NAIL FEMORAL;  Surgeon: Meredith Pel, MD;  Location: [facility]S;  Service: Orthopedics;  Laterality: Left;  . FRACTURE SURGERY  2014   L femur  . Fractured left wrist  1990   with old right posterior rib fracture  . Hematoma, left leg  05/1995   Probably 2nd to anticoag  . HERNIA REPAIR  Years ago   Left inguinal  . PACEMAKER INSERTION    . Pacer Placement (Dr. Lovena Le)  09/27/2007  . PULMONARY EMBOLISM SURGERY  04/1995   Post-op  . 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 Syncope Comp Heart Block  9/10- 09/30/2007   HOSP Perm Pacer placed, HTN, dementia, mildly elevated TSH  . VENTRAL HERNIA REPAIR  04/1995   with intra abdominal adhesions, lysis incidental appendectomy     reports that he has never smoked. He has never used smokeless tobacco. He reports that  he does not drink alcohol or use drugs.  Allergies  Allergen Reactions  . Coricidin Hbp Cough-Cold [Chlorpheniramine-Dm] Other (See Comments)    Sedation.   Marland Kitchen Penicillins Other (See Comments)    Has patient had a PCN reaction causing immediate rash, facial/tongue/throat swelling, SOB or lightheadedness with hypotension: No Has patient had a PCN reaction causing severe rash involving mucus membranes or skin necrosis: No Has patient had a PCN reaction that required hospitalization No Has patient had a PCN reaction occurring within the last 10 years: No If all of the above answers are "NO", then may proceed with Cephalosporin  use.    Family History  Problem Relation Age of Onset  . Heart disease Father     Hardening of the arteries, heart trouble     Prior to Admission medications   Medication Sig Start Date End Date Taking? Authorizing Provider  benzonatate (TESSALON) 100 MG capsule Take 1 capsule (100 mg total) by mouth 3 (three) times daily as needed for cough. 03/07/16   Lauree Chandler, NP  bisacodyl (DULCOLAX) 10 MG suppository Place 1 suppository (10 mg total) rectally daily as needed for moderate constipation. 02/17/16   Belkys A Regalado, MD  ciprofloxacin (CIPRO) 500 MG tablet Take 1 tablet (500 mg total) by mouth 2 (two) times daily. 03/16/16   Tonia Ghent, MD  docusate sodium (COLACE) 100 MG capsule Take 1 capsule (100 mg total) by mouth daily. 02/18/16   Belkys A Regalado, MD  finasteride (PROSCAR) 5 MG tablet Take 1 tablet (5 mg total) by mouth daily. 03/07/16   Lauree Chandler, NP  Menthol, Topical Analgesic, (BIOFREEZE) 4 % GEL Apply 1 application topically daily as needed. Applied to knees for arthritis    Historical Provider, MD  senna (SENOKOT) 8.6 MG TABS tablet Take 1 tablet (8.6 mg total) by mouth 2 (two) times daily. 02/17/16   Belkys A Regalado, MD  simethicone (MYLICON) 80 MG chewable tablet Chew 80 mg by mouth every 6 (six) hours as needed for flatulence.    Historical Provider, MD  traMADol (ULTRAM) 50 MG tablet Take 0.5 tablets (25 mg total) by mouth every 6 (six) hours as needed for moderate pain. 02/17/16   Belkys A Regalado, MD  UNABLE TO FIND Med Name: Med Pass 120 ml by mouth twice daily    Historical Provider, MD  Vitamin D, Ergocalciferol, (DRISDOL) 50000 units CAPS capsule Take 1 capsule (50,000 Units total) by mouth every 7 (seven) days. 03/07/16   Lauree Chandler, NP    Physical Exam: Vitals:   03/18/16 0809 03/18/16 0815 03/18/16 0820 03/18/16 1011  BP:   126/96 104/62  Pulse:   93 84  Resp:    15  Temp:   97.4 F (36.3 C)   TempSrc:   Rectal   SpO2: 95%  95% 95%    Weight:  78 kg (172 lb)        Constitutional: NAD, calm, comfortable Vitals:   03/18/16 0809 03/18/16 0815 03/18/16 0820 03/18/16 1011  BP:   126/96 104/62  Pulse:   93 84  Resp:    15  Temp:   97.4 F (36.3 C)   TempSrc:   Rectal   SpO2: 95%  95% 95%  Weight:  78 kg (172 lb)     Eyes: PERRL, lids and conjunctivae normal ENMT:Dry  Mucous membranes. Posterior pharynx clear of any exudate or lesions.Normal dentition.  Neck: normal, supple, no masses, no thyromegaly Respiratory: diminished breath sounds  left base.. Normal respiratory effort. No accessory muscle use.  Cardiovascular: Regular rate and rhythm, no murmurs / rubs / gallops. No extremity edema. 2+ pedal pulses. No carotid bruits.  Abdomen: no tenderness, no masses palpated. No hepatosplenomegaly. Bowel sounds positive.  Musculoskeletal: no clubbing / cyanosis. No joint deformity upper and lower extremities. Good ROM, no contractures. Normal muscle tone.  Neurologic: mildly lethargic but arousable, disoriented, no obvious acute focal deficit(s)    Labs on Admission: I have personally reviewed following labs and imaging studies  CBC:  Recent Labs Lab 03/18/16 0940  WBC 44.6*  NEUTROABS 6.2  HGB 13.6  HCT 41.1  MCV 96.0  PLT 295   Basic Metabolic Panel:  Recent Labs Lab 03/18/16 0940  NA 142  K 4.3  CL 107  CO2 29  GLUCOSE 110*  BUN 23*  CREATININE 0.87  CALCIUM 9.5   GFR: Estimated Creatinine Clearance: 55.7 mL/min (by C-G formula based on SCr of 0.87 mg/dL). Liver Function Tests:  Recent Labs Lab 03/18/16 0940  AST 31  ALT 19  ALKPHOS 140*  BILITOT 0.4  PROT 6.8  ALBUMIN 3.3*   No results for input(s): LIPASE, AMYLASE in the last 168 hours. No results for input(s): AMMONIA in the last 168 hours. Coagulation Profile: No results for input(s): INR, PROTIME in the last 168 hours. Cardiac Enzymes: No results for input(s): CKTOTAL, CKMB, CKMBINDEX, TROPONINI in the last 168 hours. BNP  (last 3 results) No results for input(s): PROBNP in the last 8760 hours. HbA1C: No results for input(s): HGBA1C in the last 72 hours. CBG: No results for input(s): GLUCAP in the last 168 hours. Lipid Profile: No results for input(s): CHOL, HDL, LDLCALC, TRIG, CHOLHDL, LDLDIRECT in the last 72 hours. Thyroid Function Tests: No results for input(s): TSH, T4TOTAL, FREET4, T3FREE, THYROIDAB in the last 72 hours. Anemia Panel: No results for input(s): VITAMINB12, FOLATE, FERRITIN, TIBC, IRON, RETICCTPCT in the last 72 hours. Urine analysis:    Component Value Date/Time   COLORURINE YELLOW 03/18/2016 0835   APPEARANCEUR HAZY (A) 03/18/2016 0835   LABSPEC 1.016 03/18/2016 0835   PHURINE 5.0 03/18/2016 0835   GLUCOSEU NEGATIVE 03/18/2016 0835   HGBUR MODERATE (A) 03/18/2016 0835   HGBUR small 07/30/2006 1538   BILIRUBINUR NEGATIVE 03/18/2016 0835   KETONESUR NEGATIVE 03/18/2016 0835   PROTEINUR 30 (A) 03/18/2016 0835   UROBILINOGEN 0.2 06/01/2013 0349   NITRITE NEGATIVE 03/18/2016 0835   LEUKOCYTESUR LARGE (A) 03/18/2016 0835   Sepsis Labs: '@LABRCNTIP'$ (procalcitonin:4,lacticidven:4) )No results found for this or any previous visit (from the past 240 hour(s)).   Radiological Exams on Admission: Dg Chest 2 View  Result Date: 03/18/2016 CLINICAL DATA:  Worsening mental status. EXAM: CHEST  2 VIEW COMPARISON:  02/14/2016 FINDINGS: The heart is enlarged but stable. Moderate tortuosity, ectasia and calcification of the thoracic aorta. Severe chronic lung disease with emphysema and pulmonary fibrosis. Increased basilar density on the left suspicious for infiltrate. No pleural effusions. IMPRESSION: Underlying chronic lung disease with suspected superimposed left lower lobe infiltrate. Electronically Signed   By: Marijo Sanes M.D.   On: 03/18/2016 09:22    EKG: ordered  Assessment/Plan Active Problems:   * No active hospital problems. * #1 LLL HCAP: IV antibiotics Supportive  care Follow-up on leukocytosis and cultures  #2 UTI: Antibiotic coverage Follow-up on urine culture  #3 Dehydration: IV rehydration  #4 Altered Mental Status: Due TO #1-3 Supportive care, tx of primary etiologies  DVT prophylaxis:  (SCD's) Code Status:  (DNR)  Family Communication: Baltazar Apo, Elmer (Tel # [phone]) Disposition Plan: ( SNF) Consults called: ([place]) Admission status: (inpatient)   OSEI-BONSU,Herson Prichard MD [facility] Pager [phone]  If 7PM-7AM, please contact night-coverage [email or web address] Password [email or web address]  03/18/2016, 11:26 AM

## 2016-03-18 NOTE — ED Triage Notes (Signed)
Per EMS family request evaluation related to pt recent worsening AMS, hallucinations, multiple recent falls; pt currently being treated for UTI; pt has dementia and is hospice pt.

## 2016-03-18 NOTE — ED Notes (Addendum)
Attempt straight stick twice unsuccessful. Covil verbalizes will attempt with pt return from [facility].

## 2016-03-18 NOTE — ED Notes (Addendum)
Per [facility] hospice RN recommend Social Work Consult. Zenia Resides MD aware.

## 2016-03-18 NOTE — ED Notes (Signed)
Seth Hunter from [phone] verbalizes should hear from hospice nurse within 15 minutes.

## 2016-03-18 NOTE — Progress Notes (Signed)
CRITICAL VALUE ALERT  Critical value received:  Troponin 0.05  Date of notification:  03/18/2016  Time of notification:  8887  Critical value read back:Yes.    Nurse who received alert:  Wilkie Aye, RN  MD notified (1st page):  Osei-Bonsu  Time of first page:  1528  MD notified (2nd page):  Time of second page:  Responding MD:  No response from MD  Time MD responded:

## 2016-03-19 DIAGNOSIS — E860 Dehydration: Secondary | ICD-10-CM

## 2016-03-19 DIAGNOSIS — R918 Other nonspecific abnormal finding of lung field: Secondary | ICD-10-CM

## 2016-03-19 DIAGNOSIS — I10 Essential (primary) hypertension: Secondary | ICD-10-CM

## 2016-03-19 DIAGNOSIS — J189 Pneumonia, unspecified organism: Principal | ICD-10-CM

## 2016-03-19 DIAGNOSIS — Z950 Presence of cardiac pacemaker: Secondary | ICD-10-CM

## 2016-03-19 DIAGNOSIS — R319 Hematuria, unspecified: Secondary | ICD-10-CM

## 2016-03-19 DIAGNOSIS — G9340 Encephalopathy, unspecified: Secondary | ICD-10-CM

## 2016-03-19 DIAGNOSIS — N390 Urinary tract infection, site not specified: Secondary | ICD-10-CM

## 2016-03-19 LAB — COMPREHENSIVE METABOLIC PANEL
ALT: 19 U/L (ref 17–63)
AST: 30 U/L (ref 15–41)
Albumin: 2.8 g/dL — ABNORMAL LOW (ref 3.5–5.0)
Alkaline Phosphatase: 119 U/L (ref 38–126)
Anion gap: 5 (ref 5–15)
BUN: 18 mg/dL (ref 6–20)
CHLORIDE: 110 mmol/L (ref 101–111)
CO2: 27 mmol/L (ref 22–32)
CREATININE: 0.86 mg/dL (ref 0.61–1.24)
Calcium: 8.8 mg/dL — ABNORMAL LOW (ref 8.9–10.3)
GFR calc Af Amer: >60 mL/min (ref >60)
Glucose, Bld: 89 mg/dL (ref 65–99)
POTASSIUM: 3.7 mmol/L (ref 3.5–5.1)
SODIUM: 142 mmol/L (ref 135–145)
Total Bilirubin: 0.7 mg/dL (ref 0.3–1.2)
Total Protein: 6.2 g/dL — ABNORMAL LOW (ref 6.5–8.1)

## 2016-03-19 LAB — TROPONIN I: TROPONIN I: 0.05 ng/mL — AB (ref <0.03)

## 2016-03-19 LAB — CBC
HEMATOCRIT: 34.6 % — AB (ref 39.0–52.0)
Hemoglobin: 11.3 g/dL — ABNORMAL LOW (ref 13.0–17.0)
MCH: 30.9 pg (ref 26.0–34.0)
MCHC: 32.7 g/dL (ref 30.0–36.0)
MCV: 94.5 fL (ref 78.0–100.0)
Platelets: 295 10*3/uL (ref 150–400)
RBC: 3.66 MIL/uL — ABNORMAL LOW (ref 4.22–5.81)
RDW: 14.7 % (ref 11.5–15.5)
WBC: 40.3 10*3/uL — AB (ref 4.0–10.5)

## 2016-03-19 LAB — STREP PNEUMONIAE URINARY ANTIGEN: STREP PNEUMO URINARY ANTIGEN: NEGATIVE

## 2016-03-19 LAB — GLUCOSE, CAPILLARY: Glucose-Capillary: 95 mg/dL (ref 65–99)

## 2016-03-19 MED ORDER — MORPHINE SULFATE (CONCENTRATE) 10 MG/0.5ML PO SOLN
4.0000 mg | ORAL | 0 refills | Status: AC | PRN
Start: 2016-03-19 — End: ?

## 2016-03-19 MED ORDER — MORPHINE SULFATE (CONCENTRATE) 10 MG/0.5ML PO SOLN
4.0000 mg | ORAL | Status: DC | PRN
  Administered 2016-03-19 – 2016-03-20 (×2): 4 mg via ORAL
  Filled 2016-03-19 (×2): qty 0.5

## 2016-03-19 MED ORDER — LORAZEPAM 2 MG/ML IJ SOLN: 1.0000 mg | INTRAMUSCULAR | Status: DC | PRN

## 2016-03-19 MED ORDER — LORAZEPAM 2 MG/ML PO CONC: 1.0000 mg | ORAL | 0 refills | Status: AC | PRN

## 2016-03-19 NOTE — Progress Notes (Signed)
PROGRESS NOTE  Seth Hunter HFW:[id number] DOB: 07/06/1918 DOA: 03/18/2016 PCP: Elsie Stain, MD  Brief History:  83 year old male with a history of complete AV block, COPD, dementia, hypertension, and presumptive lung cancer presents with 2-3 day history of increasing weakness and confusion. The patient was recently admitted to the hospital from 02/14/2016 through 02/18/2016 at which time, the patient was found to have a left superior and inferior pubic rami fracture after a mechanical fall. The patient was also treated for acute encephalopathy secondary to UTI and acute kidney injury. He was discharged to a skilled nursing facility. However, he has returned home under the care of his niece for approximately one week prior to this admission. His niece states that the patient has continued to make but declined and has not recovered functionally or cognitively since his last hospital admission. There has not been any vomiting, diarrhea, uncontrolled pain, but the patient has continued to have mechanical falls. Upon presentation, the patient was noted to have WBC 44.6 with urinalysis showing TNTC WBC. He was started on vancomycin and cefepime for possible HCAP and UTI.  Assessment/Plan: Acute metabolic encephalopathy -Secondary to infectious process -The patient has continued to have progressive cognitive and functional decline since his last hospital admission -Long discussion with the patient's niece who is his POA--she is very knowledgeable about his overall prognosis and agrees to full comfort care and transition to residential hospice  UTI -d/c abx -pt's focus of care has been transitioned to that focused on full comfort  HCAP -d/c abx -pt's focus of care has been transitioned to that focused on full comfort  Dehydration/Failure to Thrive -d/c abx and IVF -pt's focus of care has been transitioned to that focused on full comfort  COPD -stable on RA -pt's focus of care has been  transitioned to that focused on full comfort -xopenex prn dyspnea  HTN -no longer active issue --pt's focus of care has been transitioned to that focused on full comfort  Complete Heart Block -no longer active issue --pt's focus of care has been transitioned to that focused on full comfort -s/p PPM  GOC -DNR --pt's focus of care has been transitioned to that focused on full comfort -roxanol prn pain and dyspnea   Disposition Plan:   Residential hospice when bed available Family Communication:   Niece updated on phone--Total time spent 35 minutes.  Greater than 50% spent face to face counseling and coordinating care.   Consultants:  none  Code Status:  FULL COMFORT  DVT Prophylaxis:  FULL COMFORT   Procedures: As Listed in Progress Note Above  Antibiotics: None    Subjective: Patient awakens, but does not follow commands or answer questions appropriately. Review of systems unobtainable.  Objective: Vitals:   03/18/16 1245 03/18/16 1335 03/18/16 2215 03/19/16 0648  BP: (!) 100/50 (!) 102/44 118/88 120/60  Pulse: 90 82 82 70  Resp: '18 18 16 16  '$ Temp:  97.9 F (36.6 C) 98.1 F (36.7 C) 98.5 F (36.9 C)  TempSrc:  Axillary Axillary Axillary  SpO2: 94% 99% 95% 95%  Weight:        Intake/Output Summary (Last 24 hours) at 03/19/16 0859 Last data filed at 03/19/16 0411  Gross per 24 hour  Intake             1985 ml  Output                0 ml  Net  1985 ml   Weight change:  Exam:   General:  Pt is alert, not in acute distress  HEENT: No icterus, No thrush, No neck mass, [place]/AT  Cardiovascular: RRR, S1/S2, no rubs, no gallops  Respiratory: Bibasilar rales. No wheezing.  Abdomen: Soft/+BS, non tender, non distended, no guarding  Extremities: 1 + LE edema, No lymphangitis, No petechiae, No rashes, no synovitis   Data Reviewed: I have personally reviewed following labs and imaging studies Basic Metabolic Panel:  Recent Labs Lab  03/18/16 0940 03/18/16 1411 03/19/16 0123  NA 142  --  142  K 4.3  --  3.7  CL 107  --  110  CO2 29  --  27  GLUCOSE 110*  --  89  BUN 23*  --  18  CREATININE 0.87  --  0.86  CALCIUM 9.5  --  8.8*  MG  --  1.8  --   PHOS  --  2.6  --    Liver Function Tests:  Recent Labs Lab 03/18/16 0940 03/19/16 0123  AST 31 30  ALT 19 19  ALKPHOS 140* 119  BILITOT 0.4 0.7  PROT 6.8 6.2*  ALBUMIN 3.3* 2.8*   No results for input(s): LIPASE, AMYLASE in the last 168 hours. No results for input(s): AMMONIA in the last 168 hours. Coagulation Profile: No results for input(s): INR, PROTIME in the last 168 hours. CBC:  Recent Labs Lab 03/18/16 0940 03/19/16 0123  WBC 44.6* 40.3*  NEUTROABS 6.2  --   HGB 13.6 11.3*  HCT 41.1 34.6*  MCV 96.0 94.5  PLT 322 295   Cardiac Enzymes:  Recent Labs Lab 03/18/16 1411 03/18/16 1817 03/19/16 0123  TROPONINI 0.05* 0.04* 0.05*   BNP: Invalid input(s): POCBNP CBG:  Recent Labs Lab 03/19/16 0743  GLUCAP 95   HbA1C: No results for input(s): HGBA1C in the last 72 hours. Urine analysis:    Component Value Date/Time   COLORURINE YELLOW 03/18/2016 0835   APPEARANCEUR HAZY (A) 03/18/2016 0835   LABSPEC 1.016 03/18/2016 0835   PHURINE 5.0 03/18/2016 0835   GLUCOSEU NEGATIVE 03/18/2016 0835   HGBUR MODERATE (A) 03/18/2016 0835   HGBUR small 07/30/2006 1538   BILIRUBINUR NEGATIVE 03/18/2016 0835   KETONESUR NEGATIVE 03/18/2016 0835   PROTEINUR 30 (A) 03/18/2016 0835   UROBILINOGEN 0.2 06/01/2013 0349   NITRITE NEGATIVE 03/18/2016 0835   LEUKOCYTESUR LARGE (A) 03/18/2016 0835   Sepsis Labs: '@LABRCNTIP'$ (procalcitonin:4,lacticidven:4) ) Recent Results (from the past 240 hour(s))  Urine culture     Status: Abnormal (Preliminary result)   Collection Time: 03/18/16  8:35 AM  Result Value Ref Range Status   Specimen Description URINE, CATHETERIZED  Final   Special Requests NONE  Final   Culture 80,000 COLONIES/mL PROTEUS MIRABILIS  (A)  Final   Report Status PENDING  Incomplete  Respiratory Panel by PCR     Status: Abnormal   Collection Time: 03/18/16  1:16 PM  Result Value Ref Range Status   Adenovirus NOT DETECTED NOT DETECTED Final   Coronavirus 229E NOT DETECTED NOT DETECTED Final   Coronavirus HKU1 DETECTED (A) NOT DETECTED Final   Coronavirus NL63 NOT DETECTED NOT DETECTED Final   Coronavirus OC43 NOT DETECTED NOT DETECTED Final   Metapneumovirus NOT DETECTED NOT DETECTED Final   Rhinovirus / Enterovirus NOT DETECTED NOT DETECTED Final   Influenza A NOT DETECTED NOT DETECTED Final   Influenza B NOT DETECTED NOT DETECTED Final   Parainfluenza Virus 1 NOT DETECTED NOT DETECTED Final  Parainfluenza Virus 2 NOT DETECTED NOT DETECTED Final   Parainfluenza Virus 3 NOT DETECTED NOT DETECTED Final   Parainfluenza Virus 4 NOT DETECTED NOT DETECTED Final   Respiratory Syncytial Virus NOT DETECTED NOT DETECTED Final   Bordetella pertussis NOT DETECTED NOT DETECTED Final   Chlamydophila pneumoniae NOT DETECTED NOT DETECTED Final   Mycoplasma pneumoniae NOT DETECTED NOT DETECTED Final    Comment: Performed at [facility], [place] [address], [place], [place] [address]     Scheduled Meds: . senna  1 tablet Oral BID   Continuous Infusions:  Procedures/Studies: Dg Chest 2 View  Result Date: 03/18/2016 CLINICAL DATA:  Worsening mental status. EXAM: CHEST  2 VIEW COMPARISON:  02/14/2016 FINDINGS: The heart is enlarged but stable. Moderate tortuosity, ectasia and calcification of the thoracic aorta. Severe chronic lung disease with emphysema and pulmonary fibrosis. Increased basilar density on the left suspicious for infiltrate. No pleural effusions. IMPRESSION: Underlying chronic lung disease with suspected superimposed left lower lobe infiltrate. Electronically Signed   By: Marijo Sanes M.D.   On: 03/18/2016 09:22    Shoichi Mielke, DO  [facility] Pager [phone]  If 7PM-7AM, please contact  night-coverage [email or web address] Password [email or web address] 03/19/2016, 8:59 AM   LOS: 1 day

## 2016-03-19 NOTE — Progress Notes (Signed)
Nutrition Brief Note  RD consulted via COPD gold protocol.  Chart reviewed. Pt now transitioning to comfort care.  No nutrition interventions warranted at this time.  Please re-consult as needed.   Clayton Bibles, MS, RD, LDN Pager: [phone] After Hours Pager: [phone]

## 2016-03-19 NOTE — Progress Notes (Signed)
[facility] Liaison:  RN  Received request from Butch Penny, [place], of family interest in [facility].  Chart being reviewed for BP placement.  There is no bed availability for [facility] today.  Reece Packer, CSW of same.    Thank you for the referral,  Edyth Gunnels, RN, [facility] Liaison [phone]  All hospital liaison's are now on [place].  Please feel free to call me at the number above or call hospice at [phone] after 5pm.

## 2016-03-19 NOTE — Progress Notes (Addendum)
[facility] Liaison:  RN GIP visit   This is a covered and related GIP admission from 03/18/16 with an [facility] diagnosis of Carcinoma of Right Lung by Dr. Karie Georges. Code status is DNR. Patient's PCG called [facility] in regards to fall x 2 in the 24 hours prior to coming to hospital.  Patient was having hallucinations (per Sandi, niece/PCG), AMS, and was having problems getting up after fall morning of hospitalization.  After calling [facility], Sandi initiated EMS to take patient to [facility] ED.    Visited with patient in his room.   Patient was fairly easy to arouse, pleasantly disoriented.   Patient appears in NAD at this time.  Sandi, his niece, was at bedside.   She advised that she had just talked with Dr. Carles Collet this morning and that they will be stopping all antibiotics and switching patient to comfort care.  She advised that due his quick decline in the last week, that Dr. Carles Collet suggested residential hospice.  We talked about [facility] and she advised that she would like him to go there, when the time comes.   Patient has been receiving antibiotics, all are d/c'd at this time.  Patient still has breathing treatments ordered and is being covered for pain with morphine and tramadol, PRN.  Patient has had no medication today.  The continuous infusion has also been stopped.   We will continue to follow patient while in the hospital.   Thank you,  Edyth Gunnels, RN, [facility] Liaison [phone]  All hospital liaison's are now on AMION.   Please feel free to contact me at the above number or call [phone] for hospice after 5pm.

## 2016-03-19 NOTE — Discharge Summary (Signed)
Physician Discharge Summary  Seth Hunter PJA:[id number] DOB: Jun 23, 1918 DOA: 03/18/2016  PCP: Elsie Stain, MD  Admit date: 03/18/2016 Discharge date: 03/20/2016  Admitted From: Home Disposition:  Residential Hospice      Discharge Condition: Stable CODE STATUS: FULL COMFORT Diet recommendation: Comfort feeding   Brief/Interim Summary: 83 year old male with a history of complete AV block, COPD, dementia, hypertension, and presumptive lung cancer presents with 2-3 day history of increasing weakness and confusion. The patient was recently admitted to the hospital from 02/14/2016 through 02/18/2016 at which time, the patient was found to have a left superior and inferior pubic rami fracture after a mechanical fall. The patient was also treated for acute encephalopathy secondary to UTI and acute kidney injury. He was discharged to a skilled nursing facility. However, he has returned home under the care of his niece for approximately one week prior to this admission. His niece states that the patient has continued to make but declined and has not recovered functionally or cognitively since his last hospital admission. There has not been any vomiting, diarrhea, uncontrolled pain, but the patient has continued to have mechanical falls. Upon presentation, the patient was noted to have WBC 44.6 with urinalysis showing TNTC WBC. He was started on vancomycin and cefepime for possible HCAP and UTI.  Discharge Diagnoses:  Acute metabolic encephalopathy -Secondary to infectious process -The patient has continued to have progressive cognitive and functional decline since his last hospital admission -his prognosis for meaningful recovery to his pre-morbid status of health is poor -Long discussion with the patient's niece who is his POA--she is very knowledgeable about his overall prognosis and agrees to full comfort care and transition to residential hospice  UTI -d/c abx -pt's focus of care has been  transitioned to that focused on full comfort  HCAP -d/c abx -pt's focus of care has been transitioned to that focused on full comfort  Dehydration/Failure to Thrive -d/c abx and IVF -pt's focus of care has been transitioned to that focused on full comfort  COPD -stable on RA -pt's focus of care has been transitioned to that focused on full comfort -xopenex prn dyspnea  HTN -no longer active issue --pt's focus of care has been transitioned to that focused on full comfort  Complete Heart Block -no longer active issue --pt's focus of care has been transitioned to that focused on full comfort -s/p PPM  GOC -DNR --pt's focus of care has been transitioned to that focused on full comfort -roxanol prn pain and dyspnea -ativan prn anxiety   Discharge Instructions  Discharge Instructions    Diet general    Complete by:  As directed      Allergies as of 03/20/2016      Reactions   Coricidin Hbp Cough-cold [chlorpheniramine-dm] Other (See Comments)   Sedation.    Penicillins Other (See Comments)   Has patient had a PCN reaction causing immediate rash, facial/tongue/throat swelling, SOB or lightheadedness with hypotension: No Has patient had a PCN reaction causing severe rash involving mucus membranes or skin necrosis: No Has patient had a PCN reaction that required hospitalization No Has patient had a PCN reaction occurring within the last 10 years: No If all of the above answers are "NO", then may proceed with Cephalosporin use.      Medication List    STOP taking these medications   bacitracin 500 UNIT/GM ointment   benzonatate 100 MG capsule Commonly known as:  TESSALON   BIOFREEZE 4 % Gel Generic drug:  Menthol (  Topical Analgesic)   bisacodyl 10 MG suppository Commonly known as:  DULCOLAX   docusate sodium 100 MG capsule Commonly known as:  COLACE   finasteride 5 MG tablet Commonly known as:  PROSCAR   senna 8.6 MG Tabs tablet Commonly known as:   SENOKOT   simethicone 80 MG chewable tablet Commonly known as:  MYLICON   traMADol 50 MG tablet Commonly known as:  ULTRAM     TAKE these medications   LORazepam 2 MG/ML concentrated solution Commonly known as:  ATIVAN Take 0.5 mLs (1 mg total) by mouth every 4 (four) hours as needed for anxiety.   morphine CONCENTRATE 10 MG/0.5ML Soln concentrated solution Take 0.2 mLs (4 mg total) by mouth every 2 (two) hours as needed for severe pain or shortness of breath.       Allergies  Allergen Reactions  . Coricidin Hbp Cough-Cold [Chlorpheniramine-Dm] Other (See Comments)    Sedation.   Marland Kitchen Penicillins Other (See Comments)    Has patient had a PCN reaction causing immediate rash, facial/tongue/throat swelling, SOB or lightheadedness with hypotension: No Has patient had a PCN reaction causing severe rash involving mucus membranes or skin necrosis: No Has patient had a PCN reaction that required hospitalization No Has patient had a PCN reaction occurring within the last 10 years: No If all of the above answers are "NO", then may proceed with Cephalosporin use.    Consultations:  none   Procedures/Studies: Dg Chest 2 View  Result Date: 03/18/2016 CLINICAL DATA:  Worsening mental status. EXAM: CHEST  2 VIEW COMPARISON:  02/14/2016 FINDINGS: The heart is enlarged but stable. Moderate tortuosity, ectasia and calcification of the thoracic aorta. Severe chronic lung disease with emphysema and pulmonary fibrosis. Increased basilar density on the left suspicious for infiltrate. No pleural effusions. IMPRESSION: Underlying chronic lung disease with suspected superimposed left lower lobe infiltrate. Electronically Signed   By: Marijo Sanes M.D.   On: 03/18/2016 09:22        Discharge Exam: Vitals:   03/19/16 2009 03/20/16 0522  BP: 130/71 115/70  Pulse: 84 60  Resp: 14 16  Temp: 98.1 F (36.7 C) 97.8 F (36.6 C)   Vitals:   03/19/16 0648 03/19/16 1347 03/19/16 2009 03/20/16 0522    BP: 120/60 125/62 130/71 115/70  Pulse: 70 82 84 60  Resp: '16 16 14 16  '$ Temp: 98.5 F (36.9 C) 98.6 F (37 C) 98.1 F (36.7 C) 97.8 F (36.6 C)  TempSrc: Axillary Axillary Axillary Axillary  SpO2: 95% 95% 96% 97%  Weight:        General: Pt is alert, awake, not in acute distress Cardiovascular: RRR, S1/S2 +, no rubs, no gallops Respiratory: CTA bilaterally, no wheezing, no rhonchi Abdominal: Soft, NT, ND, bowel sounds + Extremities: no edema, no cyanosis   The results of significant diagnostics from this hospitalization (including imaging, microbiology, ancillary and laboratory) are listed below for reference.    Significant Diagnostic Studies: Dg Chest 2 View  Result Date: 03/18/2016 CLINICAL DATA:  Worsening mental status. EXAM: CHEST  2 VIEW COMPARISON:  02/14/2016 FINDINGS: The heart is enlarged but stable. Moderate tortuosity, ectasia and calcification of the thoracic aorta. Severe chronic lung disease with emphysema and pulmonary fibrosis. Increased basilar density on the left suspicious for infiltrate. No pleural effusions. IMPRESSION: Underlying chronic lung disease with suspected superimposed left lower lobe infiltrate. Electronically Signed   By: Marijo Sanes M.D.   On: 03/18/2016 09:22     Microbiology: Recent Results (from  the past 240 hour(s))  Urine culture     Status: Abnormal (Preliminary result)   Collection Time: 03/18/16  8:35 AM  Result Value Ref Range Status   Specimen Description URINE, CATHETERIZED  Final   Special Requests NONE  Final   Culture 80,000 COLONIES/mL PROTEUS MIRABILIS (A)  Final   Report Status PENDING  Incomplete  Culture, blood (Routine X 2) w Reflex to ID Panel     Status: None (Preliminary result)   Collection Time: 03/18/16 10:29 AM  Result Value Ref Range Status   Specimen Description BLOOD RIGHT HAND  Final   Special Requests IN PEDIATRIC BOTTLE 1 ML  Final   Culture   Final    NO GROWTH 1 DAY Performed at [facility], [place] [address] [place], [place] [address]    Report Status PENDING  Incomplete  Culture, blood (Routine X 2) w Reflex to ID Panel     Status: None (Preliminary result)   Collection Time: 03/18/16 12:37 PM  Result Value Ref Range Status   Specimen Description BLOOD RIGHT ARM  Final   Special Requests BOTTLES DRAWN AEROBIC AND ANAEROBIC 5 ML  Final   Culture   Final    NO GROWTH < 24 HOURS Performed at [facility], [place] [address] [place], [place] [address]    Report Status PENDING  Incomplete  Respiratory Panel by PCR     Status: Abnormal   Collection Time: 03/18/16  1:16 PM  Result Value Ref Range Status   Adenovirus NOT DETECTED NOT DETECTED Final   Coronavirus 229E NOT DETECTED NOT DETECTED Final   Coronavirus HKU1 DETECTED (A) NOT DETECTED Final   Coronavirus NL63 NOT DETECTED NOT DETECTED Final   Coronavirus OC43 NOT DETECTED NOT DETECTED Final   Metapneumovirus NOT DETECTED NOT DETECTED Final   Rhinovirus / Enterovirus NOT DETECTED NOT DETECTED Final   Influenza A NOT DETECTED NOT DETECTED Final   Influenza B NOT DETECTED NOT DETECTED Final   Parainfluenza Virus 1 NOT DETECTED NOT DETECTED Final   Parainfluenza Virus 2 NOT DETECTED NOT DETECTED Final   Parainfluenza Virus 3 NOT DETECTED NOT DETECTED Final   Parainfluenza Virus 4 NOT DETECTED NOT DETECTED Final   Respiratory Syncytial Virus NOT DETECTED NOT DETECTED Final   Bordetella pertussis NOT DETECTED NOT DETECTED Final   Chlamydophila pneumoniae NOT DETECTED NOT DETECTED Final   Mycoplasma pneumoniae NOT DETECTED NOT DETECTED Final    Comment: Performed at [facility], [place] [address], [place], [place] [address]  Culture, blood (routine x 2) Call MD if unable to obtain prior to antibiotics being given     Status: None (Preliminary result)   Collection Time: 03/18/16  2:11 PM  Result Value Ref Range Status   Specimen Description BLOOD RIGHT ANTECUBITAL  Final   Special Requests IN PEDIATRIC BOTTLE 2 CC   Final   Culture   Final    NO GROWTH < 24 HOURS Performed at [facility], [place] [address], [place], [place] [address]    Report Status PENDING  Incomplete  Culture, blood (routine x 2) Call MD if unable to obtain prior to antibiotics being given     Status: None (Preliminary result)   Collection Time: 03/18/16  2:11 PM  Result Value Ref Range Status   Specimen Description BLOOD LEFT ANTECUBITAL  Final   Special Requests IN PEDIATRIC BOTTLE 3CC  Final   Culture   Final    NO GROWTH <  24 HOURS Performed at [facility], [place] [address] [place], [place] [address]    Report Status PENDING  Incomplete     Labs: Basic Metabolic Panel:  Recent Labs Lab 03/18/16 0940 03/18/16 1411 03/19/16 0123  NA 142  --  142  K 4.3  --  3.7  CL 107  --  110  CO2 29  --  27  GLUCOSE 110*  --  89  BUN 23*  --  18  CREATININE 0.87  --  0.86  CALCIUM 9.5  --  8.8*  MG  --  1.8  --   PHOS  --  2.6  --    Liver Function Tests:  Recent Labs Lab 03/18/16 0940 03/19/16 0123  AST 31 30  ALT 19 19  ALKPHOS 140* 119  BILITOT 0.4 0.7  PROT 6.8 6.2*  ALBUMIN 3.3* 2.8*   No results for input(s): LIPASE, AMYLASE in the last 168 hours. No results for input(s): AMMONIA in the last 168 hours. CBC:  Recent Labs Lab 03/18/16 0940 03/19/16 0123  WBC 44.6* 40.3*  NEUTROABS 6.2  --   HGB 13.6 11.3*  HCT 41.1 34.6*  MCV 96.0 94.5  PLT 322 295   Cardiac Enzymes:  Recent Labs Lab 03/18/16 1411 03/18/16 1817 03/19/16 0123  TROPONINI 0.05* 0.04* 0.05*   BNP: Invalid input(s): POCBNP CBG:  Recent Labs Lab 03/19/16 0743  GLUCAP 95    Time coordinating discharge:  Greater than 30 minutes  Signed:  Scotti Kosta, DO [facility] Pager: [phone] 03/20/2016, 10:02 AM

## 2016-03-19 NOTE — Clinical Social Work Note (Signed)
CSW received call from Dr. Carles Collet this morning that patient is being transitioned to comfort care and family is now agreeable to residential hospice.  Patient is currently being followed by [facility] and was admitted under Hospice services.  CSW spoke with patient's  Niece Fanny Bien and offered choice for residential hospice services. She prefers [facility].  CSW spoke to Edyth Gunnels, [employer] who indicated that she anticipates a bed at [facility] and will have Erling Conte, LCSW follow up with weekday SW staff re: possible vacancy tomorrow.  Niece indicated if there was no way for patient to go to [facility]- her next choice would be [facility].  Lorie Phenix. Pauline Good, [place] (weekend coverage)

## 2016-03-20 ENCOUNTER — Telehealth: Payer: Self-pay

## 2016-03-20 DIAGNOSIS — N390 Urinary tract infection, site not specified: Secondary | ICD-10-CM

## 2016-03-20 LAB — URINE CULTURE

## 2016-03-20 MED ORDER — LIP MEDEX EX OINT
TOPICAL_OINTMENT | CUTANEOUS | Status: AC
Start: 2016-03-20 — End: 2016-03-20
  Administered 2016-03-20: 1
  Filled 2016-03-20: qty 7

## 2016-03-20 NOTE — Progress Notes (Signed)
Pt / niece are in agreement with d/c to [facility] today. EMS transport is required. Medical necessity form completed. D/C Summary sent to SNF for review. Scripts included in [employer]. # for report p[rovided to nsg.  Werner Lean LCSW [phone]

## 2016-03-20 NOTE — Telephone Encounter (Signed)
PLEASE NOTE: All timestamps contained within this report are represented as [place] Standard Time. CONFIDENTIALTY NOTICE: This fax transmission is intended only for the addressee. It contains information that is legally privileged, confidential or otherwise protected from use or disclosure. If you are not the intended recipient, you are strictly prohibited from reviewing, disclosing, copying using or disseminating any of this information or taking any action in reliance on or regarding this information. If you have received this fax in error, please notify us immediately by telephone so that we can arrange for its return to [place]. Phone: [phone], Toll-Free: [phone], Fax: [phone] Page: 1 of 1 Call Id: [id number] [place] Night - Client Nonclinical Telephone Record [place] Night - Client Client Site [facility] Physician Renford Dills - MD Contact Type Call Who Is Calling Physician / Provider / Hospital Call Type Provider Call Message Only Reason for Call Request to send message to Office Initial Comment [facility] wanted to notify DR Damita Dunnings that his PT Seth Hunter DOB- 1918-02-08 has been admitted to [facility] in [place] [phone] Call Closed By: [place] Transaction Date/Time: 03/18/2016 4:12:49 PM (ET)

## 2016-03-20 NOTE — Progress Notes (Signed)
Gave report to [place] and Arbie Cookey RN's at [facility].  Patient transferred with [facility] and medicated with morphine prior to transfer.

## 2016-03-20 NOTE — Telephone Encounter (Signed)
Noted. Thanks.

## 2016-03-20 NOTE — Progress Notes (Signed)
[facility] Liaison Note:  Plan is for patient to transfer to [facility] this morning.  [facility]  Social worker, Arts development officer and [place] aware.  Please use [facility] for ambulance transport.  Please fax over completed discharge summary to [phone].  RN please call report to [phone].  Thank you, Freddi Starr RN, [facility] Liaison [phone]

## 2016-03-23 LAB — CULTURE, BLOOD (ROUTINE X 2)
CULTURE: NO GROWTH
Culture: NO GROWTH
Culture: NO GROWTH
Culture: NO GROWTH

## 2016-04-08 NOTE — Telephone Encounter (Signed)
Spoke to pt niece. Pt is in [employer] at [facility]

## 2016-04-08 NOTE — Telephone Encounter (Signed)
LVM for pt to call back and schedule AWV + labs with Lesia and CPE with PCP. °

## 2016-04-30 ENCOUNTER — Telehealth: Payer: Self-pay | Admitting: Family Medicine

## 2016-04-30 NOTE — Telephone Encounter (Signed)
I called his niece [name] just to check on her. I didn't have any news to offer. I was mainly calling to offer my support for her and for the patient.  She thanked me for the call. He is getting good care at hospice. I appreciate the help of all involved. She thanked me for the call.

## 2016-06-12 ENCOUNTER — Telehealth: Payer: Self-pay | Admitting: Family Medicine

## 2016-06-12 NOTE — Telephone Encounter (Signed)
Called pt's niece to offer my condolences and thank her for her effort.  LMOVM.  I was glad to see this kind gentleman in the clinic.

## 2016-06-16 DIAGNOSIS — 419620001 Death: Secondary | SNOMED CT | POA: Diagnosis not present

## 2016-06-16 DEATH — deceased

## 2018-09-28 IMAGING — CR DG HIP (WITH OR WITHOUT PELVIS) 2-3V*L*
4 series · 4 of 4 positions shown · non-contrast
Comparison: None.

CLINICAL DATA: Fall

EXAM:
DG HIP (WITH OR WITHOUT PELVIS) 2-3V LEFT

[x pelvis]
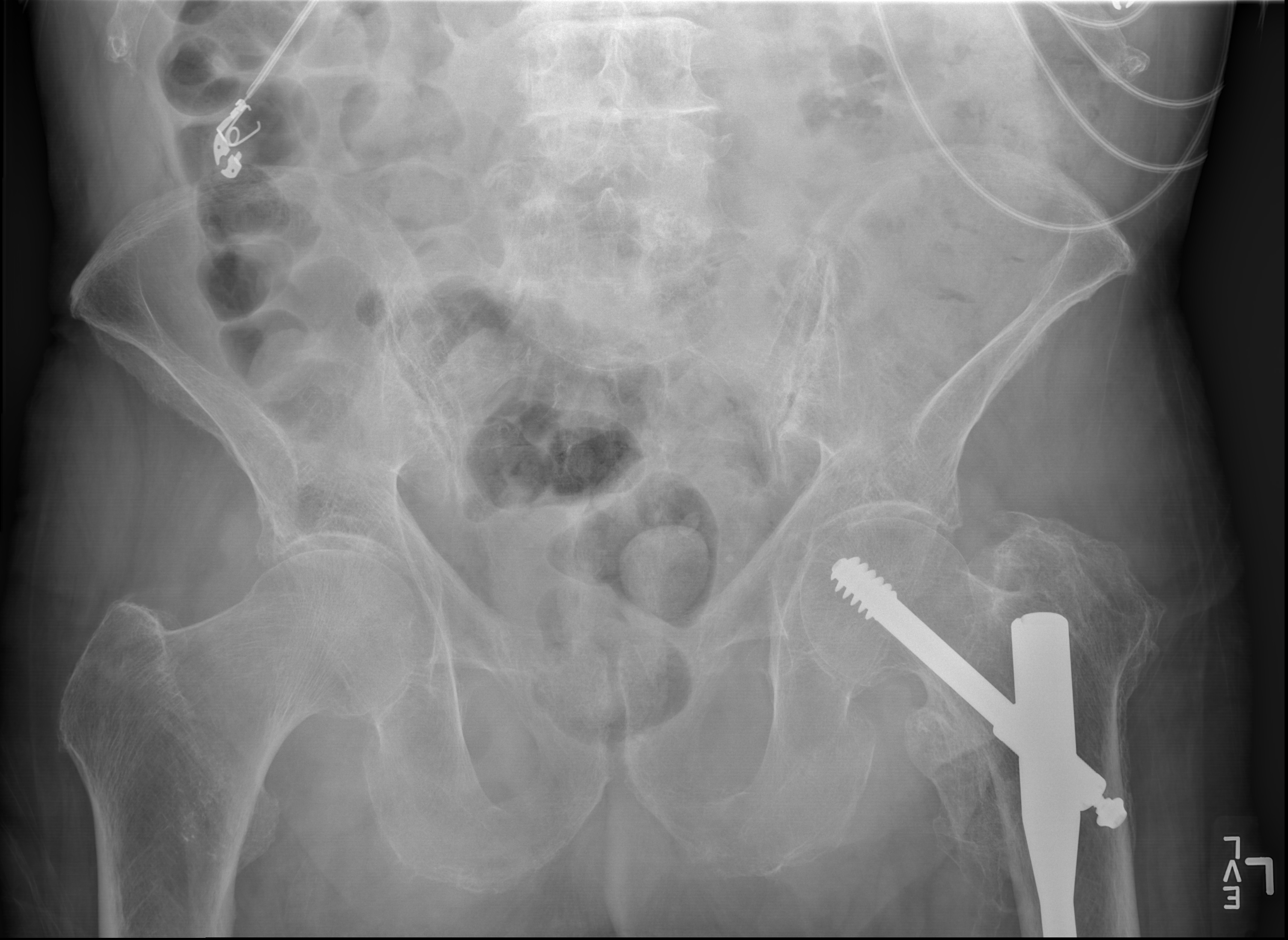

[x hip ap left]
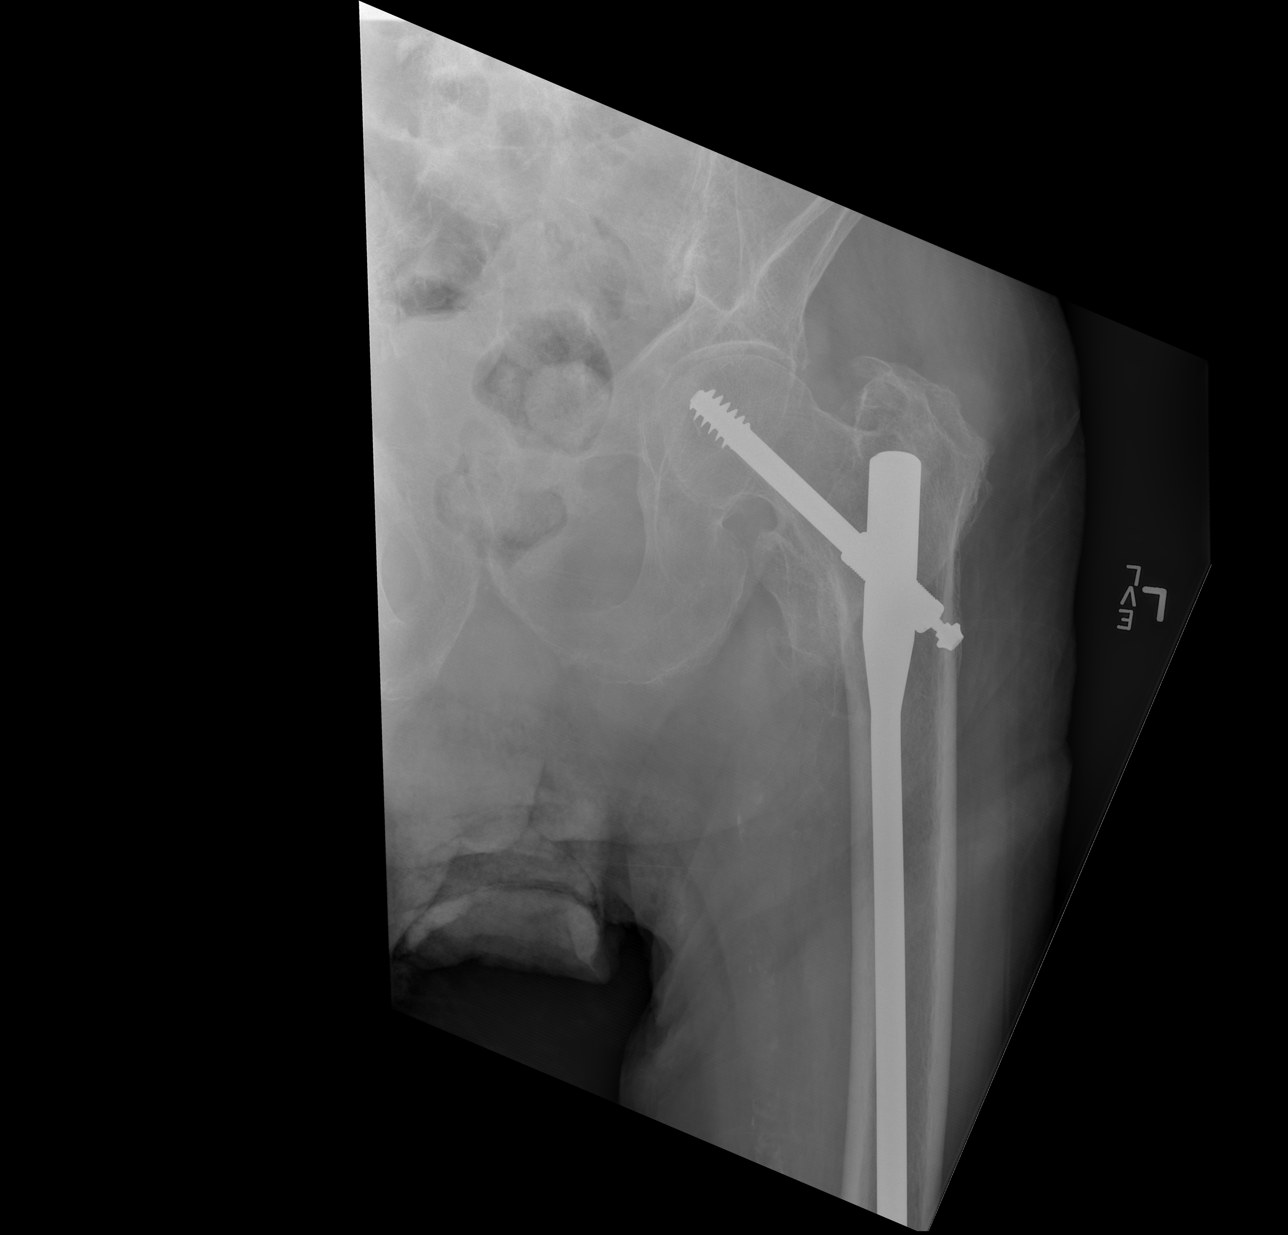

[w hip lat left (1 of 2)]
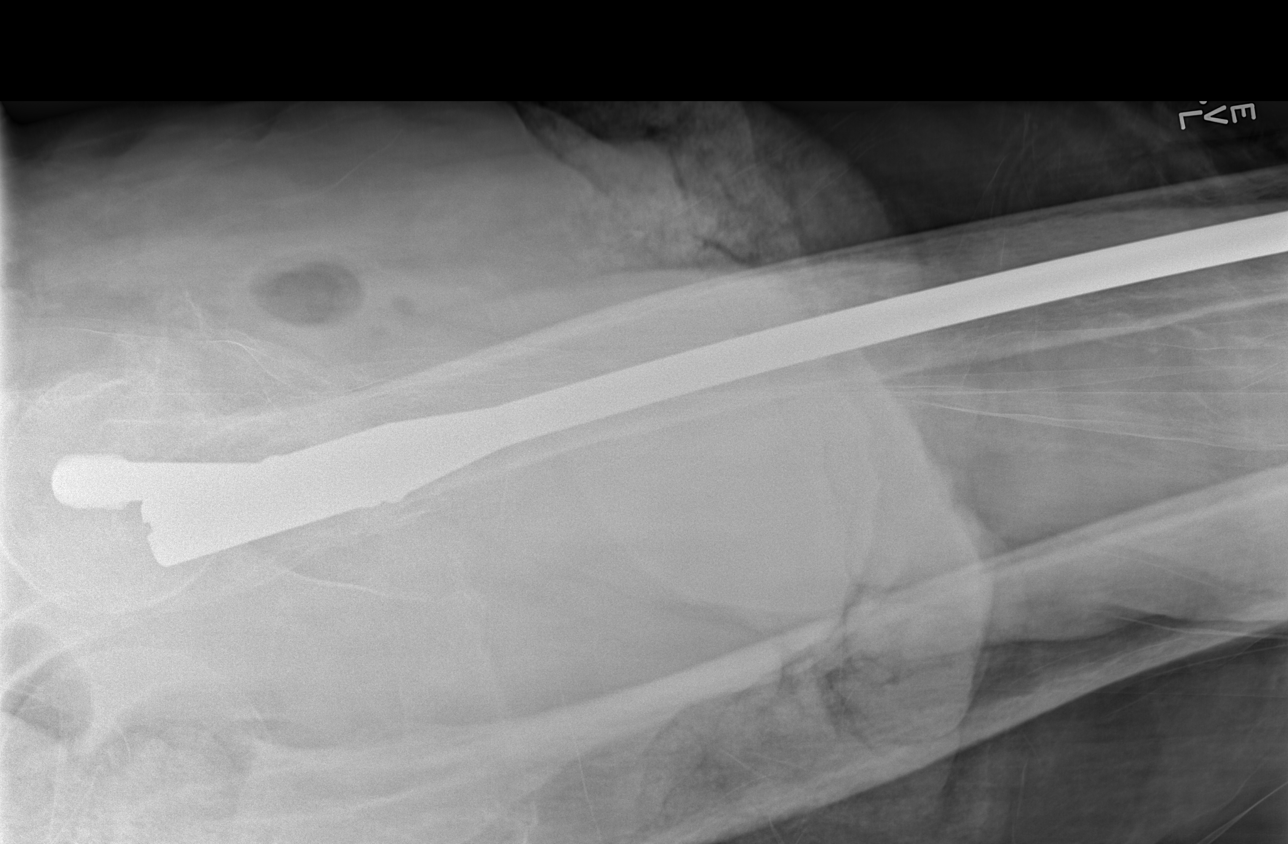

[w hip lat left (2 of 2)]
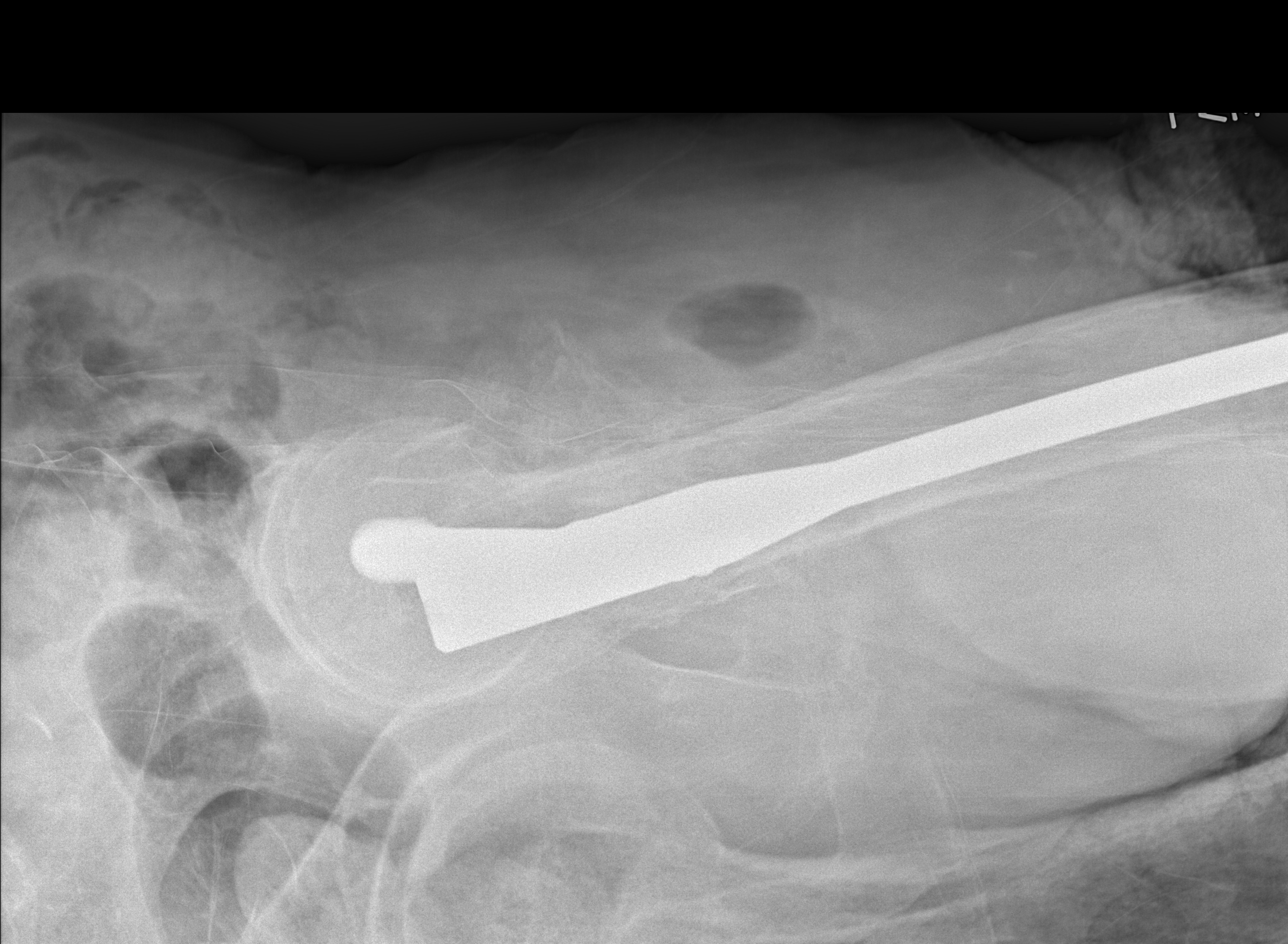

[4 of 4 positions shown; findings below may reference images not displayed]

FINDINGS: Dynamic compression screw and rod are present in the proximal left
femur. No acute fracture. No dislocation. Osteopenia. Degenerative
changes in the lower lumbar spine. No breakage or loosening of the
hardware.
IMPRESSION: No acute bony pathology. Chronic and postoperative changes are
noted.

## 2018-09-28 IMAGING — CR DG CHEST 2V
2 series · 2 of 2 positions shown · non-contrast
Comparison: 01/07/2013

CLINICAL DATA: Weakness.  Fall today.

EXAM:
CHEST  2 VIEW

[w chest lat]
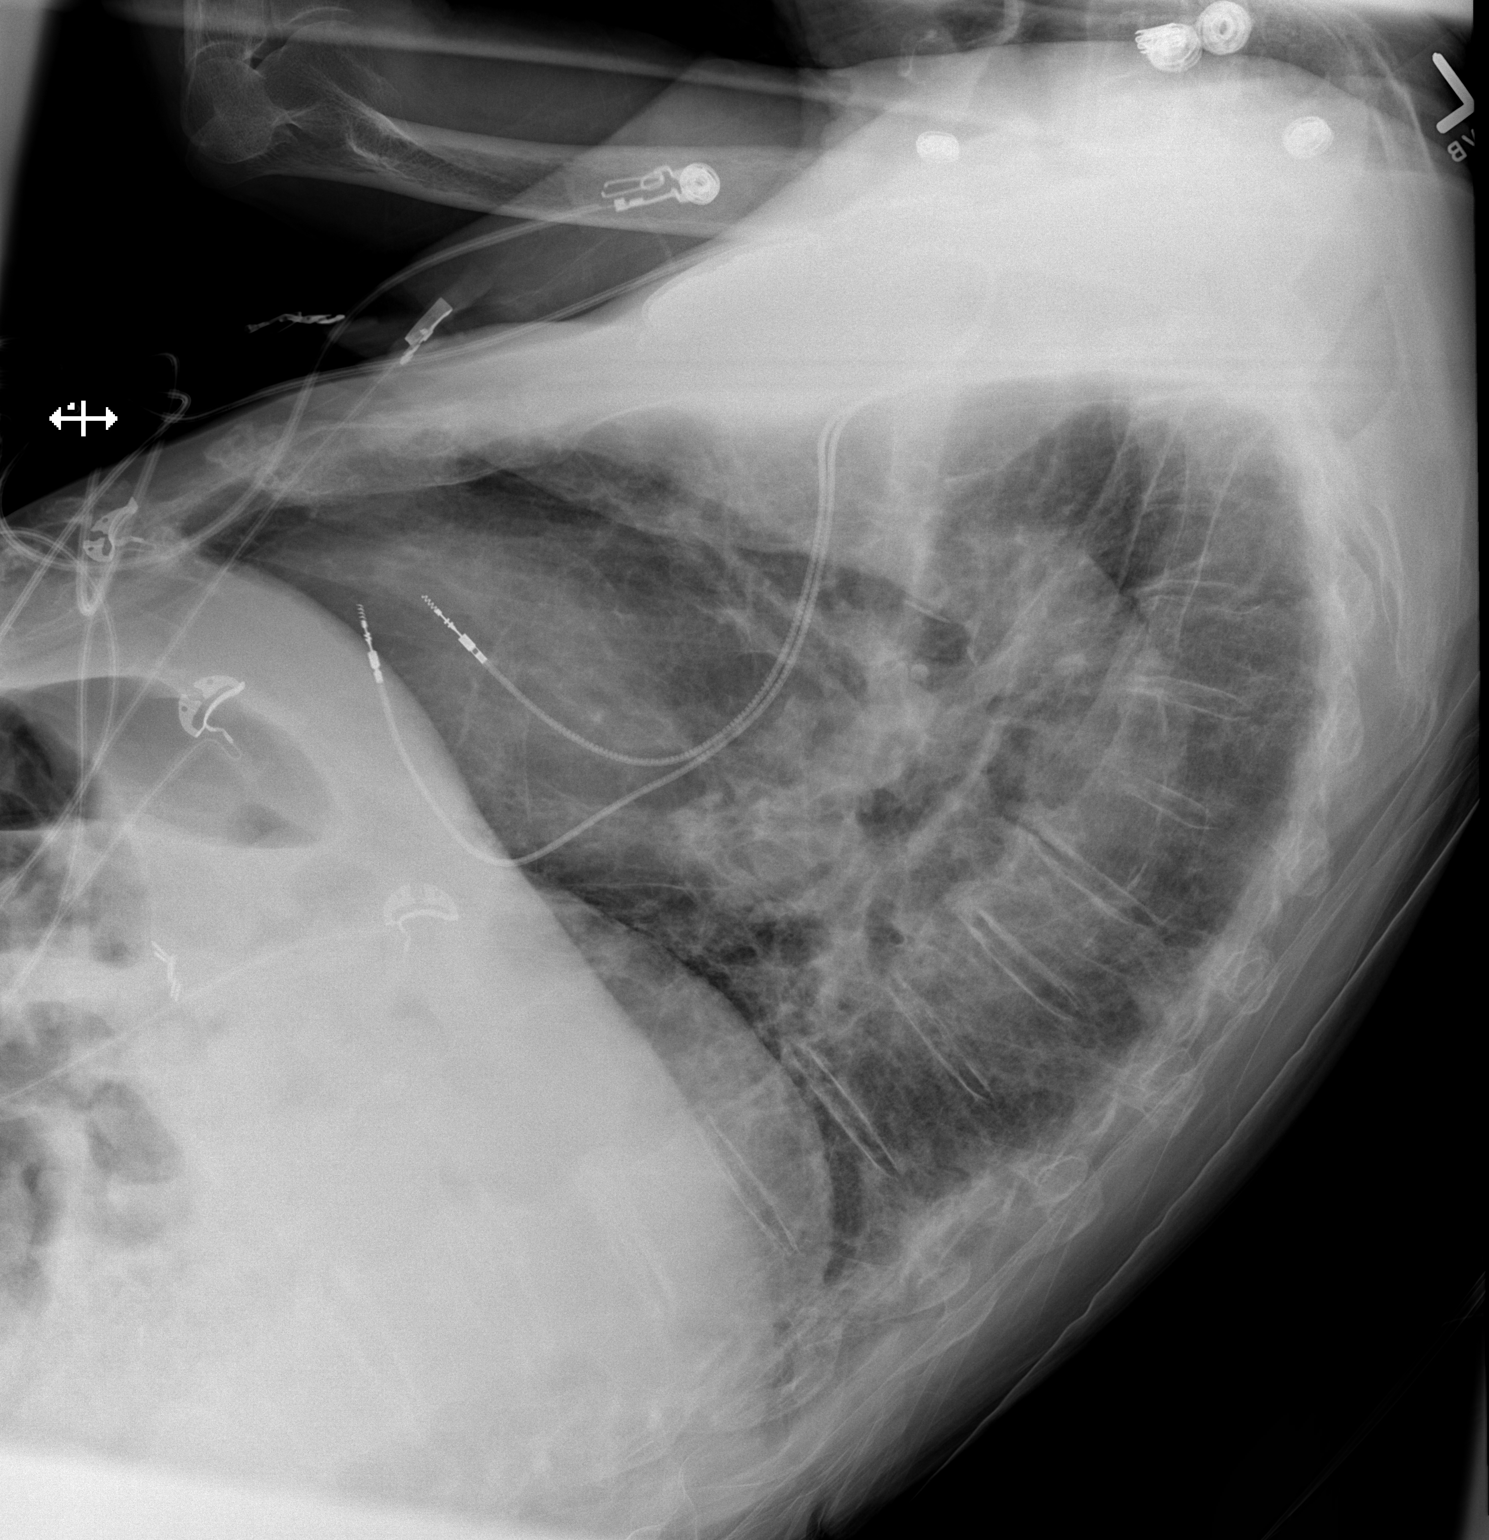

[x chest ap]
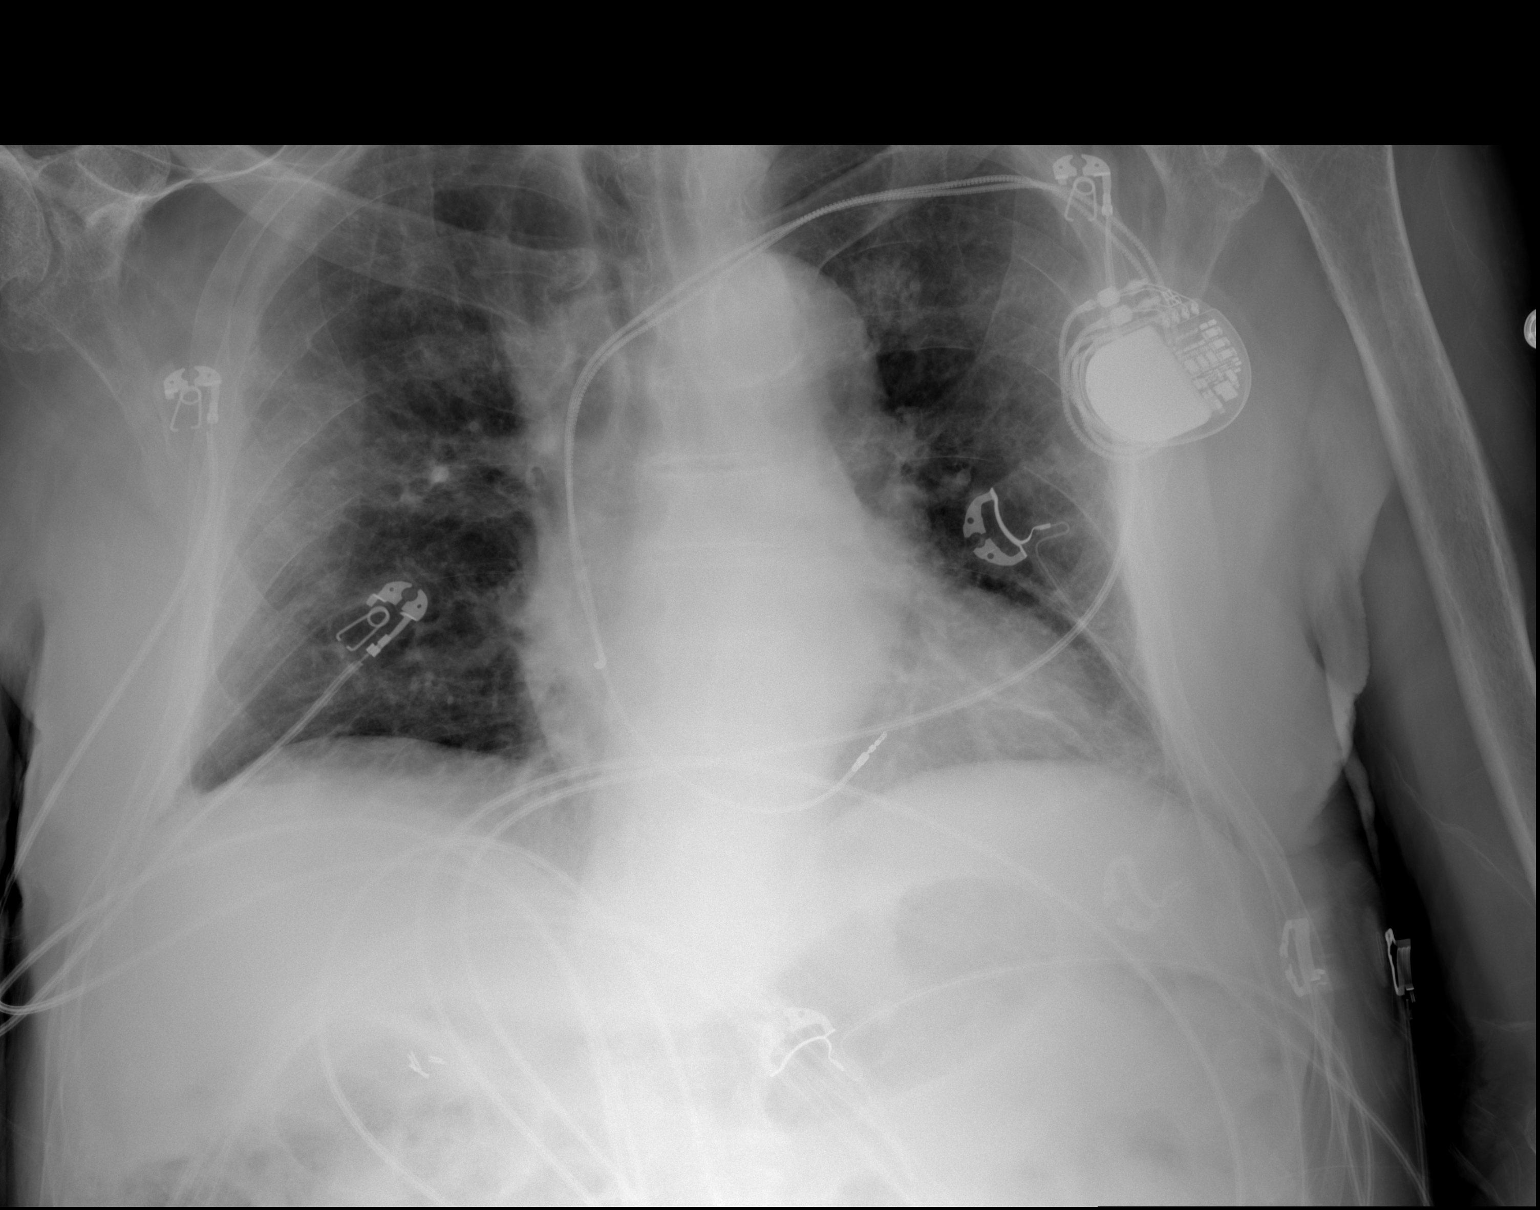

[2 of 2 positions shown; findings below may reference images not displayed]

FINDINGS: Normal heart size. Double lead left subclavian pacemaker device and
leads are stable and intact. Tips are in the right atrium and right
ventricle. No pneumothorax or pleural effusion. Lungs are very under
aerated and there is scattered atelectasis.
IMPRESSION: Low volumes and scattered atelectasis.

## 2018-09-28 IMAGING — CT CT FEMUR *L* W/O CM
3 series · 16 of 33 positions shown, 19 images · non-contrast
Comparison: LEFT hip radiographs February 14, 2016 at 4155 hours.

CLINICAL DATA: LEFT hip pain after fall 3 days ago. Altered mental
status, history of dementia.

EXAM:
CT OF THE LOWER LEFT EXTREMITY WITHOUT CONTRAST
TECHNIQUE: Multidetector CT imaging of the lower left extremity was performed
according to the standard protocol.

[Series 5: extremity 3.0 b30s · axial · 0.48mm/px · z∈[+731,+1193]mm · 8 of 184 slices shown, 10 images]
[im 15/184  soft-tissue]
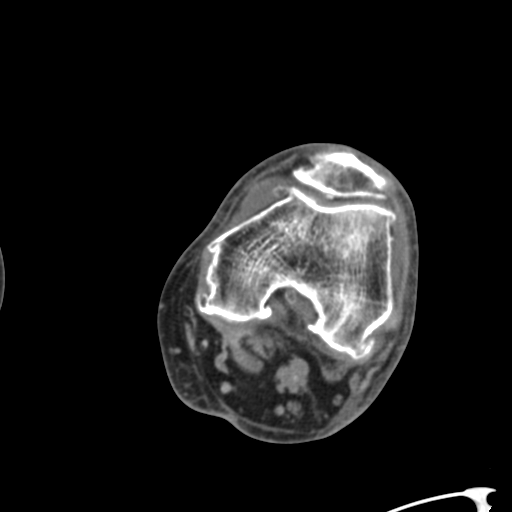
[im 15/184  bone]
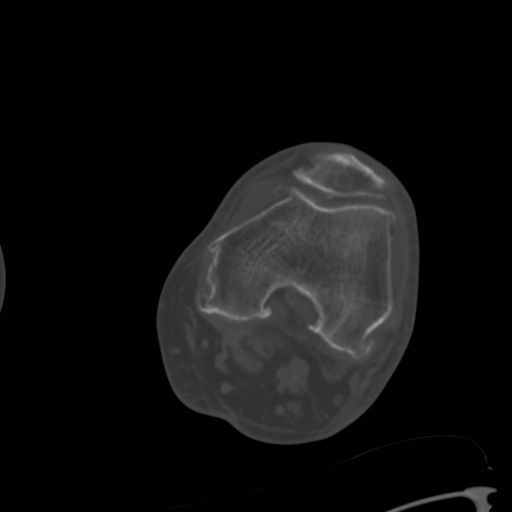
[im 43/184  bone]
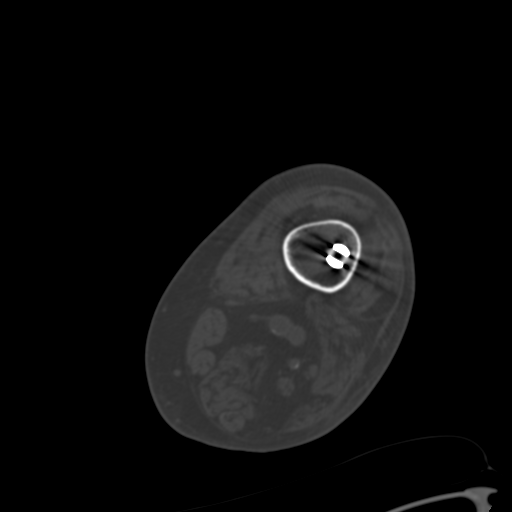
[im 57/184  bone]
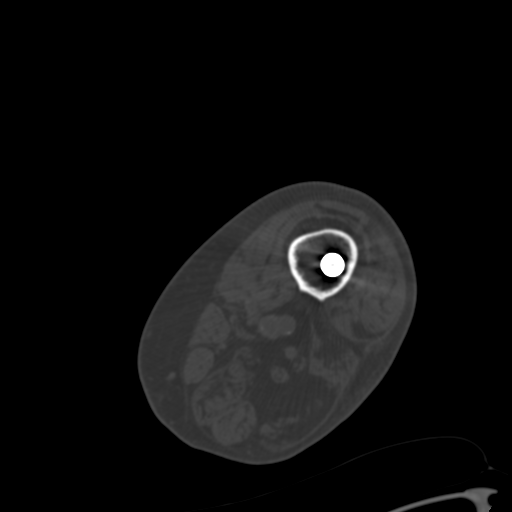
[im 85/184  bone]
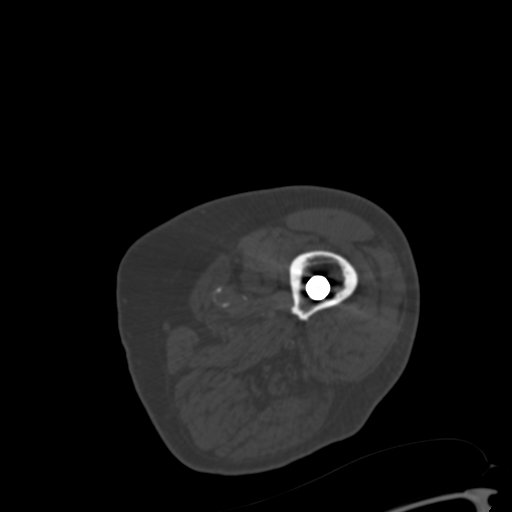
[im 99/184  soft-tissue]
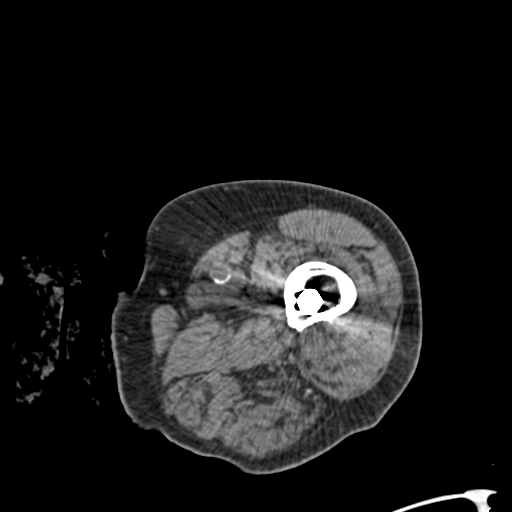
[im 99/184  bone]
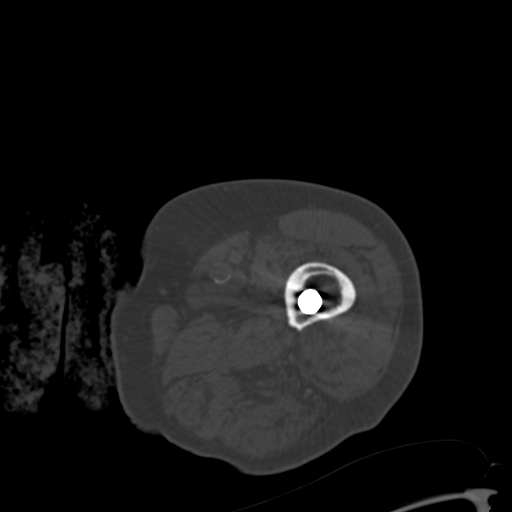
[im 127/184  bone]
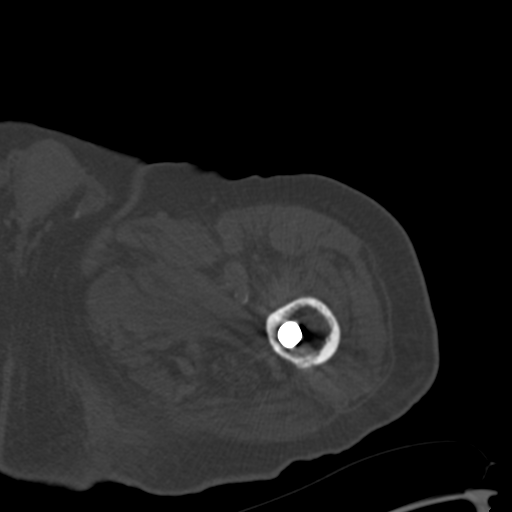
[im 141/184  bone]
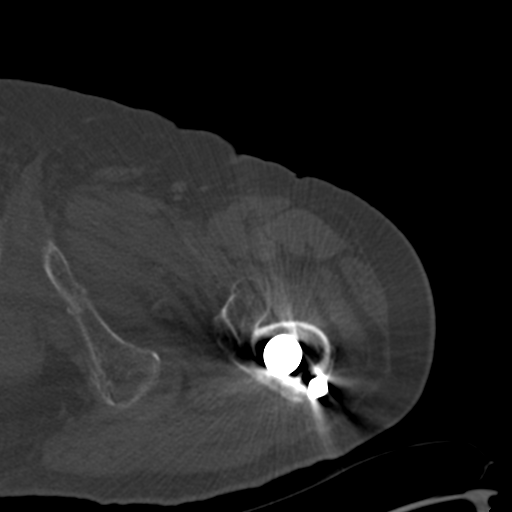
[im 169/184  bone]
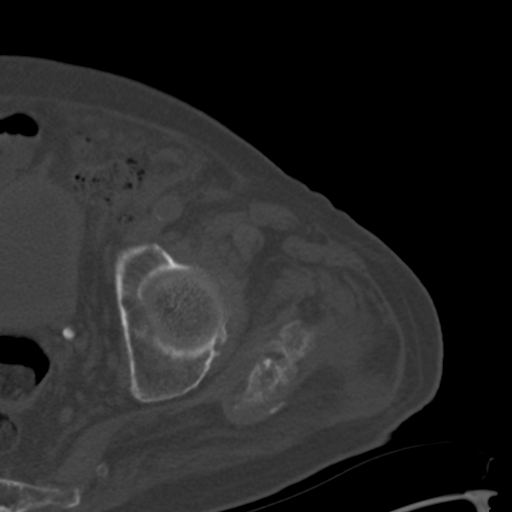

[Series 602: sag left femur · sagittal · 1.08mm/px · 5 of 201 slices shown, 6 images]
[im 67/201  bone]
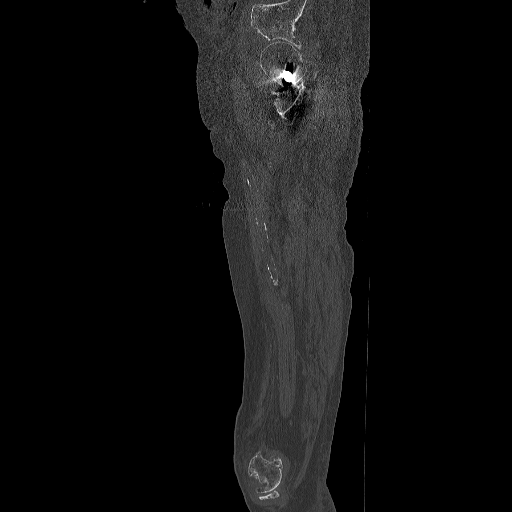
[im 84/201  bone]
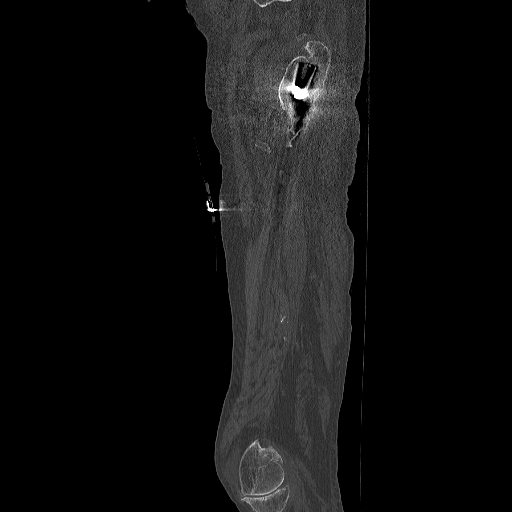
[im 101/201  soft-tissue]
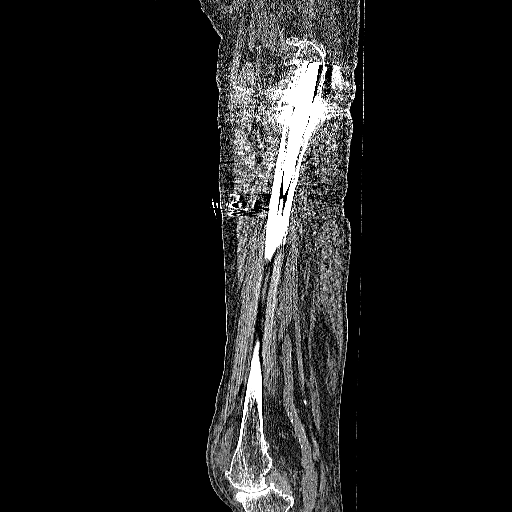
[im 101/201  bone]
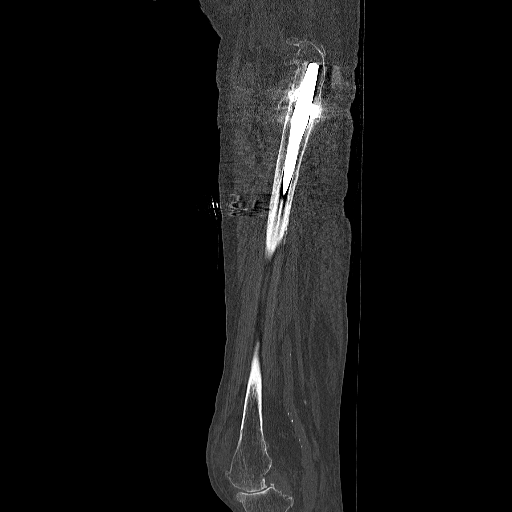
[im 117/201  bone]
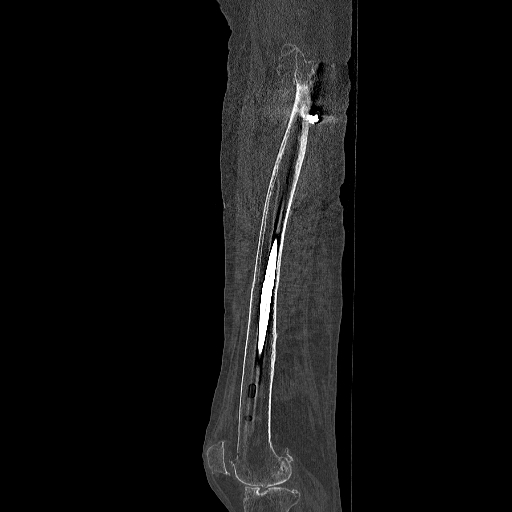
[im 134/201  bone]
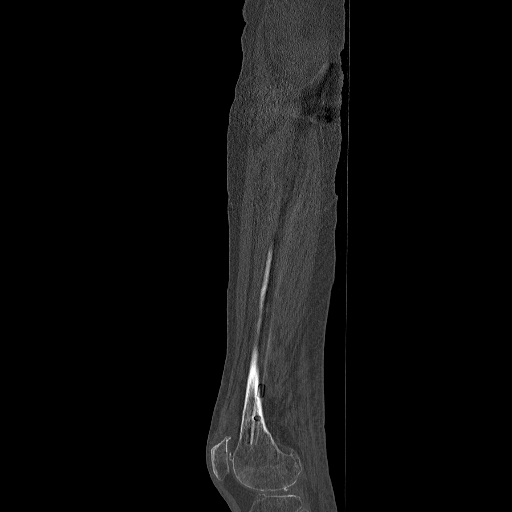

[Series 603: cor left femur · coronal · 1.08mm/px · 3 of 171 slices shown]
[im 35/171  bone]
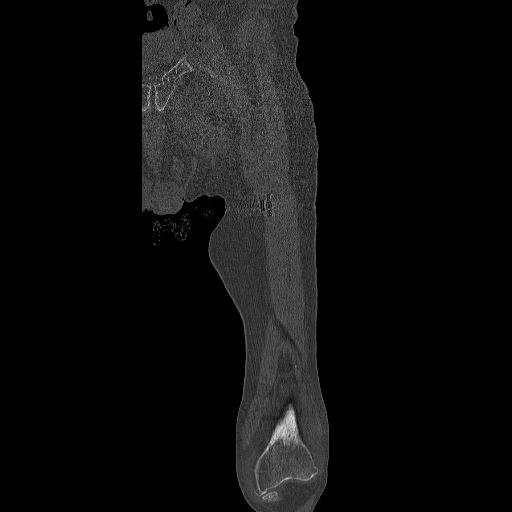
[im 69/171  bone]
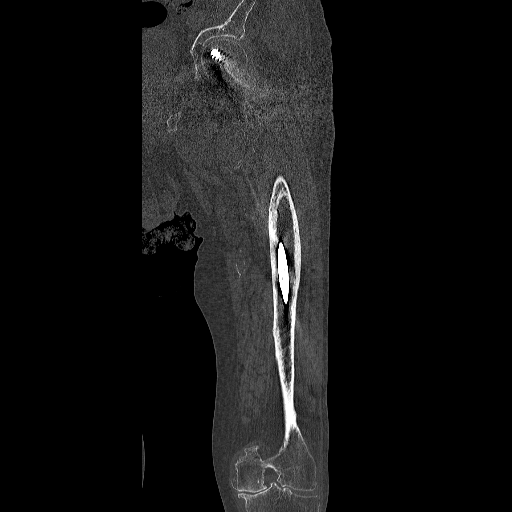
[im 103/171  bone]
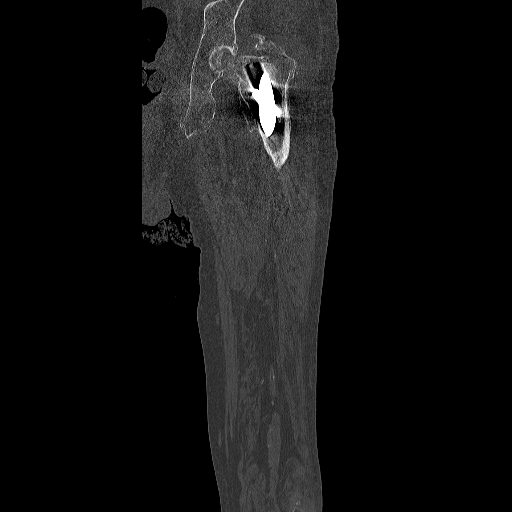

[16 of 33 positions shown; findings below may reference images not displayed]

FINDINGS: Bones/Joint/Cartilage

Acute nondisplaced LEFT superior and inferior pubic rami fractures
extending to the pubis. LEFT femur is intact with intramedullary rod
and screw fixation, hardware is intact. Old LEFT intertrochanteric
fracture. No dislocation. No periprosthetic lucency. Osteopenia.
Severe degenerative change of the LEFT knee.

Ligaments

Suboptimally assessed by CT.

Muscles and Tendons

LEFT gluteal hematoma. Thickened LEFT pectineus and adductor/
obturator externus muscles hematoma. No subcutaneous gas or
radiopaque foreign bodies. Mild atherosclerosis.

Soft tissues

Small fat containing LEFT inguinal hernia. Partially imaged
prostatomegaly.
IMPRESSION: Acute nondisplaced LEFT superior and inferior pubic rami fractures.
LEFT hip intramuscular hematomas.

LEFT femur ORIF without hardware failure.

Osteopenia.
